# Patient Record
Sex: Male | Born: 1935 | Race: White | Hispanic: No | Marital: Married | Smoking: Never smoker
Health system: Southern US, Community
[De-identification: ages and names within clinical notes are randomized; demographics above are authoritative.]

## PROBLEM LIST (undated history)

## (undated) DIAGNOSIS — Z87438 Personal history of other diseases of male genital organs: Secondary | ICD-10-CM

## (undated) DIAGNOSIS — N281 Cyst of kidney, acquired: Secondary | ICD-10-CM

## (undated) DIAGNOSIS — I2510 Atherosclerotic heart disease of native coronary artery without angina pectoris: Secondary | ICD-10-CM

## (undated) DIAGNOSIS — I480 Paroxysmal atrial fibrillation: Secondary | ICD-10-CM

## (undated) DIAGNOSIS — E785 Hyperlipidemia, unspecified: Secondary | ICD-10-CM

## (undated) DIAGNOSIS — I639 Cerebral infarction, unspecified: Secondary | ICD-10-CM

## (undated) DIAGNOSIS — H8309 Labyrinthitis, unspecified ear: Secondary | ICD-10-CM

## (undated) DIAGNOSIS — F3289 Other specified depressive episodes: Secondary | ICD-10-CM

## (undated) DIAGNOSIS — M1710 Unilateral primary osteoarthritis, unspecified knee: Secondary | ICD-10-CM

## (undated) DIAGNOSIS — F329 Major depressive disorder, single episode, unspecified: Secondary | ICD-10-CM

## (undated) DIAGNOSIS — I10 Essential (primary) hypertension: Secondary | ICD-10-CM

## (undated) DIAGNOSIS — IMO0002 Reserved for concepts with insufficient information to code with codable children: Secondary | ICD-10-CM

## (undated) DIAGNOSIS — K219 Gastro-esophageal reflux disease without esophagitis: Secondary | ICD-10-CM

## (undated) DIAGNOSIS — R51 Headache: Secondary | ICD-10-CM

## (undated) DIAGNOSIS — C342 Malignant neoplasm of middle lobe, bronchus or lung: Secondary | ICD-10-CM

## (undated) DIAGNOSIS — R519 Headache, unspecified: Secondary | ICD-10-CM

## (undated) HISTORY — DX: Essential (primary) hypertension: I10

## (undated) HISTORY — DX: Atherosclerotic heart disease of native coronary artery without angina pectoris: I25.10

## (undated) HISTORY — PX: CATARACT EXTRACTION, BILATERAL: SHX1313

## (undated) HISTORY — PX: INGUINAL HERNIA REPAIR: SHX194

## (undated) HISTORY — DX: Major depressive disorder, single episode, unspecified: F32.9

## (undated) HISTORY — DX: Unilateral primary osteoarthritis, unspecified knee: M17.10

## (undated) HISTORY — PX: OTHER SURGICAL HISTORY: SHX169

## (undated) HISTORY — DX: Personal history of other diseases of male genital organs: Z87.438

## (undated) HISTORY — DX: Gastro-esophageal reflux disease without esophagitis: K21.9

## (undated) HISTORY — DX: Paroxysmal atrial fibrillation: I48.0

## (undated) HISTORY — PX: EYE SURGERY: SHX253

## (undated) HISTORY — DX: Labyrinthitis, unspecified ear: H83.09

## (undated) HISTORY — PX: BUNIONECTOMY: SHX129

## (undated) HISTORY — DX: Malignant neoplasm of middle lobe, bronchus or lung: C34.2

## (undated) HISTORY — DX: Hyperlipidemia, unspecified: E78.5

## (undated) HISTORY — DX: Reserved for concepts with insufficient information to code with codable children: IMO0002

## (undated) HISTORY — DX: Other specified depressive episodes: F32.89

---

## 1998-05-07 ENCOUNTER — Encounter: Payer: Self-pay | Admitting: Emergency Medicine

## 1998-05-07 ENCOUNTER — Observation Stay (HOSPITAL_COMMUNITY): Admission: EM | Admit: 1998-05-07 | Discharge: 1998-05-08 | Payer: Self-pay | Admitting: Emergency Medicine

## 2002-11-21 ENCOUNTER — Encounter: Payer: Self-pay | Admitting: Gastroenterology

## 2002-11-21 ENCOUNTER — Ambulatory Visit (HOSPITAL_COMMUNITY): Admission: RE | Admit: 2002-11-21 | Discharge: 2002-11-21 | Payer: Self-pay | Admitting: Gastroenterology

## 2004-01-02 ENCOUNTER — Ambulatory Visit: Payer: Self-pay

## 2004-01-30 ENCOUNTER — Ambulatory Visit: Payer: Self-pay | Admitting: *Deleted

## 2004-02-27 ENCOUNTER — Ambulatory Visit: Payer: Self-pay | Admitting: Cardiology

## 2004-03-26 ENCOUNTER — Ambulatory Visit: Payer: Self-pay | Admitting: Cardiology

## 2004-04-23 ENCOUNTER — Ambulatory Visit: Payer: Self-pay | Admitting: Cardiology

## 2004-04-23 ENCOUNTER — Ambulatory Visit: Payer: Self-pay | Admitting: Internal Medicine

## 2004-05-07 ENCOUNTER — Ambulatory Visit: Payer: Self-pay | Admitting: *Deleted

## 2004-05-08 ENCOUNTER — Ambulatory Visit: Payer: Self-pay | Admitting: Gastroenterology

## 2004-05-14 ENCOUNTER — Ambulatory Visit: Payer: Self-pay | Admitting: Gastroenterology

## 2004-05-14 ENCOUNTER — Ambulatory Visit (HOSPITAL_COMMUNITY): Admission: RE | Admit: 2004-05-14 | Discharge: 2004-05-14 | Payer: Self-pay | Admitting: Gastroenterology

## 2004-05-14 HISTORY — PX: ESOPHAGOGASTRODUODENOSCOPY: SHX1529

## 2004-06-04 ENCOUNTER — Ambulatory Visit: Payer: Self-pay | Admitting: Internal Medicine

## 2004-07-02 ENCOUNTER — Ambulatory Visit: Payer: Self-pay | Admitting: Cardiology

## 2004-07-16 ENCOUNTER — Ambulatory Visit: Payer: Self-pay | Admitting: *Deleted

## 2004-08-06 ENCOUNTER — Ambulatory Visit: Payer: Self-pay | Admitting: Cardiology

## 2004-09-03 ENCOUNTER — Ambulatory Visit: Payer: Self-pay | Admitting: Cardiology

## 2004-10-01 ENCOUNTER — Ambulatory Visit: Payer: Self-pay | Admitting: Cardiology

## 2004-10-17 ENCOUNTER — Ambulatory Visit: Payer: Self-pay | Admitting: Internal Medicine

## 2004-10-24 ENCOUNTER — Ambulatory Visit: Payer: Self-pay | Admitting: Cardiology

## 2004-11-14 ENCOUNTER — Ambulatory Visit: Payer: Self-pay | Admitting: Cardiology

## 2004-12-04 ENCOUNTER — Ambulatory Visit: Payer: Self-pay | Admitting: Internal Medicine

## 2004-12-12 ENCOUNTER — Ambulatory Visit: Payer: Self-pay | Admitting: Cardiovascular Disease

## 2005-01-08 ENCOUNTER — Ambulatory Visit: Payer: Self-pay | Admitting: Internal Medicine

## 2005-02-05 ENCOUNTER — Ambulatory Visit: Payer: Self-pay | Admitting: *Deleted

## 2005-03-05 ENCOUNTER — Ambulatory Visit: Payer: Self-pay | Admitting: Cardiology

## 2005-04-02 ENCOUNTER — Ambulatory Visit: Payer: Self-pay | Admitting: *Deleted

## 2005-04-14 ENCOUNTER — Ambulatory Visit: Payer: Self-pay | Admitting: Internal Medicine

## 2005-04-30 ENCOUNTER — Ambulatory Visit: Payer: Self-pay | Admitting: Cardiology

## 2005-05-28 ENCOUNTER — Ambulatory Visit: Payer: Self-pay | Admitting: Cardiology

## 2005-06-25 ENCOUNTER — Ambulatory Visit: Payer: Self-pay | Admitting: Cardiology

## 2005-07-17 ENCOUNTER — Ambulatory Visit: Payer: Self-pay | Admitting: Cardiology

## 2005-08-12 ENCOUNTER — Ambulatory Visit: Payer: Self-pay | Admitting: *Deleted

## 2005-09-09 ENCOUNTER — Ambulatory Visit: Payer: Self-pay | Admitting: *Deleted

## 2005-10-05 ENCOUNTER — Ambulatory Visit: Payer: Self-pay | Admitting: Internal Medicine

## 2005-10-07 ENCOUNTER — Ambulatory Visit: Payer: Self-pay | Admitting: *Deleted

## 2005-11-04 ENCOUNTER — Ambulatory Visit: Payer: Self-pay | Admitting: Cardiovascular Disease

## 2005-11-27 ENCOUNTER — Ambulatory Visit: Payer: Self-pay | Admitting: Internal Medicine

## 2005-12-02 ENCOUNTER — Ambulatory Visit: Payer: Self-pay | Admitting: Cardiology

## 2005-12-30 ENCOUNTER — Ambulatory Visit: Payer: Self-pay | Admitting: Cardiology

## 2006-01-20 ENCOUNTER — Ambulatory Visit: Payer: Self-pay | Admitting: Cardiovascular Disease

## 2006-02-10 ENCOUNTER — Ambulatory Visit: Payer: Self-pay | Admitting: *Deleted

## 2006-03-10 ENCOUNTER — Ambulatory Visit: Payer: Self-pay | Admitting: Cardiology

## 2006-03-24 ENCOUNTER — Ambulatory Visit: Payer: Self-pay | Admitting: Internal Medicine

## 2006-04-21 ENCOUNTER — Ambulatory Visit: Payer: Self-pay | Admitting: Internal Medicine

## 2006-04-22 ENCOUNTER — Ambulatory Visit: Payer: Self-pay | Admitting: Internal Medicine

## 2006-04-22 LAB — CONVERTED CEMR LAB
AST: 24 units/L (ref 0–37)
BUN: 12 mg/dL (ref 6–23)
CO2: 30 meq/L (ref 19–32)
Calcium: 8.9 mg/dL (ref 8.4–10.5)
GFR calc Af Amer: 85 mL/min
GFR calc non Af Amer: 70 mL/min
LDL Cholesterol: 90 mg/dL (ref 0–99)
Potassium: 4.1 meq/L (ref 3.5–5.1)
Total CHOL/HDL Ratio: 3.6
Triglycerides: 76 mg/dL (ref 0–149)
VLDL: 15 mg/dL (ref 0–40)

## 2006-05-19 ENCOUNTER — Ambulatory Visit: Payer: Self-pay | Admitting: Cardiovascular Disease

## 2006-06-16 ENCOUNTER — Ambulatory Visit: Payer: Self-pay | Admitting: *Deleted

## 2006-07-14 ENCOUNTER — Ambulatory Visit: Payer: Self-pay | Admitting: Cardiology

## 2006-08-11 ENCOUNTER — Ambulatory Visit: Payer: Self-pay | Admitting: Cardiology

## 2006-09-08 ENCOUNTER — Ambulatory Visit: Payer: Self-pay | Admitting: Internal Medicine

## 2006-10-06 ENCOUNTER — Ambulatory Visit: Payer: Self-pay | Admitting: Internal Medicine

## 2006-10-07 ENCOUNTER — Ambulatory Visit: Payer: Self-pay | Admitting: Internal Medicine

## 2006-11-03 ENCOUNTER — Ambulatory Visit: Payer: Self-pay | Admitting: Cardiology

## 2006-12-01 ENCOUNTER — Ambulatory Visit: Payer: Self-pay | Admitting: Internal Medicine

## 2006-12-02 ENCOUNTER — Ambulatory Visit: Payer: Self-pay | Admitting: Internal Medicine

## 2006-12-29 ENCOUNTER — Ambulatory Visit: Payer: Self-pay | Admitting: Cardiology

## 2007-01-26 ENCOUNTER — Ambulatory Visit: Payer: Self-pay | Admitting: Internal Medicine

## 2007-02-23 ENCOUNTER — Ambulatory Visit: Payer: Self-pay | Admitting: Cardiology

## 2007-03-21 ENCOUNTER — Encounter: Payer: Self-pay | Admitting: Internal Medicine

## 2007-03-21 DIAGNOSIS — I10 Essential (primary) hypertension: Secondary | ICD-10-CM | POA: Insufficient documentation

## 2007-03-21 DIAGNOSIS — I679 Cerebrovascular disease, unspecified: Secondary | ICD-10-CM | POA: Insufficient documentation

## 2007-03-21 DIAGNOSIS — F4321 Adjustment disorder with depressed mood: Secondary | ICD-10-CM | POA: Insufficient documentation

## 2007-03-21 DIAGNOSIS — E785 Hyperlipidemia, unspecified: Secondary | ICD-10-CM | POA: Insufficient documentation

## 2007-03-21 DIAGNOSIS — F329 Major depressive disorder, single episode, unspecified: Secondary | ICD-10-CM

## 2007-03-21 DIAGNOSIS — I2510 Atherosclerotic heart disease of native coronary artery without angina pectoris: Secondary | ICD-10-CM

## 2007-03-23 ENCOUNTER — Encounter: Payer: Self-pay | Admitting: Internal Medicine

## 2007-03-23 ENCOUNTER — Ambulatory Visit: Payer: Self-pay | Admitting: Cardiology

## 2007-04-20 ENCOUNTER — Ambulatory Visit: Payer: Self-pay | Admitting: Internal Medicine

## 2007-05-06 ENCOUNTER — Ambulatory Visit: Payer: Self-pay | Admitting: Internal Medicine

## 2007-05-06 DIAGNOSIS — K219 Gastro-esophageal reflux disease without esophagitis: Secondary | ICD-10-CM | POA: Insufficient documentation

## 2007-05-06 LAB — CONVERTED CEMR LAB
ALT: 23 units/L (ref 0–53)
AST: 23 units/L (ref 0–37)
Alkaline Phosphatase: 39 units/L (ref 39–117)
BUN: 16 mg/dL (ref 6–23)
CO2: 28 meq/L (ref 19–32)
Calcium: 9 mg/dL (ref 8.4–10.5)
Chloride: 109 meq/L (ref 96–112)
GFR calc Af Amer: 85 mL/min
GFR calc non Af Amer: 70 mL/min
Total CHOL/HDL Ratio: 3.8
Total Protein: 6.8 g/dL (ref 6.0–8.3)

## 2007-05-08 ENCOUNTER — Encounter: Payer: Self-pay | Admitting: Internal Medicine

## 2007-05-18 ENCOUNTER — Ambulatory Visit: Payer: Self-pay | Admitting: Cardiology

## 2007-06-15 ENCOUNTER — Ambulatory Visit: Payer: Self-pay | Admitting: Cardiology

## 2007-06-27 ENCOUNTER — Ambulatory Visit: Payer: Self-pay | Admitting: Internal Medicine

## 2007-06-27 DIAGNOSIS — H8309 Labyrinthitis, unspecified ear: Secondary | ICD-10-CM | POA: Insufficient documentation

## 2007-06-29 ENCOUNTER — Telehealth: Payer: Self-pay | Admitting: Internal Medicine

## 2007-07-13 ENCOUNTER — Ambulatory Visit: Payer: Self-pay | Admitting: Cardiology

## 2007-07-26 ENCOUNTER — Encounter: Payer: Self-pay | Admitting: Internal Medicine

## 2007-08-10 ENCOUNTER — Ambulatory Visit: Payer: Self-pay | Admitting: Internal Medicine

## 2007-09-07 ENCOUNTER — Ambulatory Visit: Payer: Self-pay | Admitting: Cardiology

## 2007-09-27 ENCOUNTER — Ambulatory Visit: Payer: Self-pay | Admitting: Internal Medicine

## 2007-09-29 ENCOUNTER — Telehealth: Payer: Self-pay | Admitting: Internal Medicine

## 2007-09-29 ENCOUNTER — Encounter: Payer: Self-pay | Admitting: Internal Medicine

## 2007-10-05 ENCOUNTER — Ambulatory Visit: Payer: Self-pay | Admitting: Cardiology

## 2007-11-02 ENCOUNTER — Ambulatory Visit: Payer: Self-pay | Admitting: Internal Medicine

## 2007-11-29 ENCOUNTER — Ambulatory Visit: Payer: Self-pay | Admitting: Internal Medicine

## 2007-11-30 ENCOUNTER — Ambulatory Visit: Payer: Self-pay | Admitting: Internal Medicine

## 2007-12-28 ENCOUNTER — Ambulatory Visit: Payer: Self-pay | Admitting: Cardiovascular Disease

## 2008-01-25 ENCOUNTER — Ambulatory Visit: Payer: Self-pay | Admitting: Internal Medicine

## 2008-01-27 ENCOUNTER — Telehealth: Payer: Self-pay | Admitting: Internal Medicine

## 2008-02-22 ENCOUNTER — Ambulatory Visit: Payer: Self-pay | Admitting: Internal Medicine

## 2008-03-14 ENCOUNTER — Ambulatory Visit: Payer: Self-pay | Admitting: Internal Medicine

## 2008-03-27 ENCOUNTER — Telehealth: Payer: Self-pay | Admitting: Internal Medicine

## 2008-03-27 ENCOUNTER — Ambulatory Visit: Payer: Self-pay | Admitting: Internal Medicine

## 2008-03-27 LAB — CONVERTED CEMR LAB
Calcium: 9.2 mg/dL (ref 8.4–10.5)
GFR calc Af Amer: 94 mL/min
GFR calc non Af Amer: 78 mL/min
HDL: 37.5 mg/dL — ABNORMAL LOW (ref >39.0)
Sodium: 143 meq/L (ref 135–145)
TSH: 2.42 microintl units/mL (ref 0.35–5.50)
Total CHOL/HDL Ratio: 3.7
VLDL: 14 mg/dL (ref 0–40)

## 2008-03-28 ENCOUNTER — Telehealth: Payer: Self-pay | Admitting: Internal Medicine

## 2008-03-28 DIAGNOSIS — M25561 Pain in right knee: Secondary | ICD-10-CM | POA: Insufficient documentation

## 2008-03-28 DIAGNOSIS — M1710 Unilateral primary osteoarthritis, unspecified knee: Secondary | ICD-10-CM | POA: Insufficient documentation

## 2008-04-10 ENCOUNTER — Telehealth: Payer: Self-pay | Admitting: Internal Medicine

## 2008-04-11 ENCOUNTER — Ambulatory Visit: Payer: Self-pay | Admitting: Cardiology

## 2008-05-09 ENCOUNTER — Ambulatory Visit: Payer: Self-pay | Admitting: Cardiology

## 2008-05-18 ENCOUNTER — Telehealth: Payer: Self-pay | Admitting: Internal Medicine

## 2008-06-06 ENCOUNTER — Ambulatory Visit: Payer: Self-pay | Admitting: Internal Medicine

## 2008-06-20 ENCOUNTER — Ambulatory Visit: Payer: Self-pay | Admitting: Cardiology

## 2008-07-04 ENCOUNTER — Encounter: Payer: Self-pay | Admitting: Internal Medicine

## 2008-07-04 ENCOUNTER — Telehealth: Payer: Self-pay | Admitting: Internal Medicine

## 2008-07-10 ENCOUNTER — Telehealth (INDEPENDENT_AMBULATORY_CARE_PROVIDER_SITE_OTHER): Payer: Self-pay | Admitting: Cardiology

## 2008-07-18 ENCOUNTER — Ambulatory Visit: Payer: Self-pay | Admitting: Internal Medicine

## 2008-07-24 ENCOUNTER — Encounter: Payer: Self-pay | Admitting: *Deleted

## 2008-07-30 ENCOUNTER — Ambulatory Visit: Payer: Self-pay | Admitting: Cardiovascular Disease

## 2008-07-30 LAB — CONVERTED CEMR LAB: Protime: 17.5

## 2008-07-31 ENCOUNTER — Telehealth (INDEPENDENT_AMBULATORY_CARE_PROVIDER_SITE_OTHER): Payer: Self-pay | Admitting: *Deleted

## 2008-07-31 ENCOUNTER — Encounter: Admission: RE | Admit: 2008-07-31 | Discharge: 2008-07-31 | Payer: Self-pay | Admitting: Orthopedic Surgery

## 2008-08-01 ENCOUNTER — Telehealth: Payer: Self-pay | Admitting: Internal Medicine

## 2008-08-02 ENCOUNTER — Ambulatory Visit (HOSPITAL_COMMUNITY): Admission: RE | Admit: 2008-08-02 | Discharge: 2008-08-02 | Payer: Self-pay | Admitting: Thoracic Surgery

## 2008-08-03 ENCOUNTER — Ambulatory Visit (HOSPITAL_COMMUNITY): Admission: RE | Admit: 2008-08-03 | Discharge: 2008-08-04 | Payer: Self-pay | Admitting: Orthopedic Surgery

## 2008-08-06 ENCOUNTER — Ambulatory Visit: Payer: Self-pay | Admitting: Cardiology

## 2008-08-06 DIAGNOSIS — G450 Vertebro-basilar artery syndrome: Secondary | ICD-10-CM

## 2008-08-07 ENCOUNTER — Encounter: Payer: Self-pay | Admitting: Internal Medicine

## 2008-08-07 ENCOUNTER — Ambulatory Visit: Payer: Self-pay | Admitting: Thoracic Surgery

## 2008-08-09 ENCOUNTER — Ambulatory Visit: Payer: Self-pay | Admitting: Internal Medicine

## 2008-08-15 ENCOUNTER — Ambulatory Visit: Payer: Self-pay | Admitting: Internal Medicine

## 2008-08-15 ENCOUNTER — Encounter (INDEPENDENT_AMBULATORY_CARE_PROVIDER_SITE_OTHER): Payer: Self-pay | Admitting: *Deleted

## 2008-08-15 LAB — CONVERTED CEMR LAB: Prothrombin Time: 16.4 s

## 2008-08-23 ENCOUNTER — Ambulatory Visit: Payer: Self-pay | Admitting: Cardiology

## 2008-08-23 LAB — CONVERTED CEMR LAB
POC INR: 2.4
Prothrombin Time: 19 s

## 2008-08-27 ENCOUNTER — Inpatient Hospital Stay (HOSPITAL_COMMUNITY): Admission: AD | Admit: 2008-08-27 | Discharge: 2008-09-03 | Payer: Self-pay | Admitting: Thoracic Surgery

## 2008-08-28 ENCOUNTER — Encounter: Payer: Self-pay | Admitting: Thoracic Surgery

## 2008-08-28 ENCOUNTER — Ambulatory Visit: Payer: Self-pay | Admitting: Thoracic Surgery

## 2008-08-29 ENCOUNTER — Encounter: Payer: Self-pay | Admitting: *Deleted

## 2008-09-05 ENCOUNTER — Ambulatory Visit: Payer: Self-pay | Admitting: Internal Medicine

## 2008-09-05 LAB — CONVERTED CEMR LAB
POC INR: 1.3
Prothrombin Time: 14 s

## 2008-09-12 ENCOUNTER — Ambulatory Visit: Payer: Self-pay | Admitting: Thoracic Surgery

## 2008-09-12 ENCOUNTER — Ambulatory Visit: Payer: Self-pay | Admitting: Internal Medicine

## 2008-09-12 ENCOUNTER — Encounter: Admission: RE | Admit: 2008-09-12 | Discharge: 2008-09-12 | Payer: Self-pay | Admitting: Thoracic Surgery

## 2008-09-25 ENCOUNTER — Ambulatory Visit: Payer: Self-pay | Admitting: Cardiology

## 2008-09-25 LAB — CONVERTED CEMR LAB: Prothrombin Time: 22.3 s

## 2008-09-26 ENCOUNTER — Ambulatory Visit: Payer: Self-pay | Admitting: Thoracic Surgery

## 2008-09-26 ENCOUNTER — Encounter: Admission: RE | Admit: 2008-09-26 | Discharge: 2008-09-26 | Payer: Self-pay | Admitting: Thoracic Surgery

## 2008-10-01 ENCOUNTER — Encounter: Payer: Self-pay | Admitting: Internal Medicine

## 2008-10-01 LAB — COMPREHENSIVE METABOLIC PANEL
Albumin: 3.9 g/dL (ref 3.5–5.2)
Alkaline Phosphatase: 49 U/L (ref 39–117)
BUN: 14 mg/dL (ref 6–23)
CO2: 25 mEq/L (ref 19–32)
Calcium: 8.7 mg/dL (ref 8.4–10.5)
Chloride: 105 mEq/L (ref 96–112)
Glucose, Bld: 108 mg/dL — ABNORMAL HIGH (ref 70–99)
Potassium: 4.2 mEq/L (ref 3.5–5.3)
Sodium: 139 mEq/L (ref 135–145)
Total Protein: 6.8 g/dL (ref 6.0–8.3)

## 2008-10-01 LAB — CBC WITH DIFFERENTIAL/PLATELET
BASO%: 0.4 % (ref 0.0–2.0)
Basophils Absolute: 0 10*3/uL (ref 0.0–0.1)
HCT: 41.7 % (ref 38.4–49.9)
HGB: 13.9 g/dL (ref 13.0–17.1)
MONO#: 0.5 10*3/uL (ref 0.1–0.9)
NEUT%: 67.7 % (ref 39.0–75.0)
RDW: 14.7 % — ABNORMAL HIGH (ref 11.0–14.6)
WBC: 7.8 10*3/uL (ref 4.0–10.3)
lymph#: 1.9 10*3/uL (ref 0.9–3.3)

## 2008-10-02 ENCOUNTER — Telehealth: Payer: Self-pay | Admitting: Internal Medicine

## 2008-10-02 DIAGNOSIS — I4891 Unspecified atrial fibrillation: Secondary | ICD-10-CM

## 2008-10-03 ENCOUNTER — Telehealth (INDEPENDENT_AMBULATORY_CARE_PROVIDER_SITE_OTHER): Payer: Self-pay | Admitting: *Deleted

## 2008-10-09 ENCOUNTER — Ambulatory Visit: Payer: Self-pay | Admitting: Internal Medicine

## 2008-10-09 ENCOUNTER — Ambulatory Visit: Payer: Self-pay | Admitting: Cardiovascular Disease

## 2008-10-09 DIAGNOSIS — N400 Enlarged prostate without lower urinary tract symptoms: Secondary | ICD-10-CM

## 2008-10-09 DIAGNOSIS — C3491 Malignant neoplasm of unspecified part of right bronchus or lung: Secondary | ICD-10-CM

## 2008-10-09 DIAGNOSIS — Z9889 Other specified postprocedural states: Secondary | ICD-10-CM | POA: Insufficient documentation

## 2008-10-19 ENCOUNTER — Ambulatory Visit: Payer: Self-pay | Admitting: Internal Medicine

## 2008-10-23 ENCOUNTER — Ambulatory Visit: Payer: Self-pay | Admitting: Cardiology

## 2008-10-31 ENCOUNTER — Encounter: Payer: Self-pay | Admitting: Internal Medicine

## 2008-10-31 ENCOUNTER — Encounter: Admission: RE | Admit: 2008-10-31 | Discharge: 2008-10-31 | Payer: Self-pay | Admitting: Thoracic Surgery

## 2008-10-31 ENCOUNTER — Ambulatory Visit: Payer: Self-pay | Admitting: Thoracic Surgery

## 2008-11-13 ENCOUNTER — Ambulatory Visit: Payer: Self-pay | Admitting: Cardiology

## 2008-11-13 LAB — CONVERTED CEMR LAB: POC INR: 2.9

## 2008-12-04 ENCOUNTER — Ambulatory Visit: Payer: Self-pay | Admitting: Cardiology

## 2008-12-04 LAB — CONVERTED CEMR LAB: POC INR: 2.1

## 2008-12-05 ENCOUNTER — Ambulatory Visit: Payer: Self-pay | Admitting: Internal Medicine

## 2008-12-20 ENCOUNTER — Telehealth: Payer: Self-pay | Admitting: Internal Medicine

## 2008-12-25 ENCOUNTER — Ambulatory Visit: Payer: Self-pay | Admitting: Internal Medicine

## 2009-01-02 ENCOUNTER — Encounter: Admission: RE | Admit: 2009-01-02 | Discharge: 2009-01-02 | Payer: Self-pay | Admitting: Thoracic Surgery

## 2009-01-02 ENCOUNTER — Ambulatory Visit: Payer: Self-pay | Admitting: Thoracic Surgery

## 2009-01-22 ENCOUNTER — Ambulatory Visit: Payer: Self-pay | Admitting: Cardiology

## 2009-01-22 LAB — CONVERTED CEMR LAB: POC INR: 2.3

## 2009-02-19 ENCOUNTER — Ambulatory Visit: Payer: Self-pay | Admitting: Cardiology

## 2009-02-19 LAB — CONVERTED CEMR LAB: POC INR: 1.9

## 2009-03-12 ENCOUNTER — Encounter (INDEPENDENT_AMBULATORY_CARE_PROVIDER_SITE_OTHER): Payer: Self-pay | Admitting: Cardiology

## 2009-03-12 ENCOUNTER — Ambulatory Visit: Payer: Self-pay | Admitting: Cardiology

## 2009-03-27 ENCOUNTER — Ambulatory Visit: Payer: Self-pay | Admitting: Internal Medicine

## 2009-03-29 ENCOUNTER — Encounter: Payer: Self-pay | Admitting: Internal Medicine

## 2009-03-29 ENCOUNTER — Ambulatory Visit (HOSPITAL_COMMUNITY): Admission: RE | Admit: 2009-03-29 | Discharge: 2009-03-29 | Payer: Self-pay | Admitting: Internal Medicine

## 2009-03-29 LAB — COMPREHENSIVE METABOLIC PANEL
ALT: 15 U/L (ref 0–53)
Albumin: 3.8 g/dL (ref 3.5–5.2)
CO2: 28 mEq/L (ref 19–32)
Calcium: 8.5 mg/dL (ref 8.4–10.5)
Chloride: 103 mEq/L (ref 96–112)
Glucose, Bld: 105 mg/dL — ABNORMAL HIGH (ref 70–99)
Sodium: 138 mEq/L (ref 135–145)
Total Bilirubin: 0.9 mg/dL (ref 0.3–1.2)
Total Protein: 7.1 g/dL (ref 6.0–8.3)

## 2009-03-29 LAB — CBC WITH DIFFERENTIAL/PLATELET
BASO%: 0.3 % (ref 0.0–2.0)
Eosinophils Absolute: 0.1 10*3/uL (ref 0.0–0.5)
HCT: 41.4 % (ref 38.4–49.9)
LYMPH%: 21.6 % (ref 14.0–49.0)
MCHC: 33.6 g/dL (ref 32.0–36.0)
MONO#: 0.7 10*3/uL (ref 0.1–0.9)
NEUT#: 5.7 10*3/uL (ref 1.5–6.5)
Platelets: 126 10*3/uL — ABNORMAL LOW (ref 140–400)
RBC: 4.46 10*6/uL (ref 4.20–5.82)
WBC: 8.4 10*3/uL (ref 4.0–10.3)
lymph#: 1.8 10*3/uL (ref 0.9–3.3)

## 2009-04-02 ENCOUNTER — Ambulatory Visit: Payer: Self-pay | Admitting: Cardiology

## 2009-04-02 LAB — CONVERTED CEMR LAB: POC INR: 2.1

## 2009-04-03 ENCOUNTER — Encounter: Payer: Self-pay | Admitting: Internal Medicine

## 2009-04-10 ENCOUNTER — Ambulatory Visit: Payer: Self-pay | Admitting: Thoracic Surgery

## 2009-04-17 ENCOUNTER — Telehealth: Payer: Self-pay | Admitting: Internal Medicine

## 2009-04-19 ENCOUNTER — Encounter: Payer: Self-pay | Admitting: Internal Medicine

## 2009-04-22 ENCOUNTER — Ambulatory Visit: Payer: Self-pay | Admitting: Internal Medicine

## 2009-04-22 LAB — CONVERTED CEMR LAB
Cholesterol: 136 mg/dL (ref 0–200)
PSA: 2.06 ng/mL (ref 0.10–4.00)
TSH: 2.67 microintl units/mL (ref 0.35–5.50)

## 2009-04-23 ENCOUNTER — Encounter: Payer: Self-pay | Admitting: Internal Medicine

## 2009-04-24 ENCOUNTER — Telehealth (INDEPENDENT_AMBULATORY_CARE_PROVIDER_SITE_OTHER): Payer: Self-pay | Admitting: *Deleted

## 2009-04-30 ENCOUNTER — Ambulatory Visit: Payer: Self-pay | Admitting: Cardiology

## 2009-04-30 LAB — CONVERTED CEMR LAB: POC INR: 2.5

## 2009-05-28 ENCOUNTER — Ambulatory Visit: Payer: Self-pay | Admitting: Internal Medicine

## 2009-06-25 ENCOUNTER — Ambulatory Visit: Payer: Self-pay | Admitting: Cardiology

## 2009-06-25 LAB — CONVERTED CEMR LAB: POC INR: 2.1

## 2009-07-24 ENCOUNTER — Ambulatory Visit: Payer: Self-pay | Admitting: Cardiology

## 2009-07-24 LAB — CONVERTED CEMR LAB: POC INR: 2.1

## 2009-08-16 ENCOUNTER — Telehealth: Payer: Self-pay | Admitting: Internal Medicine

## 2009-08-21 ENCOUNTER — Ambulatory Visit: Payer: Self-pay | Admitting: Cardiology

## 2009-09-05 ENCOUNTER — Ambulatory Visit: Payer: Self-pay | Admitting: Internal Medicine

## 2009-09-06 ENCOUNTER — Encounter: Payer: Self-pay | Admitting: Internal Medicine

## 2009-09-09 ENCOUNTER — Ambulatory Visit: Payer: Self-pay | Admitting: Cardiology

## 2009-09-09 ENCOUNTER — Ambulatory Visit: Payer: Self-pay | Admitting: Internal Medicine

## 2009-09-09 ENCOUNTER — Telehealth (INDEPENDENT_AMBULATORY_CARE_PROVIDER_SITE_OTHER): Payer: Self-pay | Admitting: *Deleted

## 2009-09-10 ENCOUNTER — Encounter (HOSPITAL_COMMUNITY): Admission: RE | Admit: 2009-09-10 | Discharge: 2009-11-12 | Payer: Self-pay | Admitting: Internal Medicine

## 2009-09-10 ENCOUNTER — Ambulatory Visit: Payer: Self-pay

## 2009-09-10 ENCOUNTER — Encounter: Payer: Self-pay | Admitting: Cardiology

## 2009-09-10 ENCOUNTER — Ambulatory Visit: Payer: Self-pay | Admitting: Cardiology

## 2009-09-19 ENCOUNTER — Encounter: Payer: Self-pay | Admitting: Internal Medicine

## 2009-09-19 ENCOUNTER — Ambulatory Visit: Admission: RE | Admit: 2009-09-19 | Discharge: 2009-09-19 | Payer: Self-pay | Admitting: Internal Medicine

## 2009-09-23 HISTORY — PX: OTHER SURGICAL HISTORY: SHX169

## 2009-09-25 ENCOUNTER — Ambulatory Visit: Payer: Self-pay | Admitting: Internal Medicine

## 2009-09-27 ENCOUNTER — Ambulatory Visit (HOSPITAL_COMMUNITY): Admission: RE | Admit: 2009-09-27 | Discharge: 2009-09-27 | Payer: Self-pay | Admitting: Internal Medicine

## 2009-09-27 ENCOUNTER — Ambulatory Visit: Payer: Self-pay | Admitting: Cardiovascular Disease

## 2009-09-27 LAB — COMPREHENSIVE METABOLIC PANEL
CO2: 29 mEq/L (ref 19–32)
Calcium: 8.9 mg/dL (ref 8.4–10.5)
Chloride: 107 mEq/L (ref 96–112)
Creatinine, Ser: 1.07 mg/dL (ref 0.40–1.50)
Glucose, Bld: 88 mg/dL (ref 70–99)
Total Bilirubin: 0.8 mg/dL (ref 0.3–1.2)
Total Protein: 6.1 g/dL (ref 6.0–8.3)

## 2009-09-27 LAB — CBC WITH DIFFERENTIAL/PLATELET
Eosinophils Absolute: 0 10*3/uL (ref 0.0–0.5)
HCT: 42.5 % (ref 38.4–49.9)
HGB: 14.2 g/dL (ref 13.0–17.1)
LYMPH%: 38.1 % (ref 14.0–49.0)
MONO#: 0.5 10*3/uL (ref 0.1–0.9)
NEUT#: 3.7 10*3/uL (ref 1.5–6.5)
NEUT%: 53.5 % (ref 39.0–75.0)
Platelets: 144 10*3/uL (ref 140–400)
WBC: 6.9 10*3/uL (ref 4.0–10.3)
lymph#: 2.6 10*3/uL (ref 0.9–3.3)

## 2009-10-01 ENCOUNTER — Encounter: Payer: Self-pay | Admitting: Internal Medicine

## 2009-10-02 ENCOUNTER — Ambulatory Visit: Payer: Self-pay | Admitting: Thoracic Surgery

## 2009-10-18 ENCOUNTER — Ambulatory Visit: Payer: Self-pay | Admitting: Cardiology

## 2009-10-18 LAB — CONVERTED CEMR LAB: POC INR: 2.1

## 2009-10-21 ENCOUNTER — Ambulatory Visit: Payer: Self-pay | Admitting: Internal Medicine

## 2009-10-21 ENCOUNTER — Telehealth: Payer: Self-pay | Admitting: Internal Medicine

## 2009-10-21 ENCOUNTER — Inpatient Hospital Stay (HOSPITAL_COMMUNITY): Admission: RE | Admit: 2009-10-21 | Discharge: 2009-10-24 | Payer: Self-pay | Admitting: Orthopedic Surgery

## 2009-10-25 ENCOUNTER — Ambulatory Visit: Payer: Self-pay | Admitting: Internal Medicine

## 2009-10-25 ENCOUNTER — Telehealth: Payer: Self-pay | Admitting: Family Medicine

## 2009-10-25 ENCOUNTER — Encounter: Payer: Self-pay | Admitting: Cardiology

## 2009-10-25 ENCOUNTER — Telehealth: Payer: Self-pay | Admitting: Internal Medicine

## 2009-10-25 DIAGNOSIS — R197 Diarrhea, unspecified: Secondary | ICD-10-CM

## 2009-10-25 LAB — CONVERTED CEMR LAB: POC INR: 1.8

## 2009-10-29 ENCOUNTER — Telehealth (INDEPENDENT_AMBULATORY_CARE_PROVIDER_SITE_OTHER): Payer: Self-pay | Admitting: *Deleted

## 2009-10-29 ENCOUNTER — Encounter: Payer: Self-pay | Admitting: Cardiovascular Disease

## 2009-10-29 ENCOUNTER — Telehealth: Payer: Self-pay | Admitting: Internal Medicine

## 2009-10-29 LAB — CONVERTED CEMR LAB
POC INR: 4.8
Prothrombin Time: 47.8 s

## 2009-11-01 ENCOUNTER — Encounter: Payer: Self-pay | Admitting: Internal Medicine

## 2009-11-01 LAB — CONVERTED CEMR LAB: Prothrombin Time: 38.8 s

## 2009-11-04 ENCOUNTER — Telehealth: Payer: Self-pay | Admitting: Internal Medicine

## 2009-11-04 ENCOUNTER — Encounter: Payer: Self-pay | Admitting: Internal Medicine

## 2009-11-04 LAB — CONVERTED CEMR LAB: Prothrombin Time: 31 s

## 2009-11-05 ENCOUNTER — Encounter: Payer: Self-pay | Admitting: Internal Medicine

## 2009-11-08 ENCOUNTER — Encounter: Payer: Self-pay | Admitting: Cardiology

## 2009-11-12 ENCOUNTER — Ambulatory Visit: Payer: Self-pay | Admitting: Internal Medicine

## 2009-11-12 ENCOUNTER — Telehealth: Payer: Self-pay | Admitting: Internal Medicine

## 2009-11-13 LAB — CONVERTED CEMR LAB
Bilirubin Urine: NEGATIVE
Leukocytes, UA: NEGATIVE
Nitrite: POSITIVE
Specific Gravity, Urine: 1.025 (ref 1.000–1.030)
Total Protein, Urine: NEGATIVE mg/dL
pH: 6.5 (ref 5.0–8.0)

## 2009-11-14 ENCOUNTER — Encounter: Payer: Self-pay | Admitting: Internal Medicine

## 2009-11-15 ENCOUNTER — Encounter: Payer: Self-pay | Admitting: Internal Medicine

## 2009-11-15 LAB — CONVERTED CEMR LAB: Prothrombin Time: 20.1 s

## 2009-11-18 ENCOUNTER — Telehealth: Payer: Self-pay | Admitting: Internal Medicine

## 2009-11-22 ENCOUNTER — Encounter: Payer: Self-pay | Admitting: Cardiology

## 2009-11-22 LAB — CONVERTED CEMR LAB
POC INR: 2.2
Prothrombin Time: 22 s

## 2009-12-03 ENCOUNTER — Ambulatory Visit: Payer: Self-pay | Admitting: Cardiology

## 2009-12-03 LAB — CONVERTED CEMR LAB: POC INR: 1.5

## 2009-12-11 ENCOUNTER — Ambulatory Visit: Payer: Self-pay | Admitting: Internal Medicine

## 2009-12-17 ENCOUNTER — Ambulatory Visit: Payer: Self-pay | Admitting: Cardiology

## 2009-12-17 LAB — CONVERTED CEMR LAB: POC INR: 1.9

## 2009-12-25 ENCOUNTER — Ambulatory Visit: Payer: Self-pay | Admitting: Internal Medicine

## 2009-12-31 ENCOUNTER — Ambulatory Visit: Payer: Self-pay | Admitting: Internal Medicine

## 2009-12-31 LAB — CONVERTED CEMR LAB: POC INR: 2.2

## 2010-01-14 ENCOUNTER — Ambulatory Visit: Payer: Self-pay | Admitting: Cardiovascular Disease

## 2010-02-11 ENCOUNTER — Ambulatory Visit: Payer: Self-pay | Admitting: Cardiology

## 2010-02-26 ENCOUNTER — Ambulatory Visit: Admission: RE | Admit: 2010-02-26 | Discharge: 2010-02-26 | Payer: Self-pay | Source: Home / Self Care

## 2010-03-06 ENCOUNTER — Ambulatory Visit
Admission: RE | Admit: 2010-03-06 | Discharge: 2010-03-06 | Payer: Self-pay | Source: Home / Self Care | Attending: Internal Medicine | Admitting: Internal Medicine

## 2010-03-06 ENCOUNTER — Other Ambulatory Visit: Payer: Self-pay | Admitting: Internal Medicine

## 2010-03-06 DIAGNOSIS — I872 Venous insufficiency (chronic) (peripheral): Secondary | ICD-10-CM | POA: Insufficient documentation

## 2010-03-06 LAB — LIPID PANEL
Cholesterol: 126 mg/dL (ref 0–200)
HDL: 36.1 mg/dL — ABNORMAL LOW (ref >39.00)
LDL Cholesterol: 73 mg/dL (ref 0–99)
Total CHOL/HDL Ratio: 3
Triglycerides: 84 mg/dL (ref 0.0–149.0)
VLDL: 16.8 mg/dL (ref 0.0–40.0)

## 2010-03-06 LAB — BASIC METABOLIC PANEL
BUN: 14 mg/dL (ref 6–23)
CO2: 28 mEq/L (ref 19–32)
Calcium: 9.2 mg/dL (ref 8.4–10.5)
Chloride: 106 mEq/L (ref 96–112)
Creatinine, Ser: 0.9 mg/dL (ref 0.4–1.5)
GFR: 89.7 mL/min (ref >60.00)
Glucose, Bld: 94 mg/dL (ref 70–99)
Potassium: 4.4 mEq/L (ref 3.5–5.1)
Sodium: 140 mEq/L (ref 135–145)

## 2010-03-06 LAB — CBC WITH DIFFERENTIAL/PLATELET
Basophils Absolute: 0 10*3/uL (ref 0.0–0.1)
Basophils Relative: 0.4 % (ref 0.0–3.0)
Eosinophils Absolute: 0.1 10*3/uL (ref 0.0–0.7)
Eosinophils Relative: 0.8 % (ref 0.0–5.0)
HCT: 39.9 % (ref 39.0–52.0)
Hemoglobin: 13.3 g/dL (ref 13.0–17.0)
Lymphocytes Relative: 34.4 % (ref 12.0–46.0)
Lymphs Abs: 2.3 10*3/uL (ref 0.7–4.0)
MCHC: 33.4 g/dL (ref 30.0–36.0)
MCV: 87.3 fl (ref 78.0–100.0)
Monocytes Absolute: 0.6 10*3/uL (ref 0.1–1.0)
Monocytes Relative: 8.5 % (ref 3.0–12.0)
Neutro Abs: 3.8 10*3/uL (ref 1.4–7.7)
Neutrophils Relative %: 55.9 % (ref 43.0–77.0)
Platelets: 152 10*3/uL (ref 150.0–400.0)
RBC: 4.57 Mil/uL (ref 4.22–5.81)
RDW: 14.6 % (ref 11.5–14.6)
WBC: 6.8 10*3/uL (ref 4.5–10.5)

## 2010-03-06 LAB — HEPATIC FUNCTION PANEL
ALT: 17 U/L (ref 0–53)
AST: 20 U/L (ref 0–37)
Albumin: 3.8 g/dL (ref 3.5–5.2)
Alkaline Phosphatase: 45 U/L (ref 39–117)
Bilirubin, Direct: 0.1 mg/dL (ref 0.0–0.3)
Total Bilirubin: 1 mg/dL (ref 0.3–1.2)
Total Protein: 6.7 g/dL (ref 6.0–8.3)

## 2010-03-13 ENCOUNTER — Other Ambulatory Visit: Payer: Self-pay | Admitting: Internal Medicine

## 2010-03-13 DIAGNOSIS — D3A00 Benign carcinoid tumor of unspecified site: Secondary | ICD-10-CM

## 2010-03-16 ENCOUNTER — Encounter: Payer: Self-pay | Admitting: Gastroenterology

## 2010-03-17 ENCOUNTER — Telehealth: Payer: Self-pay | Admitting: Internal Medicine

## 2010-03-25 NOTE — Progress Notes (Signed)
Summary: after hours  Phone Note Other Incoming   Caller: call a nurse Summary of Call: script never sent to pharmacy.  will call in Flagyl to 24 hr pharmacy on [address]. Initial call taken by: Neena Rhymes MD,  October 25, 2009 9:48 PM

## 2010-03-25 NOTE — Medication Information (Signed)
Summary: Coumadin Clinic  Anticoagulant Therapy  Managed by: Weston Brass, PharmD Referring MD: Illene Regulus PCP: Jacques Navy MD Supervising MD: Myrtis Ser MD, Tinnie Gens Indication 1: TIA (Transient Ischemic Attack) (ICD-435.0) Lab Used: [facility] Site: [address] PT 31.1 INR POC 3.1 INR RANGE 2 - 3  Dietary changes: yes       Details: eating most bland foods  Health status changes: yes       Details: still having diarrhea most of the time  Bleeding/hemorrhagic complications: no    Recent/future hospitalizations: no    Any changes in medication regimen? yes       Details: flagyl ends on 9/22  Recent/future dental: no  Any missed doses?: no       Is patient compliant with meds? yes       Allergies: No Known Drug Allergies  Anticoagulation Management History:      His anticoagulation is being managed by telephone today.  Positive risk factors for bleeding include an age of 75 years or older.  The bleeding index is 'intermediate risk'.  Positive CHADS2 values include History of HTN.  Negative CHADS2 values include Age > 35 years old.  The start date was 05/27/1998.  His last INR was 1.9 ratio.  Prothrombin time is 31.1.  Anticoagulation responsible Alois Mincer: Myrtis Ser MD, Tinnie Gens.  INR POC: 3.1.  Exp: 11/2010.    Anticoagulation Management Assessment/Plan:      The patient's current anticoagulation dose is Coumadin 5 mg tabs: Use as directed per anticoagulation clinic..  The target INR is 2 - 3.  The next INR is due 11/15/2009.  Anticoagulation instructions were given to spouse.  Results were reviewed/authorized by Weston Brass, PharmD.  He was notified by Weston Brass PharmD.         Prior Anticoagulation Instructions: INR 3.1  Take 2.5 mg daily except 5 mg on Wednesday, recheck on Friday.    Current Anticoagulation Instructions: INR 3.1  Skip today's dose of Coumadin then take 1/2 tablet every day.  Recheck INR in 1 week.  Orders faxed to [facility].

## 2010-03-25 NOTE — Letter (Signed)
Summary: Bernard [name]   Imported By: Marylou Mccoy 12/13/2009 15:00:24  _____________________________________________________________________  External Attachment:    Type:   Image     Comment:   External Document

## 2010-03-25 NOTE — Progress Notes (Signed)
  Faxed all Cardiac info over to [facility] w/ [facility] to fax [phone] [facility]  April 24, 2009 1:35 PM

## 2010-03-25 NOTE — Medication Information (Signed)
Summary: Coumadin Clinic  Anticoagulant Therapy  Managed by: Bethena Midget, RN, BSN Referring MD: Illene Regulus PCP: Jacques Navy MD Supervising MD: Tenny Craw MD, Gunnar Fusi Indication 1: TIA (Transient Ischemic Attack) (ICD-435.0) Lab Used: [facility] Site: [address] PT 38.8 INR POC 3.9 INR RANGE 2 - 3  Dietary changes: no    Health status changes: no    Bleeding/hemorrhagic complications: no    Recent/future hospitalizations: no    Any changes in medication regimen? no    Recent/future dental: no  Any missed doses?: no       Is patient compliant with meds? yes       Allergies: No Known Drug Allergies  Anticoagulation Management History:      His anticoagulation is being managed by telephone today.  Positive risk factors for bleeding include an age of 75 years or older.  The bleeding index is 'intermediate risk'.  Positive CHADS2 values include History of HTN.  Negative CHADS2 values include Age > 79 years old.  The start date was 05/27/1998.  His last INR was 1.9 ratio.  Prothrombin time is 38.8.  Anticoagulation responsible provider: Tenny Craw MD, Gunnar Fusi.  INR POC: 3.9.    Anticoagulation Management Assessment/Plan:      The patient's current anticoagulation dose is Coumadin 5 mg tabs: Use as directed per anticoagulation clinic..  The target INR is 2 - 3.  The next INR is due 11/04/2009.  Anticoagulation instructions were given to spouse.  Results were reviewed/authorized by Bethena Midget, RN, BSN.  He was notified by Bethena Midget, RN, BSN.         Prior Anticoagulation Instructions: INR 4.8 Skip today and tomorrow's dose. On Sat take 2.5mg s only, resume regular dose. Recheck on Monday. Orders faxed to [facility].   Current Anticoagulation Instructions: INR 3.9 Skip today then resume 2.5mg s daily except 5mg s on M, W and Sat.

## 2010-03-25 NOTE — Medication Information (Signed)
Summary: Coumadin Clinic  Anticoagulant Therapy  Managed by: Cloyde Reams, RN, BSN Referring MD: Illene Regulus PCP: Jacques Navy MD Supervising MD: Daleen Squibb MD, Maisie Fus Indication 1: TIA (Transient Ischemic Attack) (ICD-435.0) Lab Used: [facility] Site: [facility] PT 17.9 INR POC 1.8 INR RANGE 2 - 3   Health status changes: yes       Details: GI symptoms, diarrhea, abdominal cramping, seeing primary MD today.   Bleeding/hemorrhagic complications: no    Recent/future hospitalizations: yes       Details: Knee replacement surgery on Mon 10/21/09. D/C from hospital 10/24/09.  Any changes in medication regimen? yes       Details: Currently on Methocarbamol 500mg  and Oxycodone prn.    Any missed doses?: no       Is patient compliant with meds? yes       Allergies: No Known Drug Allergies  Anticoagulation Management History:      His anticoagulation is being managed by telephone today.  Positive risk factors for bleeding include an age of 48 years or older.  The bleeding index is 'intermediate risk'.  Positive CHADS2 values include History of HTN.  Negative CHADS2 values include Age > 23 years old.  The start date was 05/27/1998.  His last INR was 1.9 ratio.  Prothrombin time is 17.9.  Anticoagulation responsible provider: Daleen Squibb MD, Maisie Fus.  INR POC: 1.8.  Exp: 11/2010.    Anticoagulation Management Assessment/Plan:      The patient's current anticoagulation dose is Coumadin 5 mg tabs: Use as directed per anticoagulation clinic..  The target INR is 2 - 3.  The next INR is due 10/29/2009.  Anticoagulation instructions were given to spouse.  Results were reviewed/authorized by Cloyde Reams, RN, BSN.  He was notified by Cloyde Reams RN.         Prior Anticoagulation Instructions: INR 2.1  Continue to hold Coumadin.  Take Lovenox 150mg  once daily on Saturday and Sunday morning.  Contact our office when discharged from the hospital.    Current Anticoagulation Instructions: INR  1.8  Called spoke with pt's wife.  Last dosage of Lovenox yesterday am. Advised to have pt take 5mg  today, and then resume same dosage 2.5mg  daily except 5mg  on Mondays, Wednesdays and Saturdays.  Recheck on Tuesday. Called spoke with [name] at [facility], advised redraw PT/INR on 10/29/09.

## 2010-03-25 NOTE — Medication Information (Signed)
Summary: Coumadin Clinic  Anticoagulant Therapy  Managed by: Bernard Midget, RN, BSN Referring MD: Bernard [name] PCP: Bernard Navy MD Supervising MD: Bernard Emms MD, Bernard [name] Indication 1: TIA (Transient Ischemic Attack) (ICD-435.0) Lab Used: [facility] Site: [facility] PT 47.8 INR POC 4.8 INR RANGE 2 - 3  Dietary changes: yes       Details: Appetite Poor  Health status changes: yes       Details: Diarrhea continues since Thursday.   Bleeding/hemorrhagic complications: no    Recent/future hospitalizations: no    Any changes in medication regimen? yes       Details: [name] that was started on Friday. Using Oxycotin PRN  Recent/future dental: no  Any missed doses?: no       Is patient compliant with meds? yes      Comments: Spoke with Bernard [name] at Dr Arthur Holms office and he wants to continue [place], C-Diff was negative, but pt continue to have diarrhea.   Allergies: No Known Drug Allergies  Anticoagulation Management History:      His anticoagulation is being managed by telephone today.  Positive risk factors for bleeding include an age of 69 years or older.  The bleeding index is 'intermediate risk'.  Positive CHADS2 values include History of HTN.  Negative CHADS2 values include Age > 65 years old.  The start date was 05/27/1998.  His last INR was 1.9 ratio.  Prothrombin time is 47.8.  Anticoagulation responsible Bernard [name]: Bernard Emms MD, Bernard [name].  INR POC: 4.8.    Anticoagulation Management Assessment/Plan:      The patient's current anticoagulation dose is Coumadin 5 mg tabs: Use as directed per anticoagulation clinic..  The target INR is 2 - 3.  The next INR is due 11/04/2009.  Anticoagulation instructions were given to spouse.  Results were reviewed/authorized by Bernard Midget, RN, BSN.  He was notified by Bernard Midget, RN, BSN.         Prior Anticoagulation Instructions: INR 1.8  Called spoke with pt's wife.  Last dosage of Lovenox yesterday am. Advised to have pt take 5mg  today, and  then resume same dosage 2.5mg  daily except 5mg  on Mondays, Wednesdays and Saturdays.  Recheck on Tuesday. Called spoke with Bernard [name] at [facility], advised redraw PT/INR on 10/29/09.    Current Anticoagulation Instructions: INR 4.8 Skip today and tomorrow's dose. On Sat take 2.5mg s only, resume regular dose. Recheck on Monday. Orders faxed to [facility].

## 2010-03-25 NOTE — Medication Information (Signed)
Summary: rov/ewj  Anticoagulant Therapy  Managed by: Bethena Midget, RN, BSN Referring MD: Illene Regulus PCP: Jacques Navy MD Supervising MD: Graciela Husbands MD, Viviann Spare Indication 1: TIA (Transient Ischemic Attack) (ICD-435.0) Lab Used: [facility] Site: [facility] INR POC 2.2 INR RANGE 2 - 3  Dietary changes: no    Health status changes: no    Bleeding/hemorrhagic complications: no    Recent/future hospitalizations: no    Any changes in medication regimen? yes       Details: eye gtts   Recent/future dental: no  Any missed doses?: no       Is patient compliant with meds? yes      Comments: Cataract surgery tomorrow on Rt. eye, now using eye gtts.   Allergies: No Known Drug Allergies  Anticoagulation Management History:      The patient is taking warfarin and comes in today for a routine follow up visit.  Positive risk factors for bleeding include an age of 30 years or older.  The bleeding index is 'intermediate risk'.  Positive CHADS2 values include History of HTN.  Negative CHADS2 values include Age > 88 years old.  The start date was 05/27/1998.  His last INR was 1.9 ratio.  Anticoagulation responsible provider: Graciela Husbands MD, Viviann Spare.  INR POC: 2.2.  Cuvette Lot#: [id number].  Exp: 06/2010.    Anticoagulation Management Assessment/Plan:      The patient's current anticoagulation dose is Coumadin 5 mg tabs: Use as directed per anticoagulation clinic..  The target INR is 2 - 3.  The next INR is due 06/25/2009.  Anticoagulation instructions were given to patient.  Results were reviewed/authorized by Bethena Midget, RN, BSN.  He was notified by Bethena Midget, RN, BSN.         Prior Anticoagulation Instructions: INR 2.5  Continue on same dosage 1/2 tablets daily except 1 tablet on Wednesdays and Saturdays.  Recheck in 4 weeks.    Current Anticoagulation Instructions: INR 2.2 Continue 2.5mg s daily except 5mg s on Wednesdays and Saturdays.  Recheck in 4 weeks.

## 2010-03-25 NOTE — Medication Information (Signed)
Summary: rov/ln  Anticoagulant Therapy  Managed by: Weston Brass, PharmD Referring MD: Illene Regulus PCP: Jacques Navy MD Supervising MD: Excell Seltzer MD, Casimiro Needle Indication 1: TIA (Transient Ischemic Attack) (ICD-435.0) Lab Used: [facility] Site: [facility] INR POC 2.7 INR RANGE 2 - 3  Dietary changes: no    Health status changes: no    Bleeding/hemorrhagic complications: no    Recent/future hospitalizations: yes       Details: Knee replacement on 8/29.  Stopping Coumadin 8/24  Any changes in medication regimen? no    Recent/future dental: no  Any missed doses?: no         Allergies: No Known Drug Allergies  Anticoagulation Management History:      The patient is taking warfarin and comes in today for a routine follow up visit.  Positive risk factors for bleeding include an age of 75 years or older.  The bleeding index is 'intermediate risk'.  Positive CHADS2 values include History of HTN.  Negative CHADS2 values include Age > 75 years old.  The start date was 05/27/1998.  His last INR was 1.9 ratio.  Anticoagulation responsible provider: Excell Seltzer MD, Casimiro Needle.  INR POC: 2.7.  Cuvette Lot#: [id number].  Exp: 11/2010.    Anticoagulation Management Assessment/Plan:      The patient's current anticoagulation dose is Coumadin 5 mg tabs: Use as directed per anticoagulation clinic..  The target INR is 2 - 3.  The next INR is due 10/18/2009.  Anticoagulation instructions were given to patient.  Results were reviewed/authorized by Weston Brass, PharmD.  He was notified by Gweneth Fritter, PharmD Candidate.         Prior Anticoagulation Instructions: INR 1.7  Take 1 tab today and then change to 1 tab on Monday, Wednesday, and Saturday and 1/2 tab all other days.  Re-check INR in 3 weeks.  Current Anticoagulation Instructions: INR 2.7  Continue taking 1/2 tablet (2.5mg ) every day except take 1 tablet on Mon, Wed, and Sat.  Take your last dose of Coumadin on August 24th.  Take nothing on the  August 25th.  Start injections on 8/26.    Prescriptions: LOVENOX 150 MG/ML SOLN (ENOXAPARIN SODIUM) Inject one syringe subcutaneously into abdomen daily  #3 x 0   Entered by:   Cloyde Reams RN   Authorized by:   Hillis Range, MD   Signed by:   Cloyde Reams RN on 09/27/2009   Method used:   Electronically to        [facility] [address] (retail)       [address] [place], [place]  [address]       Ph: [phone]       Fax: [phone]   RxID:   [id number]

## 2010-03-25 NOTE — Medication Information (Signed)
Summary: rov/ewj  Anticoagulant Therapy  Managed by: Cloyde Reams, RN, BSN Referring MD: Illene Regulus PCP: Jacques Navy MD Supervising MD: Myrtis Ser MD, Tinnie Gens Indication 1: TIA (Transient Ischemic Attack) (ICD-435.0) Lab Used: [facility] Site: [facility] INR POC 2.5 INR RANGE 2 - 3  Dietary changes: no    Health status changes: no    Bleeding/hemorrhagic complications: no    Recent/future hospitalizations: no    Any changes in medication regimen? no    Recent/future dental: no  Any missed doses?: no       Is patient compliant with meds? yes       Allergies (verified): No Known Drug Allergies  Anticoagulation Management History:      The patient is taking warfarin and comes in today for a routine follow up visit.  Positive risk factors for bleeding include an age of 75 years or older.  The bleeding index is 'intermediate risk'.  Positive CHADS2 values include History of HTN.  Negative CHADS2 values include Age > 38 years old.  The start date was 05/27/1998.  His last INR was 1.9 ratio.  Anticoagulation responsible provider: Myrtis Ser MD, Tinnie Gens.  INR POC: 2.5.  Cuvette Lot#: [id number].  Exp: 06/2010.    Anticoagulation Management Assessment/Plan:      The patient's current anticoagulation dose is Coumadin 5 mg tabs: Use as directed per anticoagulation clinic..  The target INR is 2 - 3.  The next INR is due 05/28/2009.  Anticoagulation instructions were given to patient.  Results were reviewed/authorized by Cloyde Reams, RN, BSN.  He was notified by Cloyde Reams RN.         Prior Anticoagulation Instructions: INR 2.1  Continue on same dosage 1/2 tablet daily except 1 tablet on Wednesdays and Saturdays.  Recheck in 4 weeks.    Current Anticoagulation Instructions: INR 2.5  Continue on same dosage 1/2 tablets daily except 1 tablet on Wednesdays and Saturdays.  Recheck in 4 weeks.

## 2010-03-25 NOTE — Medication Information (Signed)
Summary: rov.mp  Anticoagulant Therapy  Managed by: Cloyde Reams, RN, BSN Referring MD: Illene Regulus PCP: Jacques Navy MD Supervising MD: Riley Kill MD, Maisie Fus Indication 1: TIA (Transient Ischemic Attack) (ICD-435.0) Lab Used: [facility] Site: [facility] INR POC 2.1 INR RANGE 2 - 3  Dietary changes: no    Health status changes: no    Bleeding/hemorrhagic complications: no    Recent/future hospitalizations: no    Any changes in medication regimen? yes       Details: Tylenol prn for cold x 1 week.    Recent/future dental: no  Any missed doses?: no       Is patient compliant with meds? yes       Allergies (verified): No Known Drug Allergies  Anticoagulation Management History:      The patient is taking warfarin and comes in today for a routine follow up visit.  Positive risk factors for bleeding include an age of 75 years or older.  The bleeding index is 'intermediate risk'.  Positive CHADS2 values include History of HTN.  Negative CHADS2 values include Age > 75 years old.  The start date was 05/27/1998.  His last INR was 1.9 ratio.  Anticoagulation responsible provider: Riley Kill MD, Maisie Fus.  INR POC: 2.1.  Cuvette Lot#: [id number].  Exp: 05/2010.    Anticoagulation Management Assessment/Plan:      The patient's current anticoagulation dose is Coumadin 5 mg tabs: Use as directed per anticoagulation clinic..  The target INR is 2 - 3.  The next INR is due 04/30/2009.  Anticoagulation instructions were given to patient.  Results were reviewed/authorized by Cloyde Reams, RN, BSN.  He was notified by Cloyde Reams RN.         Prior Anticoagulation Instructions: INR 1.9  Take 1 tab today, then 1 tab each Wednesday and Saturday and 0.5 tab on all other days.  Recheck in 3 weeks.   Current Anticoagulation Instructions: INR 2.1  Continue on same dosage 1/2 tablet daily except 1 tablet on Wednesdays and Saturdays.  Recheck in 4 weeks.

## 2010-03-25 NOTE — Progress Notes (Signed)
Summary: Additional labs?   Phone Note Call from Patient Call back at [facility] Phone [phone]   Summary of Call: Pt's wife called. Pt continues to have diarrhea and will be out of antibiotic tomorrow. Does patient need further meds?  Initial call taken by: Lamar Sprinkles, CMA,  November 12, 2009 11:26 AM  Follow-up for Phone Call        How many loose stools per day? any blood or mucus? If more than 2 loose stools per day will need repeat stool culture for C. Diff, leukoferrin. Follow-up by: Jacques Navy MD,  November 12, 2009 12:51 PM  Additional Follow-up for Phone Call Additional follow up Details #1::        Spoke w/pt and wife. Pt has had approx 4 loose stools daily with visible mucus. NO blood or fever. Bp per home health nurse today was 108/68.   Wife is also concerned about frequent urination and would like urine tested also. OK?  Additional Follow-up by: Lamar Sprinkles, CMA,  November 12, 2009 2:31 PM    Additional Follow-up for Phone Call Additional follow up Details #2::    OK for U/A 788.1 Follow-up by: Jacques Navy MD,  November 12, 2009 6:03 PM  Additional Follow-up for Phone Call Additional follow up Details #3:: Details for Additional Follow-up Action Taken: added to lab - pt dropped off specimen, lab informed. .....................Marland KitchenLamar Sprinkles, CMA  November 13, 2009 1:43 PM

## 2010-03-25 NOTE — Progress Notes (Signed)
Summary: PT AT [facility]  Phone Note Call from Patient   Summary of Call: FYI - Pt is at [facility] rm 1602, had knee replacement surgery.  Initial call taken by: Lamar Sprinkles, CMA,  October 21, 2009 12:24 PM

## 2010-03-25 NOTE — Assessment & Plan Note (Signed)
Summary: rov   Visit Type:  Follow-up Referring Provider:  Waynard Edwards, MD Primary Provider:  Jacques Navy MD   History of Present Illness: Bernard [name] is a pleasant 77 yo WM with a history of cerebrovascular disease, CAD, and recently diagnosed lung CA s/p resection who presents for cardiology follow-up.  He reports having a CVA 1994 and  TIA 2000.  He has been on coumadin since March, 2000.   He recently had lung surgery and on POD2  had afib w/ RVR.  He was placed on amiodarone and has done well, without symptoms of atrial fibrillation during his hospitalization.  He subsequently discontinued amiodarone without further symptoms of arrhythmia. Recently he reports pain in his R knee.  His is presently not very active due to knee pain.  He denies palpitations, chest pain, SOB, orthopnea, PND, edema, presyncope, or syncope.  He is otherwise without complaint today.   Pt reports having a heart attack in 1976, no prior cath.  Last stress test 10 years ago.  Current Medications (verified): 1)  Coumadin 5 Mg Tabs (Warfarin Sodium) .... Use As Directed Per Anticoagulation Clinic. 2)  Terazosin Hcl 5 Mg Caps (Terazosin Hcl) .... Take 1 Tablet  Every Other Day 3)  Zocor 20 Mg  Tabs (Simvastatin) .... Take 1 By Mouth Qd 4)  Proscar 5 Mg  Tabs (Finasteride) .... Take 1 By Mouth Qd 5)  Valium 5 Mg  Tabs (Diazepam) .... Take 1 Tablet By Mouth Two Times A Day 6)  Nitrostat 0.4 Mg  Subl (Nitroglycerin) .... Use Prn 7)  Prilosec 20 Mg  Cpdr (Omeprazole) .... Take One Tablet By Mouth Every Other Day 8)  Midrin 325-65-100 Mg  Caps (Apap-Isometheptene-Dichloral) .... As Needed 9)  Celebrex 200 Mg Caps (Celecoxib) .[name].. 1 By Mouth As Needed For Arthritic Pain: Required Due To Patient On Coumadin & At Higher Risk 10)  Caltrate 600 1500 Mg Tabs (Calcium Carbonate) .... 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 EGD (05/14/2004) Bunionectomy '89 - left foot Inguinal herniorrhaphy-left '71 Arthroscopic surgery Right knee - '10 VAT-RML lobectomy  Social History: Reviewed history from 10/19/2008 and no changes required. 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 Denies Tob, ETOH, Drugs.  Review of Systems       All systems are reviewed and negative except as listed in the HPI.   Vital Signs:  Patient profile:   77 year old male Height:      68 inches Weight:      207 pounds BMI:     31.59 Pulse rate:   54 / minute BP sitting:   138 / 72  (left arm)  Vitals Entered By: Laurance Flatten CMA (September 05, 2009 12:03 PM)  Physical Exam  General:  Well developed, well nourished, in no acute distress. Head:  normocephalic and atraumatic Eyes:  PERRLA/EOM intact; conjunctiva and lids normal. Mouth:  Teeth, gums and palate normal. Oral mucosa normal. Neck:  Neck supple, no JVD. No masses, thyromegaly or  abnormal cervical nodes. Lungs:  decreased R sided breathsounds, otherwise clear Heart:  Non-displaced PMI, chest non-tender; regular rate and rhythm, S1, S2 without murmurs, rubs or gallops. Carotid upstroke normal, no bruit. Normal abdominal aortic size, no bruits. Femorals normal pulses, no bruits. Pedals normal pulses. No edema, no varicosities. Abdomen:  Bowel sounds positive; abdomen soft and non-tender without  masses, organomegaly, or hernias noted. No hepatosplenomegaly. Msk:  Back normal, normal gait. Muscle strength and tone normal. Pulses:  pulses normal in all 4 extremities Extremities:  No clubbing or cyanosis. Neurologic:  Alert and oriented x 3.  CNII-XII intact, strength/sensation are intact   EKG  Procedure date:  09/05/2009  Findings:      sinus bradycardia 54 bpm with PACs, LAD, otherwise normal ekg  Impression & Recommendations:  Problem # 1:  CORONARY ARTERY DISEASE (ICD-414.00) He is s/p MI 1970s but has not had prior PTCA or revascularization.  He presently denies symptoms of ischemia but is minimally active due to his knee pain.  I would therefore recommend that we proceed with further risk stratification with lexiscan myoview.  If his study is low risk, then I would recommend that he proceed with surgery.  IF moderate or high risk, then we will make further recommendations at that time.  Problem # 2:  ATRIAL FIBRILLATION, PAROXYSMAL, HX OF (ICD-V12.50) continue coumadin he will hold coumadin for 5 days prior to knee surgery and resume coumadin immediately after surgery  Problem # 3:  HYPERTENSION (ICD-401.9) stable no changes  Problem # 4:  HYPERLIPIDEMIA (ICD-272.4) stable  Problem # 5:  CEREBROVASCULAR DISEASE (ICD-437.9) continue coumadin minimize time off of coumadin perioperatively  Other Orders: Nuclear Stress Test (Nuc Stress Test)  Patient Instructions: 1)  Your physician recommends that you schedule a follow-up appointment in: 12 months with Dr Johney Frame 2)  Your physician has requested that you have an lexiscan myoview.  For further information please visit [email or web address]  Please follow instruction sheet, as given.

## 2010-03-25 NOTE — Medication Information (Signed)
Summary: Coumadin Clinic  Anticoagulant Therapy  Managed by: Weston Brass, PharmD Referring MD: Illene Regulus PCP: Jacques Navy MD Supervising MD: Tenny Craw MD, Gunnar Fusi Indication 1: TIA (Transient Ischemic Attack) (ICD-435.0) Lab Used: [facility] Site: [address] PT 20.1 INR POC 2.0 INR RANGE 2 - 3  Dietary changes: yes       Details: still on bland, liquid diet   Health status changes: yes       Details: some diarrhea but improved   Bleeding/hemorrhagic complications: no    Recent/future hospitalizations: no    Any changes in medication regimen? yes       Details: still on flagyl  Recent/future dental: no  Any missed doses?: no       Is patient compliant with meds? yes       Allergies: No Known Drug Allergies  Anticoagulation Management History:      His anticoagulation is being managed by telephone today.  Positive risk factors for bleeding include an age of 75 years or older.  The bleeding index is 'intermediate risk'.  Positive CHADS2 values include History of HTN.  Negative CHADS2 values include Age > 18 years old.  The start date was 05/27/1998.  His last INR was 1.9 ratio.  Prothrombin time is 20.1.  Anticoagulation responsible provider: Tenny Craw MD, Gunnar Fusi.  INR POC: 2.0.  Exp: 11/2010.    Anticoagulation Management Assessment/Plan:      The patient's current anticoagulation dose is Coumadin 5 mg tabs: Use as directed per anticoagulation clinic..  The target INR is 2 - 3.  The next INR is due 11/22/2009.  Anticoagulation instructions were given to spouse.  Results were reviewed/authorized by Weston Brass, PharmD.  He was notified by Weston Brass PharmD.         Prior Anticoagulation Instructions: INR 3.1  Skip today's dose of Coumadin then take 1/2 tablet every day.  Recheck INR in 1 week.  Orders faxed to [facility].   Current Anticoagulation Instructions: INR 2.0  Spoke with pt's wife.  Increase dose to 1/2 tablet every day except 1 tablet on Monday and Saturday.   Recheck INR in 1 week.

## 2010-03-25 NOTE — Assessment & Plan Note (Signed)
Summary: medical clearance for r knee replacement surgery/#/cd   Vital Signs:  Patient profile:   77 year old male Height:      68 inches Weight:      207 pounds BMI:     31.59 O2 Sat:      95 % on Room air Temp:     96.9 degrees F oral Pulse rate:   62 / minute BP sitting:   122 / 78  (left arm) Cuff size:   large  Vitals Entered By: Bill Salinas CMA (September 09, 2009 10:28 AM)  O2 Flow:  Room air CC: office visit for clearance on Right knee replacement sch Oct 21, 2009/ ab   Primary Care Provider:  Jacques Navy MD  CC:  office visit for clearance on Right knee replacement sch Aug 29 and 2011/ ab.  History of Present Illness: Patient presents for pre-op clearance. He is for right TKR in late August. He has recently seen Dr. Tillie Rung and is scheduled for stress test. He is followed by Dr. Arbutus Ped for lung caner after surgical treatment by Dr. [name]. He is for follow-up CT chest in early August. His general medical condition has been good and he has no complaints today except for his painful right knee.   Current Medications (verified): 1)  Coumadin 5 Mg Tabs (Warfarin Sodium) .... Use As Directed Per Anticoagulation Clinic. 2)  Terazosin Hcl 5 Mg Caps (Terazosin Hcl) .... Take 1 Tablet  Every Other Day 3)  Zocor 20 Mg  Tabs (Simvastatin) .... Take 1 By Mouth Qd 4)  Proscar 5 Mg  Tabs (Finasteride) .... Take 1 By Mouth Qd 5)  Valium 5 Mg  Tabs (Diazepam) .... Take 1 Tablet By Mouth Two Times A Day 6)  Nitrostat 0.4 Mg  Subl (Nitroglycerin) .... Use Prn 7)  Prilosec 20 Mg  Cpdr (Omeprazole) .... Take One Tablet By Mouth Every Other Day 8)  Midrin 325-65-100 Mg  Caps (Apap-Isometheptene-Dichloral) .... As Needed 9)  Celebrex 200 Mg Caps (Celecoxib) .[name].. 1 By Mouth As Needed For Arthritic Pain: Required Due To Patient On Coumadin & At Higher Risk 10)  Caltrate 600 1500 Mg Tabs (Calcium Carbonate) .... 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.4) Dx 07/27/08, s/p resection 7/10 ATRIAL FIBRILLATION, PAROXYSMAL (ICD-427.31)- postoperative after lung surgery OSTEOARTHRITIS, KNEE (ICD-715.96) LABRYNTHITIS (ICD-386.30) GERD (ICD-530.81) CEREBROVASCULAR DISEASE s/p prior stroke HYPERTENSION (ICD-401.9) HYPERLIPIDEMIA (ICD-272.4) DEPRESSION (ICD-311) CORONARY ARTERY DISEASE s/p MI 1970s  Past Surgical History: Last updated: 10/09/2008 EGD (05/14/2004) Bunionectomy '89 - left foot Inguinal herniorrhaphy-left '71 Arthroscopic surgery Right knee - '10 VAT-RML lobectomy  Family History: Last updated: 05/12/07 father - deceased @ 20: CVA mother - deceased @93  1/2: DM, HTN Neg- colon or prostate cancer, CAD 2nd kin + for DM    Social History: Last updated: 10/19/2008 Married '70 1 son - '75 retired - Surveyor, 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 Denies Tob, ETOH, Drugs.  Risk Factors: Caffeine Use: 0 (May 12, 2007) Exercise: no (2007/05/12)  Risk Factors: Smoking Status: never (03/21/2007)  Review of Systems       The patient complains of weight gain and difficulty walking.  The patient denies anorexia, fever, weight loss, hoarseness, chest pain, syncope, dyspnea on exertion, peripheral edema, abdominal pain, severe indigestion/heartburn, muscle weakness, depression, abnormal bleeding, enlarged lymph nodes,  and testicular masses.    Physical Exam  General:  mildly overweight white male in no distress Head:  Normocephalic and atraumatic without obvious abnormalities. No apparent alopecia or balding. Eyes:  pupils equal and pupils round.  C&S clear Ears:  External ear exam shows no significant lesions or deformities.  Otoscopic examination reveals clear canals, tympanic membranes are intact bilaterally  without bulging, retraction, inflammation or discharge. Hearing is grossly normal bilaterally. Mouth:  Oral mucosa and oropharynx without lesions or exudates.  Teeth in good repair. Neck:  No deformities, masses, or tenderness noted.no carotid bruits.   Chest Wall:  scar right chest at axillary line and chest tube scar as well Lungs:  Normal respiratory effort, chest expands symmetrically. Lungs are clear to auscultation, no crackles or wheezes. Heart:  Normal rate and regular rhythm. S1 and S2 normal without gallop, murmur, click, rub or other extra sounds. Abdomen:  soft, non-tender, normal bowel sounds, no distention, no guarding, no rigidity, and no hepatomegaly.   Msk:  normal ROM, no joint tenderness, no joint swelling, no joint warmth, and no joint deformities.   Pulses:  2+ radial Extremities:  No clubbing, cyanosis, edema, or deformity noted with normal full range of motion of all joints.   Neurologic:  alert & oriented X3, cranial nerves II-XII intact, strength normal in all extremities, sensation intact to light touch, gait normal, and DTRs symmetrical and normal.   Skin:  turgor normal, color normal, no rashes, and no ulcerations.   Cervical Nodes:  no anterior cervical adenopathy and no posterior cervical adenopathy.   Psych:  Oriented X3, memory intact for recent and remote, normally interactive, and good eye contact.     Impression & Recommendations:  Problem # 1:  PREOPERATIVE EXAMINATION (ICD-V72.84) for pre-op clearance: he will have cardiac stress test July 19th. Will schedule him for full PFT's. His physical exam is normal and recent lab work is fine.  Plan - if stress test and PFT's are OK he is cleared medically for surgery.       fax attn wendy caton  Complete Medication List: 1)  Coumadin 5 Mg Tabs (Warfarin sodium) .... Use as directed per anticoagulation clinic. 2)  Terazosin Hcl 5 Mg Caps (Terazosin hcl) .... Take 1 tablet  every other day 3)  Zocor 20 Mg  Tabs (Simvastatin) .... Take 1 by mouth qd 4)  Proscar 5 Mg Tabs (Finasteride) .... Take 1 by mouth qd 5)  Valium 5 Mg Tabs (Diazepam) .... Take 1 tablet by mouth two times a day 6)  Nitrostat 0.4 Mg Subl (Nitroglycerin) .... Use prn 7)  Prilosec 20 Mg Cpdr (Omeprazole) .... Take one tablet by mouth every other day 8)  Midrin 325-65-100 Mg Caps (Apap-isometheptene-dichloral) .... As needed 9)  Celebrex 200 Mg Caps (Celecoxib) .[name].. 1 by mouth as needed for arthritic pain: required due to patient on coumadin & at higher risk 10)  Caltrate 600 1500 Mg Tabs (Calcium carbonate) .... Once daily  Other Orders: Pulmonary Referral (Pulmonary)   Preventive Care Screening  Last Pneumovax:    Date:  07/24/2008    Results:  given

## 2010-03-25 NOTE — Letter (Signed)
[facility]   Imported By: Lennie Odor 04/24/2009 14:27:38  _____________________________________________________________________  External Attachment:    Type:   Image     Comment:   External Document

## 2010-03-25 NOTE — Medication Information (Signed)
Summary: rov/eac  Anticoagulant Therapy  Managed by: Bethena Midget, RN, BSN Referring MD: Illene Regulus PCP: Jacques Navy MD Supervising MD: Riley Kill MD, Maisie Fus Indication 1: TIA (Transient Ischemic Attack) (ICD-435.0) Lab Used: [facility] Site: [facility] INR POC 2.1 INR RANGE 2 - 3  Dietary changes: no    Health status changes: no    Bleeding/hemorrhagic complications: no    Recent/future hospitalizations: no    Any changes in medication regimen? no    Recent/future dental: no  Any missed doses?: no       Is patient compliant with meds? yes       Allergies: No Known Drug Allergies  Anticoagulation Management History:      The patient is taking warfarin and comes in today for a routine follow up visit.  Positive risk factors for bleeding include an age of 75 years or older.  The bleeding index is 'intermediate risk'.  Positive CHADS2 values include History of HTN.  Negative CHADS2 values include Age > 75 years old.  The start date was 05/27/1998.  His last INR was 1.9 ratio.  Anticoagulation responsible provider: Riley Kill MD, Maisie Fus.  INR POC: 2.1.  Cuvette Lot#: [id number].  Exp: 09/2010.    Anticoagulation Management Assessment/Plan:      The patient's current anticoagulation dose is Coumadin 5 mg tabs: Use as directed per anticoagulation clinic..  The target INR is 2 - 3.  The next INR is due 08/21/2009.  Anticoagulation instructions were given to patient.  Results were reviewed/authorized by Bethena Midget, RN, BSN.  He was notified by Bethena Midget, RN, BSN.         Prior Anticoagulation Instructions: INR 2.1  Coumadin 1 tab = 5mg  on Wed and Sat 1/2 tab all other days  Current Anticoagulation Instructions: INR 2.1 Continue 2.5mg  daily except 5mg s on Wednesdays and Saturdays. Recheck in 4 weeks.

## 2010-03-25 NOTE — Medication Information (Signed)
Summary: Coumadin Clinic  Anticoagulant Therapy  Managed by: Weston Brass, PharmD Referring MD: Illene Regulus PCP: Jacques Navy MD Supervising MD: Juanda Chance MD, Bruce Indication 1: TIA (Transient Ischemic Attack) (ICD-435.0) Lab Used: [facility] Site: [address] PT 22.0 INR POC 2.2 INR RANGE 2 - 3  Dietary changes: no    Health status changes: no    Bleeding/hemorrhagic complications: no    Recent/future hospitalizations: no    Any changes in medication regimen? yes       Details: finishes abx today  Recent/future dental: no  Any missed doses?: no       Is patient compliant with meds? yes       Allergies: No Known Drug Allergies   Anticoagulation Management History:      His anticoagulation is being managed by telephone today.  Positive risk factors for bleeding include an age of 55 years or older.  The bleeding index is 'intermediate risk'.  Positive CHADS2 values include History of HTN.  Negative CHADS2 values include Age > 37 years old.  The start date was 05/27/1998.  His last INR was 1.9 ratio.  Prothrombin time is 22.0.  Anticoagulation responsible provider: Juanda Chance MD, Smitty Cords.  INR POC: 2.2.  Exp: 11/2010.    Anticoagulation Management Assessment/Plan:      The patient's current anticoagulation dose is Coumadin 5 mg tabs: Use as directed per anticoagulation clinic..  The target INR is 2 - 3.  The next INR is due 12/03/2009.  Anticoagulation instructions were given to spouse.  Results were reviewed/authorized by Weston Brass, PharmD.  He was notified by Weston Brass PharmD.         Prior Anticoagulation Instructions: INR 2.0  Spoke with pt's wife.  Increase dose to 1/2 tablet every day except 1 tablet on Monday and Saturday.  Recheck INR in 1 week.   Current Anticoagulation Instructions: INR 2.2  Spoke with pt.  Continue same dose of 1/2 tablet every day except 1 tablet on Monday and Saturday.  Recheck INR in 10 days.  Appt made for pt to return to clinic

## 2010-03-25 NOTE — Medication Information (Signed)
Summary: rov/sl  Anticoagulant Therapy  Managed by: Bethena Midget, RN, BSN Referring MD: Illene Regulus PCP: Jacques Navy MD Supervising MD: Shirlee Latch MD, Dalton Indication 1: TIA (Transient Ischemic Attack) (ICD-435.0) Lab Used: [facility] Site: [address] INR POC 1.9 INR RANGE 2 - 3  Dietary changes: no    Health status changes: no    Bleeding/hemorrhagic complications: no    Recent/future hospitalizations: no    Any changes in medication regimen? no    Recent/future dental: no  Any missed doses?: no       Is patient compliant with meds? yes       Allergies: No Known Drug Allergies  Anticoagulation Management History:      The patient is taking warfarin and comes in today for a routine follow up visit.  Positive risk factors for bleeding include an age of 75 years or older.  The bleeding index is 'intermediate risk'.  Positive CHADS2 values include History of HTN.  Negative CHADS2 values include Age > 75 years old.  The start date was 05/27/1998.  His last INR was 1.9 ratio.  Anticoagulation responsible provider: Shirlee Latch MD, Dalton.  INR POC: 1.9.  Cuvette Lot#: [id number].  Exp: 12/2010.    Anticoagulation Management Assessment/Plan:      The patient's current anticoagulation dose is Coumadin 5 mg tabs: Use as directed per anticoagulation clinic..  The target INR is 2 - 3.  The next INR is due 12/31/2009.  Anticoagulation instructions were given to spouse/patient.  Results were reviewed/authorized by Bethena Midget, RN, BSN.  He was notified by Bethena Midget, RN, BSN.         Prior Anticoagulation Instructions: INR 1.5  Today, Tuesday, October 11th take Coumadin 1 tab (5 mg). Then, take Coumadin 0.5 tab (2.5 mg) on Sun, Tues, Thur, Fri and Coumadin 1 tab (5 mg) on Mon, Wed, Sat. Return to clinic in 2 weeks.   Current Anticoagulation Instructions: INR 1.9 Today 5mg  then resume 2.5mg s everyday except 5mg s on Mondays, Wednesdays and Fridays. Recheck  in 2 weeks.

## 2010-03-25 NOTE — Assessment & Plan Note (Signed)
Summary: INFECTION IN GI TRACK PER HOME HEALTH NURSE/NWS   Vital Signs:  Patient profile:   77 year old male Height:      68 inches O2 Sat:      94 % on Room air Temp:     98.5 degrees F oral Pulse rate:   82 / minute BP sitting:   104 / 64  (left arm) Cuff size:   large  Vitals Entered By: Bill Salinas CMA (October 25, 2009 5:03 PM)  O2 Flow:  Room air CC: ov for evaluation of abd pain and destention/ ab   Primary Care Provider:  Jacques Navy MD  CC:  ov for evaluation of abd pain and destention/ ab.  History of Present Illness: patient seen acutely for abdominal distention and diarrhea.  Bernard [name] is s/p right TKR and was d/c from hospital yesterday. He since being home has had severl loose watery bowel movements. He had been on a short course of antibiotics. He was told to send in a stool specimen to check for C. Diff. HH RN saw him at home today: he had abdominal distention that swhe thought warranted urgent evaluation in the office. He has not had abdominal pain. He has been able to eat and drink without nausea or vomiting. He continues to have loose stools without blood or mucus.  He has otherwise been stable.   Current Medications (verified): 1)  Coumadin 5 Mg Tabs (Warfarin Sodium) .... Use As Directed Per Anticoagulation Clinic. 2)  Terazosin Hcl 5 Mg Caps (Terazosin Hcl) .... Take 1 Tablet  Every Other Day 3)  Zocor 20 Mg  Tabs (Simvastatin) .... Take 1 By Mouth Qd 4)  Proscar 5 Mg  Tabs (Finasteride) .... Take 1 By Mouth Qd 5)  Valium 5 Mg  Tabs (Diazepam) .... Take 1 Tablet By Mouth Two Times A Day 6)  Nitrostat 0.4 Mg  Subl (Nitroglycerin) .... Use Prn 7)  Prilosec 20 Mg  Cpdr (Omeprazole) .... Take One Tablet By Mouth Every Other Day 8)  Midrin 325-65-100 Mg  Caps (Apap-Isometheptene-Dichloral) .... As Needed 9)  Celebrex 200 Mg Caps (Celecoxib) .[name].. 1 By Mouth As Needed For Arthritic Pain: Required Due To Patient On Coumadin & At Higher Risk 10)  Caltrate  600 1500 Mg Tabs (Calcium Carbonate) .... Once Daily 11)  Lovenox 150 Mg/ml Soln (Enoxaparin Sodium) .... Inject One Syringe Subcutaneously Into Abdomen Daily  Allergies (verified): No Known Drug Allergies  Past History:  Past Surgical History: EGD (05/14/2004) Bunionectomy '89 - left foot Inguinal herniorrhaphy-left '71 Arthroscopic surgery Right knee - '10 VAT-RML lobectomy Right TKR August '11  Review of Systems       The patient complains of abdominal pain.  The patient denies anorexia, fever, weight loss, decreased hearing, chest pain, dyspnea on exertion, peripheral edema, prolonged cough, melena, hematochezia, muscle weakness, depression, and enlarged lymph nodes.    Physical Exam  General:  overweight white male in a w/c in no distress Head:  normocephalic and atraumatic.   Eyes:  C&S clear Lungs:  normal respiratory effort and normal breath sounds.   Heart:  normal rate and regular rhythm.   Abdomen:  mildly distended. Hypoactive bowel sounds. No guarding or rebound, no tenderness to palpation. No mass. Msk:  in a knee brace right knee Neurologic:  alert & oriented X3 and cranial nerves II-XII intact.     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 No evidene of obstruction and he is able to take POs.  Plan - supportive care including attention to hydration           minimize use of narcotics.  Problem # 2:  DIARRHEA (ICD-787.91) possible C. Diff. Stool specimen brought in today and in lab. He does not have fever or bloody stools.  Plan - preemptive flagyl 500mg  three times a day pending return of stool culture.  Complete Medication List: 1)  Coumadin 5 Mg Tabs (Warfarin sodium) .... Use as directed per anticoagulation clinic. 2)  Terazosin Hcl 5 Mg Caps (Terazosin hcl) .... Take 1 tablet  every other day 3)  Zocor 20 Mg  Tabs (Simvastatin) .... Take 1 by mouth qd 4)  Proscar 5 Mg Tabs (Finasteride) .... Take 1 by mouth qd 5)  Valium 5 Mg Tabs (Diazepam) .... Take 1 tablet by mouth two times a day 6)  Nitrostat 0.4 Mg Subl (Nitroglycerin) .... Use prn 7)  Prilosec 20 Mg Cpdr (Omeprazole) .... Take one tablet by mouth every other day 8)  Midrin 325-65-100 Mg Caps (Apap-isometheptene-dichloral) .... As needed 9)  Celebrex 200 Mg Caps (Celecoxib) .[name].. 1 by mouth as needed for arthritic pain: required due to patient on coumadin & at higher risk 10)  Caltrate 600 1500 Mg Tabs (Calcium carbonate) .... Once daily 11)  Lovenox 150 Mg/ml Soln (Enoxaparin sodium) .... Inject one syringe subcutaneously into abdomen daily 12)  Oxycodone-acetaminophen 5-325 Mg Tabs (Oxycodone-acetaminophen) .[name].. 1 to 2 every 4 hours as needed pain 13)  Methocarbamol 500 Mg Tabs (Methocarbamol) .[name].. 1 by mouth once daily every 6 hours as needed 14)  Metronidazole 500 Mg Tabs (Metronidazole) .[name].. 1 by mouth three times a day for possible c. diff Prescriptions: METRONIDAZOLE 500 MG TABS (METRONIDAZOLE) 1 by mouth three times a day for possible C. Diff  #30 x 1   Entered and Authorized by:   Jacques Navy MD   Signed by:   Jacques Navy MD on 10/25/2009   Method used:   Electronically to        [facility] [phone]* (retail)       [address]       [place], [place]  [address]       Ph: [phone]       Fax: [phone]   RxID:   [id number]

## 2010-03-25 NOTE — Progress Notes (Signed)
Summary: Flagyl start  Phone Note Outgoing Call   Call placed by: Cloyde Reams RN,  October 29, 2009 12:35 PM Call placed to: Patient Summary of Call: Called spoke with Bernard [name], Bernard [name] started on Flagyl 500mg  three times a day x 10 days on Friday 10/25/09 for possible C-Diff. Advised Bernard [name] Flagyl can  increase INR.  Bernard [name] has home health RN coming to home at present.  Genevieve Norlander RN coming to home to check Bernard [name]/INR today per previous redraw orders.  Will await todays results from [facility] and call back with further dosage instructions.  Initial call taken by: Cloyde Reams RN,  October 29, 2009 12:38 PM

## 2010-03-25 NOTE — Medication Information (Signed)
Summary: rov/sp  Anticoagulant Therapy  Managed by: Weston Brass, PharmD Referring MD: Illene Regulus PCP: Jacques Navy MD Supervising MD: Antoine Poche MD, Fayrene Fearing Indication 1: TIA (Transient Ischemic Attack) (ICD-435.0) Lab Used: [facility] Site: [facility] INR POC 1.7 INR RANGE 2 - 3  Dietary changes: no    Health status changes: no    Bleeding/hemorrhagic complications: no    Recent/future hospitalizations: yes       Details: knee replacement on 8/29  Any changes in medication regimen? no    Recent/future dental: no  Any missed doses?: no       Is patient compliant with meds? yes       Allergies: No Known Drug Allergies  Anticoagulation Management History:      The patient is taking warfarin and comes in today for a routine follow up visit.  Positive risk factors for bleeding include an age of 5 years or older.  The bleeding index is 'intermediate risk'.  Positive CHADS2 values include History of HTN.  Negative CHADS2 values include Age > 54 years old.  The start date was 05/27/1998.  His last INR was 1.9 ratio.  Anticoagulation responsible provider: Antoine Poche MD, Fayrene Fearing.  INR POC: 1.7.  Cuvette Lot#: [id number].  Exp: 10/2010.    Anticoagulation Management Assessment/Plan:      The patient's current anticoagulation dose is Coumadin 5 mg tabs: Use as directed per anticoagulation clinic..  The target INR is 2 - 3.  The next INR is due 09/30/2009.  Anticoagulation instructions were given to patient.  Results were reviewed/authorized by Weston Brass, PharmD.  He was notified by Dillard Cannon.         Prior Anticoagulation Instructions: INR 1.7  Take 1 1/2 tablets today then resume same dose of 1/2 tablet every day except 1 tablet on Wednesday and Saturday.   Current Anticoagulation Instructions: INR 1.7  Take 1 tab today and then change to 1 tab on Monday, Wednesday, and Saturday and 1/2 tab all other days.  Re-check INR in 3 weeks.

## 2010-03-25 NOTE — Progress Notes (Signed)
Summary: labs  Phone Note Outgoing Call   Reason for Call: Discuss lab or test results Summary of Call: please call patient: stool negative for c. diff or sign of infection. Urine culture negative.  Thanks Initial call taken by: Jacques Navy MD,  November 18, 2009 9:08 AM  Follow-up for Phone Call        Notified pt wife with results. Wife want to know if he should continued taking antibiotic that md rx. She states this is his second coarse. Pls advise Follow-up by: Orlan Leavens RMA,  November 18, 2009 12:00 PM  Additional Follow-up for Phone Call Additional follow up Details #1::        finish this course of flagyl Additional Follow-up by: Jacques Navy MD,  November 18, 2009 12:15 PM    Additional Follow-up for Phone Call Additional follow up Details #2::    Notified pt wife with md response Follow-up by: Orlan Leavens RMA,  November 18, 2009 1:25 PM

## 2010-03-25 NOTE — Medication Information (Signed)
Summary: rov/sp  Anticoagulant Therapy  Managed by: Reina Fuse, PharmD Referring MD: Illene Regulus PCP: Jacques Navy MD Supervising MD: Juanda Chance MD, Renu Asby Indication 1: TIA (Transient Ischemic Attack) (ICD-435.0) Lab Used: [facility] Site: [address] INR POC 1.5 INR RANGE 2 - 3  Dietary changes: yes       Details: More PO intake now.   Health status changes: no    Bleeding/hemorrhagic complications: no    Recent/future hospitalizations: no    Any changes in medication regimen? yes       Details: Finished metronidazole two weeks ago.  Recent/future dental: no  Any missed doses?: no       Is patient compliant with meds? yes      Comments: Pt had been not eating due to C. diff and finished course of metronidazole since last visit.   Current Medications (verified): 1)  Coumadin 5 Mg Tabs (Warfarin Sodium) .... Use As Directed Per Anticoagulation Clinic. 2)  Terazosin Hcl 5 Mg Caps (Terazosin Hcl) .... Take 1 Tablet  Every Other Day 3)  Zocor 20 Mg  Tabs (Simvastatin) .... Take 1 By Mouth Qd 4)  Proscar 5 Mg  Tabs (Finasteride) .... Take 1 By Mouth Qd 5)  Valium 5 Mg  Tabs (Diazepam) .... Take 1 Tablet By Mouth Two Times A Day 6)  Nitrostat 0.4 Mg  Subl (Nitroglycerin) .... Use Prn 7)  Prilosec 20 Mg  Cpdr (Omeprazole) .... Take One Tablet By Mouth Every Other Day 8)  Midrin 325-65-100 Mg  Caps (Apap-Isometheptene-Dichloral) .... As Needed 9)  Celebrex 200 Mg Caps (Celecoxib) .[name].. 1 By Mouth As Needed For Arthritic Pain: Required Due To Patient On Coumadin & At Higher Risk 10)  Caltrate 600 1500 Mg Tabs (Calcium Carbonate) .... Once Daily 11)  Lovenox 150 Mg/ml Soln (Enoxaparin Sodium) .... Inject One Syringe Subcutaneously Into Abdomen Daily 12)  Oxycodone-Acetaminophen 5-325 Mg Tabs (Oxycodone-Acetaminophen) .[name].. 1 To 2 Every 4 Hours As Needed Pain 13)  Methocarbamol 500 Mg Tabs (Methocarbamol) .[name].. 1 By Mouth Once Daily Every 6 Hours As Needed 14)  Flagyl 500 Mg  Tabs (Metronidazole) .[name].. 1 Tab Three Times A Day  Allergies (verified): No Known Drug Allergies  Anticoagulation Management History:      The patient is taking warfarin and comes in today for a routine follow up visit.  Positive risk factors for bleeding include an age of 75 years or older.  The bleeding index is 'intermediate risk'.  Positive CHADS2 values include History of HTN.  Negative CHADS2 values include Age > 75 years old.  The start date was 05/27/1998.  His last INR was 1.9 ratio.  Anticoagulation responsible provider: Juanda Chance MD, Smitty Cords.  INR POC: 1.5.  Cuvette Lot#: [id number].  Exp: 11/2010.    Anticoagulation Management Assessment/Plan:      The patient's current anticoagulation dose is Coumadin 5 mg tabs: Use as directed per anticoagulation clinic..  The target INR is 2 - 3.  The next INR is due 12/17/2009.  Anticoagulation instructions were given to spouse.  Results were reviewed/authorized by Reina Fuse, PharmD.  He was notified by Reina Fuse PharmD.         Prior Anticoagulation Instructions: INR 2.2  Spoke with pt.  Continue same dose of 1/2 tablet every day except 1 tablet on Monday and Saturday.  Recheck INR in 10 days.  Appt made for pt to return to clinic  Current Anticoagulation Instructions: INR 1.5  Today, Tuesday, October 11th take Coumadin 1 tab (  5 mg). Then, take Coumadin 0.5 tab (2.5 mg) on Sun, Tues, Thur, Fri and Coumadin 1 tab (5 mg) on Mon, Wed, Sat. Return to clinic in 2 weeks.

## 2010-03-25 NOTE — Progress Notes (Signed)
Summary: Nuclear Pre-Procedure  Phone Note Outgoing Call Call back at [facility] Phone [phone]   Call placed by: Stanton Kidney, EMT-P,  September 09, 2009 2:15 PM Call placed to: Patient Action Taken: Phone Call Completed Summary of Call: Reviewed information on [employer] Information Sheet (see scanned document for further details).  Spoke with Patient.    Nuclear Med Background Indications for Stress Test: Evaluation for Ischemia, Surgical Clearance  Indications Comments: Pending (R) TKR 10/21/09  History: Myocardial Infarction, Myocardial Perfusion Study  History Comments: '76 MI '03 MPS: EF=64%     Nuclear Pre-Procedure Cardiac Risk Factors: CVA, Hypertension, Lipids, TIA Height (in): 68  Nuclear Med Study Referring MD:  Illene Regulus

## 2010-03-25 NOTE — Letter (Signed)
Summary: Handout Printed  Printed Handout:  - Coumadin Instructions-w/out Meds 

## 2010-03-25 NOTE — Assessment & Plan Note (Signed)
Summary: dizzy/room spinning/cd   Vital Signs:  Patient profile:   77 year old male Height:      68 inches Weight:      199 pounds BMI:     30.37 O2 Sat:      96 % on Room air Temp:     97.4 degrees F oral Pulse rate:   70 / minute BP supine:   118 / 72  (left arm) BP sitting:   114 / 68  (left arm) BP standing:   106 / 68  (left arm) Cuff size:   regular  Vitals Entered By: Bill Salinas CMA (December 25, 2009 9:49 AM)  O2 Flow:  Room air CC: pt here with c/o of dizziness, pt states that he started expirancing symptoms last night and symptoms occur with movement/ ab Comments Pt no longer on lovenox or midrin/ ab   Primary Care Provider:  Jacques Navy MD  CC:  pt here with c/o of dizziness and pt states that he started expirancing symptoms last night and symptoms occur with movement/ ab.  History of Present Illness: Patient presents with the sudden on-set of dizziness. He reports it started with extending his neck for eye drops and had sudden dizziness. He reports that the bed will spin around. When on his feet he has dysequilibrium. No headache, focal weakness, paresthesia, slurred speech or cognitive trouble.   Current Medications (verified): 1)  Coumadin 5 Mg Tabs (Warfarin Sodium) .... Use As Directed Per Anticoagulation Clinic. 2)  Terazosin Hcl 5 Mg Caps (Terazosin Hcl) .... Take 1 Tablet  Every Other Day 3)  Zocor 20 Mg  Tabs (Simvastatin) .... Take 1 By Mouth Qd 4)  Proscar 5 Mg  Tabs (Finasteride) .... Take 1 By Mouth Qd 5)  Valium 5 Mg  Tabs (Diazepam) .... Take 1 Tablet By Mouth Two Times A Day 6)  Nitrostat 0.4 Mg  Subl (Nitroglycerin) .... Use Prn 7)  Prilosec 20 Mg  Cpdr (Omeprazole) .... Take One Tablet By Mouth Every Other Day 8)  Midrin 325-65-100 Mg  Caps (Apap-Isometheptene-Dichloral) .... As Needed 9)  Celebrex 200 Mg Caps (Celecoxib) .[name].. 1 By Mouth As Needed For Arthritic Pain: Required Due To Patient On Coumadin & At Higher Risk 10)  Caltrate 600 1500  Mg Tabs (Calcium Carbonate) .... Once Daily 11)  Lovenox 150 Mg/ml Soln (Enoxaparin Sodium) .... Inject One Syringe Subcutaneously Into Abdomen Daily 12)  Oxycodone-Acetaminophen 5-325 Mg Tabs (Oxycodone-Acetaminophen) .[name].. 1 To 2 Every 4 Hours As Needed Pain 13)  Flagyl 500 Mg Tabs (Metronidazole) .[name].. 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) Dx 07/27/08, s/p resection 7/10 ATRIAL FIBRILLATION, PAROXYSMAL (ICD-427.31)- postoperative after lung surgery OSTEOARTHRITIS, KNEE (ICD-715.96) LABRYNTHITIS (ICD-386.30) GERD (ICD-530.81) CEREBROVASCULAR DISEASE s/p prior stroke HYPERTENSION (ICD-401.9) HYPERLIPIDEMIA (ICD-272.4) DEPRESSION (ICD-311) CORONARY ARTERY DISEASE s/p MI 1970s  Past Surgical History: Last updated: 10/25/2009 EGD (05/14/2004) Bunionectomy '89 - left foot Inguinal herniorrhaphy-left '71 Arthroscopic surgery Right knee - '10 VAT-RML lobectomy Right TKR August '11  Family History: Last updated: 2007/05/14 father - deceased @ 46: CVA mother - deceased @93  1/2: DM, HTN Neg- colon or prostate cancer, CAD 2nd kin + for DM    Social History: Last updated: 10/19/2008 Married '70 1 son - '75 retired - Surveyor, 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 Denies Tob, ETOH, Drugs.  Review of Systems  The patient denies anorexia, fever, weight loss, weight gain, decreased hearing, hoarseness, chest pain, dyspnea on exertion, prolonged cough, hemoptysis, abdominal pain, severe indigestion/heartburn, muscle weakness, suspicious skin lesions, difficulty walking, depression, abnormal bleeding, and angioedema.    Physical Exam  General:  alert, well-developed, and well-nourished.   Head:  normocephalic and atraumatic.   Eyes:   pupils equal, pupils round, and corneas and lenses clear.   Neck:  supple and full ROM.   Lungs:  normal respiratory effort.   Heart:  normal rate and regular rhythm.   Neurologic:  alert & oriented X3, cranial nerves II-XII intact, strength normal in all extremities, DTRs symmetrical and normal, finger-to-nose normal, and heel-to-shin normal.  very poor tandem gait.   Impression & Recommendations:  Problem # 1:  LABRYNTHITIS (ICD-386.30) patient with recurrent labyrinthits. Non-focal neuro exam.  Plan - patient info from [facility]          meclizine 25mg  1/2 to 1 tab q 6  Complete Medication List: 1)  Coumadin 5 Mg Tabs (Warfarin sodium) .... Use as directed per anticoagulation clinic. 2)  Terazosin Hcl 5 Mg Caps (Terazosin hcl) .... Take 1 tablet  every other day 3)  Zocor 20 Mg Tabs (Simvastatin) .... Take 1 by mouth qd 4)  Proscar 5 Mg Tabs (Finasteride) .... Take 1 by mouth qd 5)  Valium 5 Mg Tabs (Diazepam) .... Take 1 tablet by mouth two times a day 6)  Nitrostat 0.4 Mg Subl (Nitroglycerin) .... Use prn 7)  Prilosec 20 Mg Cpdr (Omeprazole) .... Take one tablet by mouth every other day 8)  Midrin 325-65-100 Mg Caps (Apap-isometheptene-dichloral) .... As needed 9)  Celebrex 200 Mg Caps (Celecoxib) .[name].. 1 by mouth as needed for arthritic pain: required due to patient on coumadin & at higher risk 10)  Caltrate 600 1500 Mg Tabs (Calcium carbonate) .... Once daily 11)  Lovenox 150 Mg/ml Soln (Enoxaparin sodium) .... Inject one syringe subcutaneously into abdomen daily 12)  Oxycodone-acetaminophen 5-325 Mg Tabs (Oxycodone-acetaminophen) .[name].. 1 to 2 every 4 hours as needed pain 13)  Meclizine Hcl 25 Mg Tabs (Meclizine hcl) .... 1/2 or 1 tablet q 6 as needed dizziness Prescriptions: MECLIZINE HCL 25 MG TABS (MECLIZINE HCL) 1/2 or 1 tablet q 6 as needed dizziness  #30 x 1   Entered and Authorized by:   Jacques Navy MD   Signed by:   Jacques Navy MD on 12/25/2009   Method used:    Electronically to        [facility] [id number] (retail)       [address] [place], [place]  [address]       Ph: [phone]       Fax: [phone]   RxID:   [id number]    Orders Added: 1)  Est. Patient Level III [84132]

## 2010-03-25 NOTE — Medication Information (Signed)
Summary: rov/tm  Anticoagulant Therapy  Managed by: Bethena Midget, RN, BSN Referring MD: Illene Regulus PCP: Jacques Navy MD Supervising MD: Graciela Husbands MD, Viviann Spare Indication 1: TIA (Transient Ischemic Attack) (ICD-435.0) Lab Used: [facility] Site: [address] INR POC 2.2 INR RANGE 2 - 3  Dietary changes: no    Health status changes: no    Bleeding/hemorrhagic complications: no    Recent/future hospitalizations: no    Any changes in medication regimen? yes       Details: Meclazine added last week by Dr Arthur Holms.   Recent/future dental: no  Any missed doses?: no       Is patient compliant with meds? yes       Allergies: No Known Drug Allergies  Anticoagulation Management History:      The patient is taking warfarin and comes in today for a routine follow up visit.  Positive risk factors for bleeding include an age of 49 years or older.  The bleeding index is 'intermediate risk'.  Positive CHADS2 values include History of HTN.  Negative CHADS2 values include Age > 63 years old.  The start date was 05/27/1998.  His last INR was 1.9 ratio.  Anticoagulation responsible provider: Graciela Husbands MD, Viviann Spare.  INR POC: 2.2.  Cuvette Lot#: [id number].  Exp: 01/2011.    Anticoagulation Management Assessment/Plan:      The patient's current anticoagulation dose is Coumadin 5 mg tabs: Use as directed per anticoagulation clinic..  The target INR is 2 - 3.  The next INR is due 01/14/2010.  Anticoagulation instructions were given to patient.  Results were reviewed/authorized by Bethena Midget, RN, BSN.  He was notified by Bethena Midget, RN, BSN.         Prior Anticoagulation Instructions: INR 1.9 Today 5mg  then resume 2.5mg s everyday except 5mg s on Mondays, Wednesdays and Fridays. Recheck in 2 weeks.   Current Anticoagulation Instructions: INR 2.2 Continue 2.5mg s daily except 5mg s on Mondays, Wednesdays and Fridays. Recheck in 2 weeks.

## 2010-03-25 NOTE — Medication Information (Signed)
Summary: rov/tm  Anticoagulant Therapy  Managed by: Weston Brass, PharmD Referring MD: Illene Regulus PCP: Jacques Navy MD Supervising MD: Riley Kill MD, Maisie Fus Indication 1: TIA (Transient Ischemic Attack) (ICD-435.0) Lab Used: [facility] Site: [facility] INR POC 1.7 INR RANGE 2 - 3  Dietary changes: no    Health status changes: yes       Details: having knee surgery in August.  Appt set with Dr. Johney Frame for clearance prior to procedure.  Has done Lovenox briding in the past.   Bleeding/hemorrhagic complications: no    Recent/future hospitalizations: no    Any changes in medication regimen? no    Recent/future dental: no  Any missed doses?: no       Is patient compliant with meds? yes       Allergies: No Known Drug Allergies  Anticoagulation Management History:      The patient is taking warfarin and comes in today for a routine follow up visit.  Positive risk factors for bleeding include an age of 7 years or older.  The bleeding index is 'intermediate risk'.  Positive CHADS2 values include History of HTN.  Negative CHADS2 values include Age > 26 years old.  The start date was 05/27/1998.  His last INR was 1.9 ratio.  Anticoagulation responsible provider: Riley Kill MD, Maisie Fus.  INR POC: 1.7.  Cuvette Lot#: [id number].  Exp: 10/2010.    Anticoagulation Management Assessment/Plan:      The patient's current anticoagulation dose is Coumadin 5 mg tabs: Use as directed per anticoagulation clinic..  The target INR is 2 - 3.  The next INR is due 09/11/2009.  Anticoagulation instructions were given to patient.  Results were reviewed/authorized by Weston Brass, PharmD.  He was notified by Weston Brass PharmD.         Prior Anticoagulation Instructions: INR 2.1 Continue 2.5mg  daily except 5mg s on Wednesdays and Saturdays. Recheck in 4 weeks.   Current Anticoagulation Instructions: INR 1.7  Take 1 1/2 tablets today then resume same dose of 1/2 tablet every day except 1 tablet on  Wednesday and Saturday.

## 2010-03-25 NOTE — Letter (Signed)
   [facility] [address] [place], [place]  [address] Phone: [phone]      April 23, 2009   [facility] [address] [place], [place] [address]  RE:  LAB RESULTS  Dear  Mr. SCADDEN,  The following is an interpretation of your most recent lab tests.  Please take note of any instructions provided or changes to medications that have resulted from your lab work.  ELECTROLYTES:  Good - no changes needed  KIDNEY FUNCTION TESTS:  Good - no changes needed  Health professionals look at cholesterol as more involved than just the total cholesterol. We consider the level of LDL (bad) cholesterol, HDL (good), cholesterol, and Triglycerides (Grease) in the blood.  1. Your LDL should be under 100, and the HDL should be over 45, if you have any vascular disease such as heart attack, angina, stroke, TIA (mini stroke), claudication (pain in the legs when you walk due to poor circulation),  Abdominal Aortic Aneurysm (AAA), diabetes or prediabetes.  2. Your LDL should be under 130 if you have any two of the following:     a. Smoke or chew tobacco,     b. High blood pressure (if you are on medication or over 140/90 without medication),     c. Male gender,    d. HDL below 40,    e. A male relative (father, brother, or son), who have had any vascular event          as described in #1. above under the age of 38, or a male relative (mother,       sister, or daughter) who had an event as described above under age 54. (An HDL over 60 will subtract one risk factor from the total, so if you have two items in # 2 above, but an HDL over 60, you then fall into category # 3 below).  3. Your LDL should be under 160 if you have any one of the above.  Triglycerides should be under 200 with the ideal being under 150.  For diabetes or pre-diabetes, the ideal HgbA1C should be under 6.0%.  If you fall into any of the above categories, you should make a follow up appointment to discuss  this with your physician.  LIPID PANEL:  Good - no changes needed Triglyceride: 97.0   Cholesterol: 136   LDL: 72   HDL: 44.90   Chol/HDL%:  3  THYROID STUDIES:  Thyroid studies normal TSH: 2.67      Labs look good.   Sincerely Yours,    Jacques Navy MD

## 2010-03-25 NOTE — Medication Information (Signed)
Summary: rov/tm  Anticoagulant Therapy  Managed by: Leota Sauers, PharmD, BCPS, CPP Referring MD: Illene Regulus PCP: Jacques Navy MD Supervising MD: Daleen Squibb MD, Maisie Fus Indication 1: TIA (Transient Ischemic Attack) (ICD-435.0) Lab Used: [facility] Site: [facility] INR POC 2.1 INR RANGE 2 - 3  Dietary changes: no    Health status changes: no    Bleeding/hemorrhagic complications: no    Recent/future hospitalizations: no    Any changes in medication regimen? no    Recent/future dental: no  Any missed doses?: no       Is patient compliant with meds? yes       Current Medications (verified): 1)  Coumadin 5 Mg Tabs (Warfarin Sodium) .... Use As Directed Per Anticoagulation Clinic. 2)  Terazosin Hcl 5 Mg Caps (Terazosin Hcl) .... Take 1 Tablet  Every Other Day 3)  Zocor 20 Mg  Tabs (Simvastatin) .... Take 1 By Mouth Qd 4)  Proscar 5 Mg  Tabs (Finasteride) .... Take 1 By Mouth Qd 5)  Valium 5 Mg  Tabs (Diazepam) .... Take 1 Tablet By Mouth Two Times A Day 6)  Nitrostat 0.4 Mg  Subl (Nitroglycerin) .... Use Prn 7)  Prilosec 20 Mg  Cpdr (Omeprazole) .... Take One Tablet By Mouth Every Other Day 8)  Midrin 325-65-100 Mg  Caps (Apap-Isometheptene-Dichloral) .... As Needed 9)  Celebrex 200 Mg Caps (Celecoxib) .[name].. 1 By Mouth As Needed For Arthritic Pain: Required Due To Patient On Coumadin & At Higher Risk  Allergies (verified): No Known Drug Allergies  Anticoagulation Management History:      The patient is taking warfarin and comes in today for a routine follow up visit.  Positive risk factors for bleeding include an age of 75 years or older.  The bleeding index is 'intermediate risk'.  Positive CHADS2 values include History of HTN.  Negative CHADS2 values include Age > 41 years old.  The start date was 05/27/1998.  His last INR was 1.9 ratio.  Anticoagulation responsible provider: Daleen Squibb MD, Maisie Fus.  INR POC: 2.1.  Cuvette Lot#: [id number].  Exp: 06/2010.    Anticoagulation  Management Assessment/Plan:      The patient's current anticoagulation dose is Coumadin 5 mg tabs: Use as directed per anticoagulation clinic..  The target INR is 2 - 3.  The next INR is due 07/23/2009.  Anticoagulation instructions were given to patient.  Results were reviewed/authorized by Leota Sauers, PharmD, BCPS, CPP.         Prior Anticoagulation Instructions: INR 2.2 Continue 2.5mg s daily except 5mg s on Wednesdays and Saturdays.  Recheck in 4 weeks.   Current Anticoagulation Instructions: INR 2.1  Coumadin 1 tab = 5mg  on Wed and Sat 1/2 tab all other days

## 2010-03-25 NOTE — Medication Information (Signed)
Summary: rov/tm  Anticoagulant Therapy  Managed by: Weston Brass, PharmD Referring MD: Illene Regulus PCP: Jacques Navy MD Supervising MD: Clifton James MD, Cristal Deer Indication 1: TIA (Transient Ischemic Attack) (ICD-435.0) Lab Used: [facility] Site: [address] INR POC 2.3 INR RANGE 2 - 3  Dietary changes: no    Health status changes: no    Bleeding/hemorrhagic complications: no    Recent/future hospitalizations: no    Any changes in medication regimen? yes       Details: Still taking meclezine  Recent/future dental: no  Any missed doses?: no       Is patient compliant with meds? yes       Allergies: No Known Drug Allergies  Anticoagulation Management History:      The patient is taking warfarin and comes in today for a routine follow up visit.  Positive risk factors for bleeding include an age of 75 years or older.  The bleeding index is 'intermediate risk'.  Positive CHADS2 values include History of HTN.  Negative CHADS2 values include Age > 75 years old.  The start date was 05/27/1998.  His last INR was 1.9 ratio.  Anticoagulation responsible provider: Clifton James MD, Cristal Deer.  INR POC: 2.3.  Cuvette Lot#: [id number].  Exp: 01/2011.    Anticoagulation Management Assessment/Plan:      The patient's current anticoagulation dose is Coumadin 5 mg tabs: Use as directed per anticoagulation clinic..  The target INR is 2 - 3.  The next INR is due 02/11/2010.  Anticoagulation instructions were given to patient.  Results were reviewed/authorized by Weston Brass, PharmD.  He was notified by Hoy Register, PharmD Candidate.         Prior Anticoagulation Instructions: INR 2.2 Continue 2.5mg s daily except 5mg s on Mondays, Wednesdays and Fridays. Recheck in 2 weeks.   Current Anticoagulation Instructions: INR 2.3 Continue previous dose of 0.5 tablet everyday except 1 tablet on Monday, Wednesday, and Friday Recheck INR in 4 weeks

## 2010-03-25 NOTE — Progress Notes (Signed)
Summary: Surgical clearance appt-JA  Phone Note Outgoing Call   Call placed by: Sherri Rad, RN, BSN,  August 16, 2009 11:24 AM Call placed to: Patient Summary of Call: I left a message for the pt to call regarding an appt. for surgical clearance. Sherri Rad, RN, BSN  August 16, 2009 11:25 AM   Follow-up for Phone Call        returning call, will not available after 3 Migdalia Dk  August 16, 2009 11:52 AM  I called and spoke with the pt regarding his surgical clearance. I did verify that his knee surgery is scheduled for 8/29. He has an appt. with Dr. Johney Frame for 8/15. He does want to be seen sooner. I offered a PA appt for surgical clearance. The pt wants to see Dr. Johney Frame. I explained there are no openings with Dr. Johney Frame prior to his 8/15 appt. I made him aware I would leave a message for Dr. Jenel Lucks nurse, Tresa Endo, to call him on monday to see if she can work him in any sooner. The pt is agreeable. Follow-up by: Sherri Rad, RN, BSN,  August 16, 2009 12:12 PM  Additional Follow-up for Phone Call Additional follow up Details #1::        09/05/09 at 12:00.  LMOM Dennis Bast, RN, BSN  August 21, 2009 12:56 PM

## 2010-03-25 NOTE — Medication Information (Signed)
Summary: [name]  Anticoagulant Therapy  Managed by: Weston Brass, PharmD Referring MD: Illene Regulus PCP: Jacques Navy MD Supervising MD: Antoine Poche MD, Fayrene Fearing Indication 1: TIA (Transient Ischemic Attack) (ICD-435.0) Lab Used: [facility] Site: [facility] INR POC 2.1 INR RANGE 2 - 3  Dietary changes: no    Health status changes: no    Bleeding/hemorrhagic complications: no    Recent/future hospitalizations: yes       Details: having knee replacement on Monday.  Will start Lovenox tomorrow.  Pt and wife instructed on Lovenox injection technique  Any changes in medication regimen? no    Recent/future dental: no  Any missed doses?: yes     Details: skipped yesterday's dose  Is patient compliant with meds? yes       Allergies: No Known Drug Allergies  Anticoagulation Management History:      The patient is taking warfarin and comes in today for a routine follow up visit.  Positive risk factors for bleeding include an age of 75 years or older.  The bleeding index is 'intermediate risk'.  Positive CHADS2 values include History of HTN.  Negative CHADS2 values include Age > 75 years old.  The start date was 05/27/1998.  His last INR was 1.9 ratio.  Anticoagulation responsible provider: Antoine Poche MD, Fayrene Fearing.  INR POC: 2.1.  Exp: 11/2010.    Anticoagulation Management Assessment/Plan:      The patient's current anticoagulation dose is Coumadin 5 mg tabs: Use as directed per anticoagulation clinic..  The target INR is 2 - 3.  The next INR is due 11/01/2009.  Anticoagulation instructions were given to patient.  Results were reviewed/authorized by Weston Brass, PharmD.  He was notified by Weston Brass PharmD.         Prior Anticoagulation Instructions: INR 2.7  Continue taking 1/2 tablet (2.5mg ) every day except take 1 tablet on Mon, Wed, and Sat.  Take your last dose of Coumadin on August 24th.  Take nothing on the August 25th.  Start injections on 8/26.     Current Anticoagulation  Instructions: INR 2.1  Continue to hold Coumadin.  Take Lovenox 150mg  once daily on Saturday and Sunday morning.  Contact our office when discharged from the hospital.

## 2010-03-25 NOTE — Letter (Signed)
Summary: [facility]  [facility]   Imported By: Sherian Rein 11/12/2009 10:41:01  _____________________________________________________________________  External Attachment:    Type:   Image     Comment:   External Document

## 2010-03-25 NOTE — Medication Information (Signed)
Summary: Coumadin Clinic  Anticoagulant Therapy  Managed by: Rolland Porter, PharmD Referring MD: Illene Regulus PCP: Jacques Navy MD Supervising MD: Tenny Craw MD, Gunnar Fusi Indication 1: TIA (Transient Ischemic Attack) (ICD-435.0) Lab Used: [facility] Site: [address] PT 31.0 INR POC 3.1 INR RANGE 2 - 3  Dietary changes: yes       Details: started eating solid food again over weekend and increased diarrhea  Health status changes: yes       Details: continued diarrhea  Bleeding/hemorrhagic complications: no    Recent/future hospitalizations: no    Any changes in medication regimen? yes       Details: Flagyl x 10 days refill starting today, has been on 10/25/09  Recent/future dental: no  Any missed doses?: no        Comments: Patient held dose on Friday, took a whole tablet on Saturday and half tablet on Sunday.  Current Medications (verified): 1)  Coumadin 5 Mg Tabs (Warfarin Sodium) .... Use As Directed Per Anticoagulation Clinic. 2)  Terazosin Hcl 5 Mg Caps (Terazosin Hcl) .... Take 1 Tablet  Every Other Day 3)  Zocor 20 Mg  Tabs (Simvastatin) .... Take 1 By Mouth Qd 4)  Proscar 5 Mg  Tabs (Finasteride) .... Take 1 By Mouth Qd 5)  Valium 5 Mg  Tabs (Diazepam) .... Take 1 Tablet By Mouth Two Times A Day 6)  Nitrostat 0.4 Mg  Subl (Nitroglycerin) .... Use Prn 7)  Prilosec 20 Mg  Cpdr (Omeprazole) .... Take One Tablet By Mouth Every Other Day 8)  Midrin 325-65-100 Mg  Caps (Apap-Isometheptene-Dichloral) .... As Needed 9)  Celebrex 200 Mg Caps (Celecoxib) .[name].. 1 By Mouth As Needed For Arthritic Pain: Required Due To Patient On Coumadin & At Higher Risk 10)  Caltrate 600 1500 Mg Tabs (Calcium Carbonate) .... Once Daily 11)  Lovenox 150 Mg/ml Soln (Enoxaparin Sodium) .... Inject One Syringe Subcutaneously Into Abdomen Daily 12)  Oxycodone-Acetaminophen 5-325 Mg Tabs (Oxycodone-Acetaminophen) .[name].. 1 To 2 Every 4 Hours As Needed Pain 13)  Methocarbamol 500 Mg Tabs  (Methocarbamol) .[name].. 1 By Mouth Once Daily Every 6 Hours As Needed 14)  Metronidazole 500 Mg Tabs (Metronidazole) .[name].. 1 By Mouth Three Times A Day For Possible C. Diff  Allergies (verified): No Known Drug Allergies  Anticoagulation Management History:      His anticoagulation is being managed by telephone today.  Positive risk factors for bleeding include an age of 25 years or older.  The bleeding index is 'intermediate risk'.  Positive CHADS2 values include History of HTN.  Negative CHADS2 values include Age > 56 years old.  The start date was 05/27/1998.  His last INR was 1.9 ratio.  Prothrombin time is 31.0.  Anticoagulation responsible provider: Tenny Craw MD, Gunnar Fusi.  INR POC: 3.1.    Anticoagulation Management Assessment/Plan:      The patient's current anticoagulation dose is Coumadin 5 mg tabs: Use as directed per anticoagulation clinic..  The target INR is 2 - 3.  The next INR is due 11/08/2009.  Anticoagulation instructions were given to spouse.  Results were reviewed/authorized by Rolland Porter, PharmD.  He was notified by Rolland Porter.         Prior Anticoagulation Instructions: INR 3.9 Skip today then resume 2.5mg s daily except 5mg s on M, W and Sat.   Current Anticoagulation Instructions: INR 3.1  Take 2.5 mg daily except 5 mg on Wednesday, recheck on Friday.

## 2010-03-25 NOTE — Letter (Signed)
Summary: [facility] Clearance/[facility]  Medical Clearance/[facility]   Imported By: Sherian Rein 04/22/2009 08:18:41  _____________________________________________________________________  External Attachment:    Type:   Image     Comment:   External Document

## 2010-03-25 NOTE — Progress Notes (Signed)
Summary: COUMDIN CLINIC  Phone Note From Other Clinic   Caller: ext [phone] Triffany - Coumadin clinic Summary of Call: Coumadin clinic called - patient it there now. Pt's INR is 4.8 today; up from 1.8 Friday. Pt is on flagyl and coumadin clinic wants to know if he can stop med due to negative cdiff? Per wife - Pt has persistent diarrhea.  Initial call taken by: Lamar Sprinkles, CMA,  October 29, 2009 3:28 PM  Follow-up for Phone Call        needs to continue flagyl Follow-up by: Jacques Navy MD,  October 29, 2009 5:36 PM  Additional Follow-up for Phone Call Additional follow up Details #1::        Coumadin clinic was informed earlier today Additional Follow-up by: Lamar Sprinkles, CMA,  October 29, 2009 5:40 PM

## 2010-03-25 NOTE — Letter (Signed)
[facility]   Imported By: Sherian Rein 10/16/2009 09:22:10  _____________________________________________________________________  External Attachment:    Type:   Image     Comment:   External Document

## 2010-03-25 NOTE — Medication Information (Signed)
Summary: rov/tm  Anticoagulant Therapy  Managed by: Shelby Dubin, PharmD, BCPS, CPP Referring MD: Illene Regulus PCP: Jacques Navy MD Supervising MD: Myrtis Ser MD, Tinnie Gens Indication 1: TIA (Transient Ischemic Attack) (ICD-435.0) Lab Used: [facility] Site: [facility] INR POC 1.9 INR RANGE 2 - 3  Dietary changes: no    Health status changes: no    Bleeding/hemorrhagic complications: no    Recent/future hospitalizations: no    Any changes in medication regimen? no    Recent/future dental: no  Any missed doses?: no       Is patient compliant with meds? yes       Allergies (verified): No Known Drug Allergies  Anticoagulation Management History:      The patient is taking warfarin and comes in today for a routine follow up visit.  Positive risk factors for bleeding include an age of 75 years or older.  The bleeding index is 'intermediate risk'.  Positive CHADS2 values include History of HTN.  Negative CHADS2 values include Age > 75 years old.  The start date was 05/27/1998.  His last INR was 1.9 ratio.  Anticoagulation responsible provider: Myrtis Ser MD, Tinnie Gens.  INR POC: 1.9.  Cuvette Lot#: [id number].  Exp: 03/2009.    Anticoagulation Management Assessment/Plan:      The patient's current anticoagulation dose is Coumadin 5 mg tabs: Use as directed per anticoagulation clinic..  The target INR is 2 - 3.  The next INR is due 04/02/2009.  Anticoagulation instructions were given to patient.  Results were reviewed/authorized by Shelby Dubin, PharmD, BCPS, CPP.  He was notified by Shelby Dubin PharmD, BCPS, CPP.         Prior Anticoagulation Instructions: INR 1.9 Today 5mg s then resume 2.5mg s daily except 5mg s on Wednesdays. Recheck in 3 weeks.   Current Anticoagulation Instructions: INR 1.9  Take 1 tab today, then 1 tab each Wednesday and Saturday and 0.5 tab on all other days.  Recheck in 3 weeks.

## 2010-03-25 NOTE — Assessment & Plan Note (Signed)
Summary: flu shot/men/cd  Nurse Visit   Allergies: No Known Drug Allergies  Orders Added: 1)  Flu Vaccine 69yrs + MEDICARE PATIENTS [Q2039] 2)  Administration Flu vaccine - MCR [G0008] .lbmedflu   Flu Vaccine Consent Questions     Do you have a history of severe allergic reactions to this vaccine? no    Any prior history of allergic reactions to egg and/or gelatin? no    Do you have a sensitivity to the preservative Thimersol? no    Do you have a past history of Guillan-Barre Syndrome? no    Do you currently have an acute febrile illness? no    Have you ever had a severe reaction to latex? no    Vaccine information given and explained to patient? yes    Are you currently pregnant? no    Lot Number:[id number]   Exp Date:08/23/2010   Site Given  Left Deltoid IM Lanier Prude, [facility])  December 11, 2009 11:53 AM

## 2010-03-25 NOTE — Progress Notes (Signed)
Summary: C DIff sxs  Phone Note Call from Patient Call back at Home Phone [phone]   Caller: Spouse Summary of Call: Pt's spouse called stating that pt was doing better in ABX until this weekend when he ate soild food. Pt has been experiencing diarrhea since Saturday and per wife, no fever, mild abd pain and stool is vey loose and "slimy". Pt is requesting additional course of ABX. Please advise Initial call taken by: Margaret Pyle, CMA,  November 04, 2009 11:49 AM  Follow-up for Phone Call        OK for second course of flagyl 500mg  three times a day, 10 days, #30 Follow-up by: Jacques Navy MD,  November 04, 2009 1:16 PM  Additional Follow-up for Phone Call Additional follow up Details #1::        informed pt's wife Additional Follow-up by: Ami Bullins CMA,  November 04, 2009 5:36 PM    New/Updated Medications: FLAGYL 500 MG TABS (METRONIDAZOLE) 1 tab three times a day Prescriptions: FLAGYL 500 MG TABS (METRONIDAZOLE) 1 tab three times a day  #30 x 0   Entered by:   Ami Bullins CMA   Authorized by:   Jacques Navy MD   Signed by:   Bill Salinas CMA on 11/04/2009   Method used:   Electronically to        [id number] (retail)       [address] [place], [place]  [address]       Ph: [phone]       Fax: [phone]   RxID:   [id number]

## 2010-03-25 NOTE — Progress Notes (Signed)
  Phone Note Call from Patient Call back at [facility] Phone [phone]   Summary of Call: Pt's wife called. Pt was d/c'd from hosptial yesterday. He was on antibiotic while in hospital per wife. Pt c/o abdominal swelling and frequent loose watery stools. She feels unable to bring patient in the office for eval. Please advise.  Initial call taken by: Lamar Sprinkles, CMA,  October 25, 2009 9:31 AM  Follow-up for Phone Call        Per MD, need stool studies for cdiff. Pt's wife informed. He has not taken any otc meds for diarrhea. Please advise, what is pt to do while waiting on results.  Follow-up by: Lamar Sprinkles, CMA,  October 25, 2009 10:23 AM  Additional Follow-up for Phone Call Additional follow up Details #1::        May take metamucil or equivalent once or twice a day. Be sure to drink 8 oz liquid for every loose stool in addition to routine hydration. Additional Follow-up by: Jacques Navy MD,  October 25, 2009 2:05 PM  New Problems: DIARRHEA (ICD-787.91)   Additional Follow-up for Phone Call Additional follow up Details #2::    Pt's wife informed, pt is comming in for office visit today, [facility] health rn has concerns regarding pt's abd.  Follow-up by: Lamar Sprinkles, CMA,  October 25, 2009 3:10 PM  New Problems: DIARRHEA [phone])

## 2010-03-25 NOTE — Assessment & Plan Note (Signed)
Summary: Cardiology Nuclear Study  Nuclear Med Background Indications for Stress Test: Evaluation for Ischemia, Surgical Clearance  Indications Comments: Pending (R) TKR 10/21/09  History: Myocardial Infarction, Myocardial Perfusion Study  History Comments: '76 MI '03 MPS: EF=64%     Nuclear Pre-Procedure Cardiac Risk Factors: CVA, Hypertension, Lipids, TIA Caffeine/Decaff Intake: None NPO After: 11:00 PM Lungs: clear IV 0.9% NS with Angio Cath: 20g     IV Site: (R) AC IV Started by: Irean Hong RN Chest Size (in) 44     Height (in): 68 Weight (lb): 205 BMI: 31.28  Nuclear Med Study 1 or 2 day study:  1 day     Stress Test Type:  Eugenie Birks Reading MD:  Olga Millers, MD     Referring MD:  Illene Regulus Resting Radionuclide:  Technetium 21m Tetrofosmin     Resting Radionuclide Dose:  11.0 mCi  Stress Radionuclide:  Technetium 63m Tetrofosmin     Stress Radionuclide Dose:  33.0 mCi   Stress Protocol   Lexiscan: 0.4 mg   Stress Test Technologist:  Milana Na EMT-P     Nuclear Technologist:  Burna Mortimer Deal RT-N  Rest Procedure  Myocardial perfusion imaging was performed at rest 45 minutes following the intravenous administration of Myoview Technetium 64m Tetrofosmin.  Stress Procedure  The patient received IV Lexiscan 0.4 mg over 15-seconds.  Myoview injected at 30-seconds.  There were no significant changes with infusion.  Quantitative spect images were obtained after a 45 minute delay.  QPS Raw Data Images:  Acuisition technically good; normal left ventricular size. Stress Images:  There is decreased uptake in the apex. Rest Images:  There is decreased uptake in the apex. Subtraction (SDS):  There is a fixed defect that is most consistent with a previous infarction. Transient Ischemic Dilatation:  1.03  (Normal <1.22)  Lung/Heart Ratio:  .35  (Normal <0.45)  Quantitative Gated Spect Images QGS EDV:  104 ml QGS ESV:  35 ml QGS EF:  67 % QGS cine images:   Apical hypokinesis.   Overall Impression  Exercise Capacity: Lexiscan study with no exercise. BP Response: Normal blood pressure response. Clinical Symptoms: There is chest pain ECG Impression: No significant ST segment change suggestive of ischemia. Overall Impression: Abnormal lexiscan nuclear study with prior apical infarct; no ischemia.  Appended Document: Cardiology Nuclear Study pt aware send ok for surgery to Dr Lequita Halt

## 2010-03-25 NOTE — Letter (Signed)
Summary: [facility]   Imported By: Roderic Ovens 09/23/2009 12:25:23  _____________________________________________________________________  External Attachment:    Type:   Image     Comment:   External Document

## 2010-03-25 NOTE — Progress Notes (Signed)
Summary: Call Report  Phone Note Other Incoming   Caller: [facility]  Summary of Call: [facility] Triage Call Report Triage Record Num: [id number] Operator: Valene Bors Patient Name: Bernard [name] Call Date & Time: 10/25/2009 9:14:18PM Patient Phone: [phone] PCP: Illene Regulus Patient Gender: Male PCP Fax : [phone] Patient DOB: [birth year] Practice Name: [facility] Reason for Call: Wife/[name] is calling about [name] having just returned home from being in the hospital for a knee replacement and he is having diarrhea and was to start on an antibiotic today to tx C-diff. Dr. [name] called in antibiotic electronically but when Wife called it was not at Pharmacy. MD on call contacted, Dr. Neena Rhymes, and she checked his record/office visit for today 10/25/09 and noticed that it had not been signed. Medication called to [facility] on [address] [phone] and spoke to Chase,RPh. Flagyl 500mg s 1 PO TID #30, no refills/per Dr. Natalia Leatherwood Tabori/Jeannene Kirch,RN/CAN Protocol(s) Used: Office Note Recommended Outcome per Protocol: Information Noted and Sent to Office Reason for Outcome: Caller information to office Care Advice:  ~ 09/ Initial call taken by: Margaret Pyle, CMA,  October 29, 2009 8:00 AM

## 2010-03-25 NOTE — Progress Notes (Signed)
Summary: lab results  Phone Note Call from Patient Call back at Home Phone [phone]   Summary of Call: Patient spouse left message on triage that patient had labs done at [facility] and would like MD to review and advise.   Initial call taken by: Lucious Groves,  April 17, 2009 11:44 AM  Follow-up for Phone Call        could not pull up any labs in e-chart Follow-up by: Jacques Navy MD,  April 18, 2009 9:23 AM  Additional Follow-up for Phone Call Additional follow up Details #1::        Pt was supposed to come in for cholesterol and other labs this month. Wife was in the office an says Dr [name] gave her written rx for pt to take to oncologist so he did not have to come back into office. Only labs they drew were scanned into EMR, see date 2/04. No other labs (except cbc by oncologist) were ordered per that lab.   Per wife, pt does not have any upcomming apts with any drs. Please advise.  Additional Follow-up by: Lamar Sprinkles, CMA,  April 18, 2009 9:54 AM    Additional Follow-up for Phone Call Additional follow up Details #2::    come in for lipid panel 272.4, PSA v76.44, TSH 272.4 Follow-up by: Jacques Navy MD,  April 18, 2009 1:39 PM  Additional Follow-up for Phone Call Additional follow up Details #3:: Details for Additional Follow-up Action Taken: Pt informed, please put order in IDX, THANKS  Orders in IDX.Marland KitchenMarland KitchenMarland KitchenVerdell Face Additional Follow-up by: Lamar Sprinkles, CMA,  April 19, 2009 6:06 PM

## 2010-03-26 ENCOUNTER — Ambulatory Visit (HOSPITAL_COMMUNITY)
Admission: RE | Admit: 2010-03-26 | Discharge: 2010-03-26 | Disposition: A | Discharge status: Home / Self Care | Payer: 59 | Source: Ambulatory Visit | Attending: Internal Medicine | Admitting: Internal Medicine

## 2010-03-26 ENCOUNTER — Encounter (HOSPITAL_COMMUNITY): Payer: Self-pay

## 2010-03-26 DIAGNOSIS — C3490 Malignant neoplasm of unspecified part of unspecified bronchus or lung: Secondary | ICD-10-CM

## 2010-03-26 DIAGNOSIS — M47814 Spondylosis without myelopathy or radiculopathy, thoracic region: Secondary | ICD-10-CM | POA: Insufficient documentation

## 2010-03-26 DIAGNOSIS — Q619 Cystic kidney disease, unspecified: Secondary | ICD-10-CM | POA: Insufficient documentation

## 2010-03-26 DIAGNOSIS — D3A00 Benign carcinoid tumor of unspecified site: Secondary | ICD-10-CM

## 2010-03-26 MED ORDER — IOHEXOL 300 MG/ML  SOLN
80.0000 mL | Freq: Once | INTRAMUSCULAR | Status: AC | PRN
  Administered 2010-03-26: 80 mL via INTRAVENOUS

## 2010-03-27 ENCOUNTER — Ambulatory Visit: Admit: 2010-03-27 | Payer: Self-pay

## 2010-03-27 ENCOUNTER — Encounter (INDEPENDENT_AMBULATORY_CARE_PROVIDER_SITE_OTHER): Payer: 59

## 2010-03-27 ENCOUNTER — Encounter: Payer: Self-pay | Admitting: Cardiology

## 2010-03-27 DIAGNOSIS — G450 Vertebro-basilar artery syndrome: Secondary | ICD-10-CM

## 2010-03-27 DIAGNOSIS — Z7901 Long term (current) use of anticoagulants: Secondary | ICD-10-CM

## 2010-03-27 NOTE — Medication Information (Signed)
Summary: rov/nb  Anticoagulant Therapy  Managed by: Louann Sjogren, PharmD Referring MD: Illene Regulus PCP: Jacques Navy MD Supervising MD: Daleen Squibb MD,Thomas Indication 1: TIA (Transient Ischemic Attack) (ICD-435.0) Lab Used: [facility] [place] Site: [address] INR POC 3.4 INR RANGE 2 - 3  Dietary changes: no    Health status changes: no    Bleeding/hemorrhagic complications: no    Recent/future hospitalizations: no    Any changes in medication regimen? no    Recent/future dental: no  Any missed doses?: no       Is patient compliant with meds? yes       Allergies: No Known Drug Allergies  Anticoagulation Management History:      The patient is taking warfarin and comes in today for a routine follow up visit.  Positive risk factors for bleeding include an age of 75 years or older.  The bleeding index is 'intermediate risk'.  Positive CHADS2 values include History of HTN.  Negative CHADS2 values include Age > 59 years old.  The start date was 05/27/1998.  His last INR was 1.9 ratio and today's INR is 3.4.  Anticoagulation responsible provider: Wall MD,Thomas.  INR POC: 3.4.  Cuvette Lot#: [id number].  Exp: 01/2011.    Anticoagulation Management Assessment/Plan:      The patient's current anticoagulation dose is Coumadin 5 mg tabs: Use as directed per anticoagulation clinic..  The target INR is 2 - 3.  The next INR is due 02/26/2010.  Anticoagulation instructions were given to patient.  Results were reviewed/authorized by Louann Sjogren, PharmD.         Prior Anticoagulation Instructions: INR 2.3 Continue previous dose of 0.5 tablet everyday except 1 tablet on Monday, Wednesday, and Friday Recheck INR in 4 weeks  Current Anticoagulation Instructions: INR 3.4 (goal 2-3)  Take 1/2 tablet today (12/20) and tomorrow (12/21).  Have a serving of greens (coleslaw) tonight.  Then return to normal schedule of 1/2 tablet everyday except take 1 tablet on  Mondays, Wednesdays, and Fridays.  Return to clinic in 2 weeks on Wednesday, January 4th at 2:15PM.

## 2010-03-27 NOTE — Assessment & Plan Note (Signed)
Summary: pt does NOT want cpx,f/u stroke/cd   Vital Signs:  Patient profile:   77 year old male Height:      72 inches Weight:      201 pounds BMI:     27.36 Temp:     98.0 degrees F oral Pulse rate:   62 / minute BP sitting:   112 / 68  (left arm) Cuff size:   regular  Vitals Entered By: Lamar Sprinkles, CMA (March 06, 2010 11:09 AM) CC: 1. wants labs. 2. continues to have dizzyness 3. C/o bilat leg swelling 4. Needs rfs of all meds 90 day supply printed. /SD   Primary Care Provider:  Jacques Navy MD  CC:  1. wants labs. 2. continues to have dizzyness 3. C/o bilat leg swelling 4. Needs rfs of all meds 90 day supply printed. /SD.  History of Present Illness: Mr. Altland presents today for a problem with fluid retention in his legs. He will have progressive swelling during the day in the distal lower extremity. He will have a sock line. He has no history of renal failure or CHF.  He still has a problem with dizziness that is worse in the AM when he first gets up. He will "straighten" out in about 30 minutes.   Current Medications (verified): 1)  Coumadin 5 Mg Tabs (Warfarin Sodium) .... Use As Directed Per Anticoagulation Clinic. 2)  Terazosin Hcl 5 Mg Caps (Terazosin Hcl) .... Take 1 Tablet  Every Other Day 3)  Zocor 20 Mg  Tabs (Simvastatin) .... Take 1 By Mouth Qd 4)  Proscar 5 Mg  Tabs (Finasteride) .... Take 1 By Mouth Qd 5)  Valium 5 Mg  Tabs (Diazepam) .... Take 1 Tablet By Mouth Two Times A Day 6)  Nitrostat 0.4 Mg  Subl (Nitroglycerin) .... Use Prn 7)  Prilosec 20 Mg  Cpdr (Omeprazole) .... Take One Tablet By Mouth Every Other Day 8)  Midrin 325-65-100 Mg  Caps (Apap-Isometheptene-Dichloral) .... As Needed 9)  Caltrate 600 1500 Mg Tabs (Calcium Carbonate) .... Once Daily 10)  Oxycodone-Acetaminophen 5-325 Mg Tabs (Oxycodone-Acetaminophen) .[name].. 1 To 2 Every 4 Hours As Needed Pain 11)  Meclizine Hcl 25 Mg Tabs (Meclizine Hcl) .... 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) Dx 07/27/08, s/p resection 7/10 ATRIAL FIBRILLATION, PAROXYSMAL (ICD-427.31)- postoperative after lung surgery OSTEOARTHRITIS, KNEE (ICD-715.96) LABRYNTHITIS (ICD-386.30) GERD (ICD-530.81) CEREBROVASCULAR DISEASE s/p prior stroke HYPERTENSION (ICD-401.9) HYPERLIPIDEMIA (ICD-272.4) DEPRESSION (ICD-311) CORONARY ARTERY DISEASE s/p MI 1970s  Past Surgical History: Last updated: 10/25/2009 EGD (05/14/2004) Bunionectomy '89 - left foot Inguinal herniorrhaphy-left '71 Arthroscopic surgery Right knee - '10 VAT-RML lobectomy Right TKR August '11 FH reviewed for relevance, SH/Risk Factors reviewed for relevance  Review of Systems  The patient denies anorexia, fever, decreased hearing, syncope, dyspnea on exertion, headaches, abdominal pain, severe indigestion/heartburn, muscle weakness, difficulty walking, abnormal bleeding, and angioedema.    Physical Exam  General:  WNWD white male Head:  normocephalic and atraumatic.   Eyes:  pupils equal, pupils round, and corneas and lenses clear.   Neck:  supple.   Lungs:  normal respiratory effort, normal breath sounds, no crackles, and no wheezes.   Heart:  normal rate, regular rhythm, and no JVD.   Abdomen:  soft, normal bowel sounds, no guarding, and no rigidity.   Msk:  well healed scar line right knee.  Pulses:  2+ radial pulses Extremities:  1+ pitting edema ankle with trace at the knee Neurologic:  alert & oriented X3, cranial nerves II-XII intact, and gait normal.   Skin:  turgor normal, color normal, no rashes, no suspicious lesions, and no ulcerations.   Psych:  Oriented X3, normally interactive, good eye contact, and not anxious appearing.     Impression & Recommendations:  Problem # 1:  VENOUS  INSUFFICIENCY, LEGS (ICD-459.81) Patient with peripheral edema c/w venous insufficiency.Full explanation provided along with cartoon.  Plan - elevation  of leg           otc support stockings.  Complete Medication List: 1)  Coumadin 5 Mg Tabs (Warfarin sodium) .... Use as directed per anticoagulation clinic. 2)  Terazosin Hcl 5 Mg Caps (Terazosin hcl) .... Take 1 tablet  every other day 3)  Zocor 20 Mg Tabs (Simvastatin) .... Take 1 by mouth qd 4)  Proscar 5 Mg Tabs (Finasteride) .... Take 1 by mouth qd 5)  Valium 5 Mg Tabs (Diazepam) .... Take 1 tablet by mouth two times a day 6)  Nitrostat 0.4 Mg Subl (Nitroglycerin) .... Use prn 7)  Prilosec 20 Mg Cpdr (Omeprazole) .... Take one tablet by mouth every other day 8)  Midrin 325-65-100 Mg Caps (Apap-isometheptene-dichloral) .... One daily as needed 9)  Caltrate 600 1500 Mg Tabs (Calcium carbonate) .... Once daily 10)  Oxycodone-acetaminophen 5-325 Mg Tabs (Oxycodone-acetaminophen) .[name].. 1 to 2 every 4 hours as needed pain 11)  Meclizine Hcl 25 Mg Tabs (Meclizine hcl) .... 1/2 or 1 tablet q 6 as needed dizziness  Other Orders: TLB-CBC Platelet - w/Differential (85025-CBCD) TLB-Lipid Panel (80061-LIPID) TLB-Hepatic/Liver Function Pnl (80076-HEPATIC) TLB-BMP (Basic Metabolic Panel-BMET) (80048-METABOL) Prescriptions: MECLIZINE HCL 25 MG TABS (MECLIZINE HCL) 1/2 or 1 tablet q 6 as needed dizziness  #90 x 3   Entered by:   Lamar Sprinkles, CMA   Authorized by:   Jacques Navy MD   Signed by:   Lamar Sprinkles, CMA on 03/06/2010   Method used:   Print then Give to Patient   RxID:   [id number] PRILOSEC 20 MG  CPDR (OMEPRAZOLE) Take one tablet by mouth every other day  #45 x 3   Entered by:   Lamar Sprinkles, CMA   Authorized by:   Jacques Navy MD   Signed by:   Lamar Sprinkles, CMA on 03/06/2010   Method used:   Print then Give to Patient   RxID:   [id number] NITROSTAT 0.4 MG  SUBL (NITROGLYCERIN) use prn  #75 x 3   Entered  by:   Lamar Sprinkles, CMA   Authorized by:   Jacques Navy MD   Signed by:   Lamar Sprinkles, CMA on 03/06/2010   Method used:   Print then Give to Patient   RxID:   [id number] VALIUM 5 MG  TABS (DIAZEPAM) Take 1 tablet by mouth two times a day  #180 x 1   Entered by:   Lamar Sprinkles, CMA   Authorized by:   Jacques Navy MD   Signed by:   Lamar Sprinkles, CMA on 03/06/2010   Method used:   Print then Give to Patient   RxID:   [id number] PROSCAR 5 MG  TABS (FINASTERIDE) take 1 by mouth qd  #90 x 3   Entered by:   Lamar Sprinkles, CMA   Authorized by:   Jacques Navy MD   Signed by:  Lamar Sprinkles, CMA on 03/06/2010   Method used:   Print then Give to Patient   RxID:   [id number] ZOCOR 20 MG  TABS (SIMVASTATIN) take 1 by mouth qd  #90 x 3   Entered by:   Lamar Sprinkles, CMA   Authorized by:   Jacques Navy MD   Signed by:   Lamar Sprinkles, CMA on 03/06/2010   Method used:   Print then Give to Patient   RxID:   [id number] TERAZOSIN HCL 5 MG CAPS (TERAZOSIN HCL) Take 1 tablet  every other day  #90 x 3   Entered by:   Lamar Sprinkles, CMA   Authorized by:   Jacques Navy MD   Signed by:   Lamar Sprinkles, CMA on 03/06/2010   Method used:   Print then Give to Patient   RxID:   [id number] COUMADIN 5 MG TABS (WARFARIN SODIUM) Use as directed per anticoagulation clinic.  #90 x 3   Entered by:   Lamar Sprinkles, CMA   Authorized by:   Jacques Navy MD   Signed by:   Lamar Sprinkles, CMA on 03/06/2010   Method used:   Print then Give to Patient   RxID:   [id number] MIDRIN 325-65-100 MG  CAPS (APAP-ISOMETHEPTENE-DICHLORAL) one daily as needed Brand medically necessary #90 x 3   Entered by:   Lamar Sprinkles, CMA   Authorized by:   Jacques Navy MD   Signed by:   Lamar Sprinkles, CMA on 03/06/2010   Method used:   Print then Give to Patient   RxID:   [id number]    Orders Added: 1)  TLB-CBC Platelet - w/Differential [85025-CBCD] 2)  TLB-Lipid  Panel [80061-LIPID] 3)  TLB-Hepatic/Liver Function Pnl [80076-HEPATIC] 4)  TLB-BMP (Basic Metabolic Panel-BMET) [80048-METABOL] 5)  Est. Patient Level III [60109]

## 2010-03-27 NOTE — Medication Information (Signed)
Summary: rov  Anticoagulant Therapy  Managed by: Bethena Midget, RN, BSN Referring MD: Illene Regulus PCP: Jacques Navy MD Supervising MD: Antoine Poche MD, Fayrene Fearing Indication 1: TIA (Transient Ischemic Attack) (ICD-435.0) Lab Used: [facility] [place] Site: [address] INR POC 2.5 INR RANGE 2 - 3  Dietary changes: no    Health status changes: no    Bleeding/hemorrhagic complications: no    Recent/future hospitalizations: no    Any changes in medication regimen? no    Recent/future dental: no  Any missed doses?: no       Is patient compliant with meds? yes       Allergies: No Known Drug Allergies  Anticoagulation Management History:      The patient is taking warfarin and comes in today for a routine follow up visit.  Positive risk factors for bleeding include an age of 75 years or older.  The bleeding index is 'intermediate risk'.  Positive CHADS2 values include History of HTN and Age > 75 years old.  The start date was 05/27/1998.  His last INR was 3.4.  Anticoagulation responsible provider: Antoine Poche MD, Fayrene Fearing.  INR POC: 2.5.  Cuvette Lot#: [id number].  Exp: 03/2011.    Anticoagulation Management Assessment/Plan:      The patient's current anticoagulation dose is Coumadin 5 mg tabs: Use as directed per anticoagulation clinic..  The target INR is 2 - 3.  The next INR is due 03/26/2010.  Anticoagulation instructions were given to patient.  Results were reviewed/authorized by Bethena Midget, RN, BSN.  He was notified by Bethena Midget, RN, BSN.         Prior Anticoagulation Instructions: INR 3.4 (goal 2-3)  Take 1/2 tablet today (12/20) and tomorrow (12/21).  Have a serving of greens (coleslaw) tonight.  Then return to normal schedule of 1/2 tablet everyday except take 1 tablet on Mondays, Wednesdays, and Fridays.  Return to clinic in 2 weeks on Wednesday, January 4th at 2:15PM.  Current Anticoagulation Instructions: INR 2.5 Continue 1/2 pill everyday except 1  pill on Mondays, Wednesdays and Fridays. Recheck in 4 weeks.

## 2010-03-27 NOTE — Progress Notes (Signed)
Summary: Lab Results  Phone Note Call from Patient   Caller: Spouse Summary of Call:  pt is requesting lab results from 03-06-10. Please advise Initial call taken by: Lanier Prude, [facility]),  March 17, 2010 9:25 AM  Follow-up for Phone Call        yes I am behind.  Labs are normal: chol126, HDL 36, LDL 73 glucose 106 and everything else is normal. Please send him a copy of the labs.  Follow-up by: Jacques Navy MD,  March 17, 2010 1:27 PM  Additional Follow-up for Phone Call Additional follow up Details #1::        Pt informed - labs mailed Additional Follow-up by: Lamar Sprinkles, CMA,  March 17, 2010 4:23 PM

## 2010-04-02 ENCOUNTER — Other Ambulatory Visit: Payer: Self-pay | Admitting: Internal Medicine

## 2010-04-02 ENCOUNTER — Encounter: Payer: Self-pay | Admitting: Internal Medicine

## 2010-04-02 ENCOUNTER — Telehealth: Payer: Self-pay | Admitting: Internal Medicine

## 2010-04-02 ENCOUNTER — Encounter (HOSPITAL_BASED_OUTPATIENT_CLINIC_OR_DEPARTMENT_OTHER): Payer: MEDICARE | Admitting: Internal Medicine

## 2010-04-02 DIAGNOSIS — C3490 Malignant neoplasm of unspecified part of unspecified bronchus or lung: Secondary | ICD-10-CM

## 2010-04-02 DIAGNOSIS — C342 Malignant neoplasm of middle lobe, bronchus or lung: Secondary | ICD-10-CM

## 2010-04-02 NOTE — Medication Information (Signed)
Summary: rov/ewj  Anticoagulant Therapy  Managed by: Cloyde Reams, RN, BSN Referring MD: Illene Regulus PCP: Jacques Navy MD Supervising MD: Shirlee Latch MD, Medford Staheli Indication 1: TIA (Transient Ischemic Attack) (ICD-435.0) Lab Used: [facility] [place] Site: [address] INR POC 1.8 INR RANGE 2 - 3  Dietary changes: no    Health status changes: no    Bleeding/hemorrhagic complications: no    Recent/future hospitalizations: no    Any changes in medication regimen? no    Recent/future dental: no  Any missed doses?: no       Is patient compliant with meds? yes       Allergies: No Known Drug Allergies  Anticoagulation Management History:      The patient is taking warfarin and comes in today for a routine follow up visit.  Positive risk factors for bleeding include an age of 75 years or older.  The bleeding index is 'intermediate risk'.  Positive CHADS2 values include History of HTN and Age > 75 years old.  The start date was 05/27/1998.  His last INR was 3.4.  Anticoagulation responsible provider: Shirlee Latch MD, Berley Gambrell.  INR POC: 1.8.  Cuvette Lot#: [id number].  Exp: 02/2011.    Anticoagulation Management Assessment/Plan:      The patient's current anticoagulation dose is Coumadin 5 mg tabs: Use as directed per anticoagulation clinic..  The target INR is 2 - 3.  The next INR is due 04/17/2010.  Anticoagulation instructions were given to patient.  Results were reviewed/authorized by Cloyde Reams, RN, BSN.  He was notified by Cloyde Reams, RN, BSN.         Prior Anticoagulation Instructions: INR 2.5 Continue 1/2 pill everyday except 1 pill on Mondays, Wednesdays and Fridays. Recheck in 4 weeks.   Current Anticoagulation Instructions: INR 1.8  Take 1 tablet today, then resume same dosage 1/2 tablet daily except 1 tablet on Mondays, Wednesdays, and Fridays.  Recheck in 3 weeks.

## 2010-04-04 ENCOUNTER — Telehealth: Payer: Self-pay | Admitting: Internal Medicine

## 2010-04-08 ENCOUNTER — Ambulatory Visit (INDEPENDENT_AMBULATORY_CARE_PROVIDER_SITE_OTHER): Payer: MEDICARE | Admitting: Thoracic Surgery

## 2010-04-08 DIAGNOSIS — C342 Malignant neoplasm of middle lobe, bronchus or lung: Secondary | ICD-10-CM

## 2010-04-09 NOTE — Assessment & Plan Note (Signed)
OFFICE VISIT  Bernard [name], Bernard [name] DOB:  1932/10/04                                        April 08, 2010 CHART #:  [id number]  HISTORY:  The patient comes in today for a 103-month followup.  He is status post a right middle lobectomy in July 2010.  He has been doing well overall.  He saw Dr. Arbutus Ped a week ago and was given a good checkup.  He was asked to follow up again in 6 months with a repeat CT scan.  His breathing has been stable.  He has had a mild upper respiratory infection over the last several weeks, but this is resolving.  He denies any shortness of breath and has had a cough, but this is resolving.  PHYSICAL EXAMINATION:  Vital Signs:  Blood pressure is 116/64, pulse is 74, respirations 14, and O2 sat 93% on room air.  On physical exam, his right thoracotomy scar is healed well.  Heart:  Regular rate and rhythm without murmurs, rubs, or gallops.  Lungs:  Some coarse breath sounds in the left base which clear with cough.  DIAGNOSTIC TESTS:  Chest CT from March 26, 2010, shows expected postoperative changes, right upper lobe calcified granuloma, and no evidence of tumor recurrence.  ASSESSMENT/PLAN:  The patient is doing well overall status post a right middle lobectomy for adenocarcinoma.  He was seen by Dr. [name] today and the films were reviewed.  We will plan on turning his care over to Dr. Arbutus Ped at this time.  We will be happy to see him back if he experiences any problems in the interim.  Coral Ceo, P.A.  GC/MEDQ  D:  04/08/2010  T:  04/09/2010  Job:  [id number]  cc:   Si Gaul, MD

## 2010-04-10 NOTE — Progress Notes (Signed)
Summary: Cough  Phone Note Call from Patient Call back at [facility] Phone [phone] Call back at [phone]   Caller: Wife - Sallye Ober Summary of Call: Pt's wife dropped off a note to MD - Pt has had a cough x 2 days and nights. Just left the cancer center and Dr Gwenyth Bouillon has lungs are clear but wheezing w/the cough. Pt is on coumadin and they are unsure what is ok to take. Pt has hydromet syrup at home - is this ok? Also, is claritin ok to take?  Initial call taken by: Lamar Sprinkles, CMA,  April 02, 2010 3:26 PM  Follow-up for Phone Call        both hydromet and claritin are ok to take Follow-up by: Jacques Navy MD,  April 02, 2010 4:22 PM  Additional Follow-up for Phone Call Additional follow up Details #1::        Pt informed  Additional Follow-up by: Lamar Sprinkles, CMA,  April 02, 2010 5:26 PM

## 2010-04-10 NOTE — Progress Notes (Signed)
Summary: Hydromet  Phone Note Call from Patient   Summary of Call: See previous message - pt would like refill of hydromet syrup, [facility] [address]. Original rx was written for pt's wife. If ok please provide details of rx, thanks Initial call taken by: Lamar Sprinkles, CMA,  April 04, 2010 11:11 AM  Follow-up for Phone Call        ok  1 tsp q 12 - 8oz Follow-up by: Jacques Navy MD,  April 04, 2010 12:59 PM  Additional Follow-up for Phone Call Additional follow up Details #1::        Pt informed  Additional Follow-up by: Lamar Sprinkles, CMA,  April 04, 2010 3:27 PM    New/Updated Medications: HYDROMET 5-1.5 MG/5ML SYRP (HYDROCODONE-HOMATROPINE) 1 tsp every 12 hour as needed cough Prescriptions: HYDROMET 5-1.5 MG/5ML SYRP (HYDROCODONE-HOMATROPINE) 1 tsp every 12 hour as needed cough  #8 x 0   Entered by:   Lamar Sprinkles, CMA   Authorized by:   Jacques Navy MD   Signed by:   Lamar Sprinkles, CMA on 04/04/2010   Method used:   Telephoned to ...       [facility] [address] [phone]* (retail)       [address]       [place], [place]  [address]       Ph: [phone]       Fax: [phone]   RxID:   [id number]

## 2010-04-11 ENCOUNTER — Encounter: Payer: Self-pay | Admitting: Cardiothoracic Surgery

## 2010-04-17 ENCOUNTER — Encounter: Payer: Self-pay | Admitting: Internal Medicine

## 2010-04-17 ENCOUNTER — Encounter (INDEPENDENT_AMBULATORY_CARE_PROVIDER_SITE_OTHER): Payer: MEDICARE

## 2010-04-17 DIAGNOSIS — G459 Transient cerebral ischemic attack, unspecified: Secondary | ICD-10-CM

## 2010-04-17 DIAGNOSIS — Z7901 Long term (current) use of anticoagulants: Secondary | ICD-10-CM

## 2010-04-17 DIAGNOSIS — I4891 Unspecified atrial fibrillation: Secondary | ICD-10-CM

## 2010-04-17 DIAGNOSIS — G450 Vertebro-basilar artery syndrome: Secondary | ICD-10-CM

## 2010-04-17 LAB — CONVERTED CEMR LAB: POC INR: 2.9

## 2010-04-22 NOTE — Medication Information (Signed)
Summary: rov/ewj  Anticoagulant Therapy  Managed by: Cloyde Reams, RN, BSN Referring MD: Illene Regulus PCP: Jacques Navy MD Supervising MD: Ladona Ridgel MD, Sharlot Gowda Indication 1: TIA (Transient Ischemic Attack) (ICD-435.0) Lab Used: [facility] [place] Site: [address] INR POC 2.9 INR RANGE 2 - 3  Dietary changes: no    Health status changes: no    Bleeding/hemorrhagic complications: no    Recent/future hospitalizations: no    Any changes in medication regimen? yes       Details: Incr Tylenol  Recent/future dental: no  Any missed doses?: no       Is patient compliant with meds? yes       Allergies: No Known Drug Allergies  Anticoagulation Management History:      The patient is taking warfarin and comes in today for a routine follow up visit.  Positive risk factors for bleeding include an age of 75 years or older.  The bleeding index is 'intermediate risk'.  Positive CHADS2 values include History of HTN and Age > 53 years old.  The start date was 05/27/1998.  His last INR was 3.4.  Anticoagulation responsible provider: Ladona Ridgel MD, Sharlot Gowda.  INR POC: 2.9.  Exp: 02/2011.    Anticoagulation Management Assessment/Plan:      The patient's current anticoagulation dose is Coumadin 5 mg tabs: Use as directed per anticoagulation clinic..  The target INR is 2 - 3.  The next INR is due 05/15/2010.  Anticoagulation instructions were given to patient.  Results were reviewed/authorized by Cloyde Reams, RN, BSN.  He was notified by Cloyde Reams RN.         Prior Anticoagulation Instructions: INR 1.8  Take 1 tablet today, then resume same dosage 1/2 tablet daily except 1 tablet on Mondays, Wednesdays, and Fridays.  Recheck in 3 weeks.   Current Anticoagulation Instructions: INR 2.9  Continue on same dosage 1/2 tablet daily except 1 tablet on Mondays, Wednesdays, and Fridays.  Recheck in 4 weeks.

## 2010-05-01 NOTE — Letter (Signed)
Summary: Si Gaul MD  Si Gaul MD   Imported By: Lester Freeborn 04/24/2010 08:31:56  _____________________________________________________________________  External Attachment:    Type:   Image     Comment:   External Document

## 2010-05-08 LAB — CBC
HCT: 31.3 % — ABNORMAL LOW (ref 39.0–52.0)
MCHC: 33.2 g/dL (ref 30.0–36.0)
Platelets: 105 10*3/uL — ABNORMAL LOW (ref 150–400)
RDW: 15 % (ref 11.5–15.5)
WBC: 9.5 10*3/uL (ref 4.0–10.5)

## 2010-05-08 LAB — PROTIME-INR: Prothrombin Time: 22.2 seconds — ABNORMAL HIGH (ref 11.6–15.2)

## 2010-05-09 LAB — HEMOGLOBIN A1C
Hgb A1c MFr Bld: 6 % — ABNORMAL HIGH (ref <5.7)
Mean Plasma Glucose: 126 mg/dL — ABNORMAL HIGH (ref <117)

## 2010-05-09 LAB — COMPREHENSIVE METABOLIC PANEL
AST: 23 U/L (ref 0–37)
Albumin: 3.8 g/dL (ref 3.5–5.2)
Alkaline Phosphatase: 36 U/L — ABNORMAL LOW (ref 39–117)
BUN: 17 mg/dL (ref 6–23)
CO2: 28 mEq/L (ref 19–32)
Chloride: 108 mEq/L (ref 96–112)
Potassium: 4.3 mEq/L (ref 3.5–5.1)
Total Bilirubin: 0.8 mg/dL (ref 0.3–1.2)

## 2010-05-09 LAB — URINALYSIS, ROUTINE W REFLEX MICROSCOPIC
Bilirubin Urine: NEGATIVE
Hgb urine dipstick: NEGATIVE
Specific Gravity, Urine: 1.02 (ref 1.005–1.030)
pH: 8 (ref 5.0–8.0)

## 2010-05-09 LAB — GLUCOSE, CAPILLARY
Glucose-Capillary: 137 mg/dL — ABNORMAL HIGH (ref 70–99)
Glucose-Capillary: 137 mg/dL — ABNORMAL HIGH (ref 70–99)

## 2010-05-09 LAB — CBC
Hemoglobin: 10.9 g/dL — ABNORMAL LOW (ref 13.0–17.0)
Hemoglobin: 14.2 g/dL (ref 13.0–17.0)
MCH: 31 pg (ref 26.0–34.0)
MCHC: 33.7 g/dL (ref 30.0–36.0)
MCV: 92.3 fL (ref 78.0–100.0)
MCV: 92.7 fL (ref 78.0–100.0)
Platelets: 105 10*3/uL — ABNORMAL LOW (ref 150–400)
Platelets: 129 10*3/uL — ABNORMAL LOW (ref 150–400)
Platelets: 99 10*3/uL — ABNORMAL LOW (ref 150–400)
RBC: 3.6 MIL/uL — ABNORMAL LOW (ref 4.22–5.81)
RBC: 4.57 MIL/uL (ref 4.22–5.81)
RDW: 14.9 % (ref 11.5–15.5)
RDW: 15.1 % (ref 11.5–15.5)
WBC: 7.2 10*3/uL (ref 4.0–10.5)

## 2010-05-09 LAB — TYPE AND SCREEN: Antibody Screen: NEGATIVE

## 2010-05-09 LAB — BASIC METABOLIC PANEL
BUN: 9 mg/dL (ref 6–23)
CO2: 25 mEq/L (ref 19–32)
Calcium: 8 mg/dL — ABNORMAL LOW (ref 8.4–10.5)
Chloride: 106 mEq/L (ref 96–112)
Creatinine, Ser: 0.95 mg/dL (ref 0.4–1.5)
Creatinine, Ser: 1.02 mg/dL (ref 0.4–1.5)
GFR calc non Af Amer: >60 mL/min (ref >60)
Glucose, Bld: 151 mg/dL — ABNORMAL HIGH (ref 70–99)
Sodium: 137 mEq/L (ref 135–145)

## 2010-05-09 LAB — ABO/RH: ABO/RH(D): A POS

## 2010-05-09 LAB — PROTIME-INR: INR: 1.94 — ABNORMAL HIGH (ref 0.00–1.49)

## 2010-05-09 LAB — SURGICAL PCR SCREEN: MRSA, PCR: NEGATIVE

## 2010-05-15 ENCOUNTER — Ambulatory Visit (INDEPENDENT_AMBULATORY_CARE_PROVIDER_SITE_OTHER): Payer: MEDICARE | Admitting: *Deleted

## 2010-05-15 DIAGNOSIS — G459 Transient cerebral ischemic attack, unspecified: Secondary | ICD-10-CM

## 2010-05-15 DIAGNOSIS — I4891 Unspecified atrial fibrillation: Secondary | ICD-10-CM

## 2010-05-15 LAB — POCT INR: INR: 2.4

## 2010-05-15 NOTE — Patient Instructions (Signed)
INR 2.4  Continue current dosing regimen of 1 tablet (5 mg) on Monday, Wednesday, Friday, and 1/2 tablet (2.5 mg) all other days.  Return to clinic in 4 weeks.

## 2010-06-01 LAB — CBC
HCT: 38.6 % — ABNORMAL LOW (ref 39.0–52.0)
HCT: 42.2 % (ref 39.0–52.0)
Hemoglobin: 12.5 g/dL — ABNORMAL LOW (ref 13.0–17.0)
Hemoglobin: 13.1 g/dL (ref 13.0–17.0)
Hemoglobin: 13.7 g/dL (ref 13.0–17.0)
Hemoglobin: 14.3 g/dL (ref 13.0–17.0)
MCHC: 33.9 g/dL (ref 30.0–36.0)
MCHC: 34 g/dL (ref 30.0–36.0)
MCHC: 34 g/dL (ref 30.0–36.0)
MCHC: 35.7 g/dL (ref 30.0–36.0)
MCV: 92.1 fL (ref 78.0–100.0)
MCV: 92.8 fL (ref 78.0–100.0)
Platelets: 112 10*3/uL — ABNORMAL LOW (ref 150–400)
Platelets: 137 10*3/uL — ABNORMAL LOW (ref 150–400)
RBC: 4.19 MIL/uL — ABNORMAL LOW (ref 4.22–5.81)
RDW: 14.5 % (ref 11.5–15.5)
RDW: 14.7 % (ref 11.5–15.5)
RDW: 14.9 % (ref 11.5–15.5)
RDW: 14.9 % (ref 11.5–15.5)
WBC: 7.8 10*3/uL (ref 4.0–10.5)

## 2010-06-01 LAB — PROTIME-INR
INR: 1.2 (ref 0.00–1.49)
INR: 1.5 (ref 0.00–1.49)
INR: 1.6 — ABNORMAL HIGH (ref 0.00–1.49)
Prothrombin Time: 15.4 seconds — ABNORMAL HIGH (ref 11.6–15.2)
Prothrombin Time: 16.7 seconds — ABNORMAL HIGH (ref 11.6–15.2)
Prothrombin Time: 17.7 seconds — ABNORMAL HIGH (ref 11.6–15.2)
Prothrombin Time: 18.8 seconds — ABNORMAL HIGH (ref 11.6–15.2)
Prothrombin Time: 19.7 seconds — ABNORMAL HIGH (ref 11.6–15.2)
Prothrombin Time: 20.3 seconds — ABNORMAL HIGH (ref 11.6–15.2)

## 2010-06-01 LAB — COMPREHENSIVE METABOLIC PANEL
ALT: 14 U/L (ref 0–53)
Alkaline Phosphatase: 39 U/L (ref 39–117)
BUN: 14 mg/dL (ref 6–23)
CO2: 30 mEq/L (ref 19–32)
Calcium: 8.3 mg/dL — ABNORMAL LOW (ref 8.4–10.5)
Chloride: 105 mEq/L (ref 96–112)
Creatinine, Ser: 0.96 mg/dL (ref 0.4–1.5)
GFR calc non Af Amer: >60 mL/min (ref >60)
Glucose, Bld: 100 mg/dL — ABNORMAL HIGH (ref 70–99)
Glucose, Bld: 119 mg/dL — ABNORMAL HIGH (ref 70–99)
Potassium: 4 mEq/L (ref 3.5–5.1)
Sodium: 140 mEq/L (ref 135–145)
Total Bilirubin: 0.8 mg/dL (ref 0.3–1.2)
Total Protein: 6.5 g/dL (ref 6.0–8.3)

## 2010-06-01 LAB — BASIC METABOLIC PANEL
BUN: 14 mg/dL (ref 6–23)
BUN: 8 mg/dL (ref 6–23)
CO2: 25 mEq/L (ref 19–32)
Calcium: 7.8 mg/dL — ABNORMAL LOW (ref 8.4–10.5)
Creatinine, Ser: 0.77 mg/dL (ref 0.4–1.5)
Creatinine, Ser: 0.83 mg/dL (ref 0.4–1.5)
GFR calc non Af Amer: >60 mL/min (ref >60)
GFR calc non Af Amer: >60 mL/min (ref >60)
Glucose, Bld: 119 mg/dL — ABNORMAL HIGH (ref 70–99)
Glucose, Bld: 125 mg/dL — ABNORMAL HIGH (ref 70–99)
Glucose, Bld: 177 mg/dL — ABNORMAL HIGH (ref 70–99)
Potassium: 3.8 mEq/L (ref 3.5–5.1)
Sodium: 130 mEq/L — ABNORMAL LOW (ref 135–145)
Sodium: 136 mEq/L (ref 135–145)

## 2010-06-01 LAB — CROSSMATCH: Antibody Screen: NEGATIVE

## 2010-06-01 LAB — POCT I-STAT 3, ART BLOOD GAS (G3+)
O2 Saturation: 91 %
Patient temperature: 98.2

## 2010-06-01 LAB — MAGNESIUM: Magnesium: 2.1 mg/dL (ref 1.5–2.5)

## 2010-06-01 LAB — GLUCOSE, CAPILLARY

## 2010-06-02 LAB — URINALYSIS, ROUTINE W REFLEX MICROSCOPIC
Ketones, ur: NEGATIVE mg/dL
Nitrite: NEGATIVE
Specific Gravity, Urine: 1.026 (ref 1.005–1.030)
pH: 6.5 (ref 5.0–8.0)

## 2010-06-02 LAB — BASIC METABOLIC PANEL
BUN: 17 mg/dL (ref 6–23)
Calcium: 9.5 mg/dL (ref 8.4–10.5)
GFR calc Af Amer: >60 mL/min (ref >60)
GFR calc non Af Amer: >60 mL/min (ref >60)
GFR calc non Af Amer: >60 mL/min (ref >60)
Potassium: 4.2 mEq/L (ref 3.5–5.1)
Sodium: 142 mEq/L (ref 135–145)

## 2010-06-02 LAB — DIFFERENTIAL
Lymphocytes Relative: 31 % (ref 12–46)
Lymphs Abs: 2.5 10*3/uL (ref 0.7–4.0)
Monocytes Absolute: 0.7 10*3/uL (ref 0.1–1.0)
Monocytes Relative: 9 % (ref 3–12)
Neutro Abs: 4.8 10*3/uL (ref 1.7–7.7)

## 2010-06-02 LAB — CBC
Hemoglobin: 14.6 g/dL (ref 13.0–17.0)
RBC: 4.68 MIL/uL (ref 4.22–5.81)

## 2010-06-02 LAB — APTT: aPTT: 37 seconds (ref 24–37)

## 2010-06-02 LAB — PROTIME-INR: Prothrombin Time: 16 seconds — ABNORMAL HIGH (ref 11.6–15.2)

## 2010-06-12 ENCOUNTER — Ambulatory Visit (INDEPENDENT_AMBULATORY_CARE_PROVIDER_SITE_OTHER): Payer: MEDICARE | Admitting: *Deleted

## 2010-06-12 DIAGNOSIS — I4891 Unspecified atrial fibrillation: Secondary | ICD-10-CM

## 2010-06-12 DIAGNOSIS — G459 Transient cerebral ischemic attack, unspecified: Secondary | ICD-10-CM

## 2010-06-12 LAB — POCT INR: INR: 2.2

## 2010-07-08 NOTE — Assessment & Plan Note (Signed)
OFFICE VISIT   Bernard [name], Bernard [name]  DOB:  1932-12-14                                        January 02, 2009  CHART #:  [id number]   The patient returns today.  His chest x-ray is looking good.  He is now  4 months since his right middle lobectomy.  He is doing well overall.  He has also had his knee surgery.  I plan to see him back again in  February after his first CT scan.  His blood pressure was 121/77, pulse  64, respirations 18, and sats were 93%.   Ines Bloomer, M.D.  Electronically Signed   DPB/MEDQ  D:  01/02/2009  T:  01/03/2009  Job:  [id number]

## 2010-07-08 NOTE — Op Note (Signed)
Bernard [name], [name]               ACCOUNT NO.:  [id number]   MEDICAL RECORD NO.:  [id number]          PATIENT TYPE:  OUT   LOCATION:  XRAY                         FACILITY:  [facility]   PHYSICIAN:  Almedia Balls. Ranell Patrick, M.D. DATE OF BIRTH:  12-06-1932   DATE OF PROCEDURE:  08/03/2008  DATE OF DISCHARGE:  08/02/2008                               OPERATIVE REPORT   PREOPERATIVE DIAGNOSIS:  Right knee pain secondary to torn medial  meniscus.   POSTOPERATIVE DIAGNOSES:  1. Right knee pain secondary to torn medial meniscus.  2. 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  3. Patellofemoral chondromalacia grade 3 and 4.   PROCEDURE PERFORMED:  Right knee arthroscopy with partial meniscectomy,  abrasion chondroplasty in the patellofemoral compartment and medial  compartment.   ATTENDING SURGEON:  Almedia Balls. Ranell Patrick, M.D.   ASSISTANT:  None.   ANESTHESIA:  Local anesthesia plus heavy MAC was used.   ESTIMATED BLOOD LOSS:  Minimal.   FLUID REPLACEMENT:  1200 mL crystalloid.   INSTRUMENT COUNT:  Correct.   COMPLICATIONS:  None.   Perioperative antibiotics were given.   INDICATIONS:  The patient is a 77 year old male with a history of  worsening right medial knee pain secondary to torn medial meniscus.  The  patient failed conservative management.  At this point consisting  activity modification, injections, antiinflammatories.  We discussed  options including arthroscopy versus arthroplasty due to the patient's  excellent functional status and recently we elected to proceed with  arthroscopy with the hope of eliminating his pain and improving his  function.  Informed consent was obtained.   DESCRIPTION OF THE PROCEDURE:  After an adequate level of anesthesia was  achieved, the patient was positioned in the supine on the operating  table.  Lateral posture was utilized.  Right leg was sterilely prepped  and draped in the usual manner.  Knee was  entered using standard portals  including superolateral outflow, anterolateral scope and anteromedial  working portals.  We identified significant cartilage wear and tear  including loose flaps of cartilage in the patellofemoral joint, mostly  in the trochlear side.  We performed a chondroplasty down to smooth  articular of cartilage using a motorized shaver.  There were some areas  of deep grade 4 wear most of the grade 3 with again fissures and flaps  of cartilage.  The medial femoral condyle was noted to have grade 3 and  grade 4 chondromalacia.  There was an area of the eburnated bone that  married up with a kissing lesion on the tibial side.  The medial  meniscus was torn, this was a complex posterior horn tear with large  paired big flap that was flipped up under the meniscus.  We performed  partial meniscectomy using basket forceps and motorized shaver.  The  remainder of the meniscus was felt to be normal plus 50% of posterior  horn meniscus had to be removed.  Anterior horn was intact.  ACL and PCL  intact.  Marginal osteophytes noted around the notch.  The lateral  compartment entered,  there was no sign of lateral meniscus tear.  There  was little bit of lateral chondromalacia which was treated with  chondroplasty, not to bleeding bone.  Following chondroplasty in the  primarily patellofemoral and medial compartments as well as partial  meniscectomy, we concluded the surgery.  We sutured the wounds with 4-0  Monocryl, followed by Steri-Strips and sterile compressive bandage.  I  did injected Marcaine as well as 1 mL Kenalog 40 into knee at the end of  the surgery prior to placing dressing.      Almedia Balls. Ranell Patrick, M.D.  Electronically Signed     SRN/MEDQ  D:  08/03/2008  T:  08/04/2008  Job:  [id number]

## 2010-07-08 NOTE — Discharge Summary (Signed)
NAMESAINT, Bernard [name]               ACCOUNT NO.:  [id number]   MEDICAL RECORD NO.:  [id number]          PATIENT TYPE:  INP   LOCATION:  3301                         FACILITY:  [facility]   PHYSICIAN:  Ines Bloomer, M.D. DATE OF BIRTH:  1933/01/05   DATE OF ADMISSION:  08/27/2008  DATE OF DISCHARGE:  09/03/2008                               DISCHARGE SUMMARY   ADMITTING DIAGNOSES:  1. Right middle lobe mass.  2. History of hypercholesterolemia.  3. History of hypertension.  4. History of right cerebrovascular accident.  5. History of transient ischemic attacks.  6. History of remote myocardial infarction.  7. History of benign prostatic hypertrophy.   DISCHARGE DIAGNOSES:  1. Right middle lobe mass.  2. History of hypercholesterolemia.  3. History of hypertension.  4. History of right cerebrovascular accident.  5. History of transient ischemic attacks.  6. History of remote myocardial infarction.  7. History of benign prostatic hypertrophy.  8. Postoperative atrial fibrillation (with rapid ventricular rate).  9. Postoperative thrombocytopenia.   PROCEDURE:  Right middle lobectomy with lymph node dissection by Dr.  [name] on August 28, 2008.  Pathology results, right middle lobectomy,  positive for well differentiated adenocarcinoma, margins are negative  for adenocarcinoma, no evidence of carcinoma in the lymph nodes.  The  patient was staged as a pT1 pN0 pMX .   HISTORY OF PRESENTING ILLNESS:  This is a 77 year old Caucasian male  with a past medical history of hypertension, hyperlipidemia, coronary  artery disease, CVA, and TIA, who was first seen by Dr. [name] in the  office on August 07, 2008, for a right middle lobe lesion.  According to  medical records, the patient was scheduled for right knee arthroscopy.  His preoperative workup, a chest x-ray revealed a 1.5-cm nodule in the  right midlung.  A CT of the chest done on July 31, 2008, showed a  spiculated 1.6 cm right middle  lobe mass morphologically concerning for  bronchogenic carcinoma.  The patient then underwent a PET scan on August 02, 2008, which showed a pulmonary nodule in the right middle lobe that  exhibited malignant range, FBG up take (SUV max of 7.8), no evidence of  hypermetabolic metastases were seen.  There was also a focal area of  intense uptake within the prostate gland, as previous stated the patient  was then referred to Dr. [name].  The patient's Coumadin was stopped  several days prior to this admission to [facility] and he underwent the  right VATS, right mini thoracotomy, right middle lobectomy with lymph  node dissection by Dr. [name] on August 28, 2008.   BRIEF HOSPITAL COURSE STAY:  The patient was afebrile and  hemodynamically stable postoperatively.  Suction was decreased and his  heart line was removed on postoperative day #1.  On postop day #2, was  found to have paroxysmal atrial fibrillation with rapid ventricular  rate.  He was initially started on diltiazem drip.  This was  discontinued and he was placed on amiodarone drip followed by p.o.  amiodarone after the patient converted to normal sinus rhythm.  The  patient's posterior chest tube was removed on August 30, 2008.  Followup  chest x-ray revealed no pneumothorax.  Remaining chest tube had no air  leak.  It was removed on August 31, 2008.  Followup chest x-ray again  revealed no pneumothorax.  Foley and PCA were also removed on August 31, 2008.  The patient was then transferred to 33,100 for further  convalescence.  The patient continued to improving currently.  On postop  day #5, the patient was tolerating a diet, has moved bowels, has no  complaints at this time, he is afebrile, heart rates is in the 60s, BP  is 120/73, and O2 sat is 95% on room air.   PHYSICAL EXAMINATION:  CARDIOVASCULAR:  Regular rate and rhythm.  PULMONARY:  Decreased at the right base.  Left lung is clear.  ABDOMEN:  Soft and nontender.  Bowel sounds  present.  EXTREMITIES:  No lower extremity edema.  Right chest wound, slight  erythema of the skin edges, no purulence, CT dressing is dry and intact.  It should be noted that the patient ever been started on his Coumadin as  he was on this preoperatively for previous CVA.  PT and INR monitored  daily.  Provided the patient remains afebrile and hemodynamically  stable.  He will be discharged on September 03, 2008.   LATEST LABORATORY STUDIES:  PT/INR done on July, 11, 2010 was 19.7, 1.6  respectively.  PT/INR will be obtained the morning prior to discharge so  that Coumadin may be dosed accordingly.  Last BMET done on September 01, 2008, potassium 2.7, BUN and creatinine 14, 0.77 respectively.  Last CBC  also done on this date, H&H 11.9, 35.1, white count of 7,100, and  platelet count 137,000.  Last chest x-ray done on September 02, 2008, showed  low lung volumes, stable cardiomegaly, right lower lobe atelectasis and  small pleural effusion, no pneumothorax.  Also, a small left pleural  effusion.   DISCHARGE INSTRUCTIONS:   DIET:  The patient remain on a low-sodium, heart-healthy diet.   ACTIVITY:  The patient may walk up steps.  He is not to lift or drive  for 2 weeks.  Wound care, he may shower, use soap and water on his  wounds.  He may then let them open to air.  He is also to continue his  breathing exercises daily, use to walk every day and increase frequency  and duration as tolerates.   FOLLOWUP APPOINTMENTS:  1. The patient is going to see Dr. [name] in 1 week, office will      contact the patient with a followup appointment date and time and      prior to this office appointment a chest x-ray will obtained 30      minutes prior.  2. The patient is to continue to follow up with Dr. [name]      regarding his prostate.  3. The patient needs to contact the primary care office (Dr. [name])      to have a PT/INR obtained on Wednesday, September 05, 2008.  Chest tube      suture will be  removed at the office appointment with Dr. [name].   DISCHARGE MEDICATIONS:  1. Simvastatin 40 mg nightly.  2. Terazosin 5 mg p.o. daily.  3. Valium 5 mg p.o. daily.  4. Finasteride 5 mg p.o. daily.  5. Celebrex 200 mg p.o. daily.  6. Prilosec 20 mg p.o. daily.  7. Midrin p.o.  p.r.n.  8. Nitroglycerin p.r.n.  9. Amiodarone 200 mg p.o. 2 times daily.  10.Percocet 5/325 one-two tablets every 4-6 hours as needed for pain.  11.Coumadin dose will be determined based on PT/INR at the time of      discharge.   Please see the Dr. [name], Dr. [name], and Dr. [name].      Doree Fudge, [place]      Ines Bloomer, M.D.  Electronically Signed    DZ/MEDQ  D:  09/02/2008  T:  09/03/2008  Job:  [id number]   cc:   Rosalyn Gess. Norins, MD  Courtney Paris, M.D.

## 2010-07-08 NOTE — Op Note (Signed)
NAMETHEOPHIL, THIVIERGE               ACCOUNT NO.:  [id number]   MEDICAL RECORD NO.:  [id number]          PATIENT TYPE:  INP   LOCATION:  2302                         FACILITY:  [facility]   PHYSICIAN:  Ines Bloomer, M.D. DATE OF BIRTH:  29-Nov-1932   DATE OF PROCEDURE:  DATE OF DISCHARGE:                               OPERATIVE REPORT   PREOPERATIVE DIAGNOSIS:  Right middle lobe mass.   POSTOPERATIVE DIAGNOSIS:  Right middle lobe mass.   OPERATION:  Right middle lobectomy.   SURGEON:  Ines Bloomer, MD   ANESTHESIA:  General.   After percutaneous insertion of all monitoring lines, the patient  underwent general anesthesia, was prepped and draped in usual sterile  manner, turned to the left lateral thoracotomy position.  Dual-lumen  tube was inserted.  The left lung was deflated.  Two trocar sites were  made in the anterior axillary line at the seventh intercostal space and  the posterior axillary line at the eighth intercostal space.  Two  trocars were inserted.  The lesion was seen in the lateral segment of  the left upper lobe.  Because of the lesion where it was located,  decided to do a middle lobectomy.  I do not think we could get adequate  margin.  Middle lobectomy dissection was started inferiorly dissecting  out the superior pulmonary vein branches to the middle lobe, they were  actually three of them.  One of them was ligated with 2-0 silk  proximally and distally and clipped and the other two were stapled with  the autosuture 30 gray Roticulator 2 mm stapler.  Then, the inferior  portion of the superior segment was dissected out and divided with the  autosuture.  Green stapler and this exposed the bronchus was resected  free.  Several 10R and 11R nodes were dissected free.  The bronchus was  then divided with an autosuture 3.2 mm Roticulator, 30-mm stapler.  This  was able, then we dissected out several more 10R nodes and exposed two  branches to the middle lobe.  The  smaller over the branches was doubly  ligated with 2-0 silk, clipped, and divided.  The larger branch was  stapled and sutured with 30 Roticulator.  After this had been done,  turned our attention to the minor fissure and that was divided with the  autosuture 4.5-mm 60 stapler with several applications.  We were able to  get a good margin around the lesion.  The right lower lobe was removed.  CoSeal was applied to the staple lines.  Two chest tubes were brought in  through the trocar sites and tied in place with 0 silk.  Chest was  closed with 4 pericostals drilling through the sixth rib and passing  around the fifth rib, #1 Vicryl in the muscle layer, 2-0 Vicryl in  subcutaneous tissue, and Dermabond for the skin.  The patient was  returned to the recovery room in stable condition.      Ines Bloomer, M.D.  Electronically Signed    DPB/MEDQ  D:  08/28/2008  T:  08/29/2008  Job:  [id number]   cc:   Almedia Balls. Ranell Patrick, M.D.

## 2010-07-08 NOTE — Assessment & Plan Note (Signed)
OFFICE VISIT   CHASTON, BRADBURN  DOB:  09-02-1932                                        April 10, 2009  CHART #:  [id number]   The patient today, and his CT scan looks good with no evidence of  recurrence of disease.  We will see him back again in 6 months with  another CT scan.  His blood pressure was 111/71, pulse 66, respirations  18, sats were 97%.  I appreciate the opportunity of seeing the patient.   Ines Bloomer, M.D.  Electronically Signed   DPB/MEDQ  D:  04/10/2009  T:  04/11/2009  Job:  [id number]

## 2010-07-08 NOTE — Letter (Signed)
October 02, 2009   Almedia Balls. Ranell Patrick, MD  [facility]  [address]  [address]  [place], [place] [address]   Re:  WADE, ASEBEDO               DOB:  10-17-1932   Dear Brett Canales,   The patient returns today.  He is now over 1 year since his surgery with  no evidence of recurrence of his cancer.  He is doing well overall from  our standpoint.  His blood pressure was 118/71, pulse 68, respirations  18, and sats were 95%.  I will see him back again in 6 months with  another CT scan.  I appreciate the opportunity of seeing the patient.   Ines Bloomer, M.D.  Electronically Signed   DPB/MEDQ  D:  10/02/2009  T:  10/03/2009  Job:  [id number]   cc:   Lajuana Matte, MD

## 2010-07-08 NOTE — Letter (Signed)
August 07, 2008   Almedia Balls. Ranell Patrick, MD  [facility]  [address]  [place], [place], [place] [address]   Re:  Bernard [name], Bernard [name]               DOB:  [birth year]   Dear Brett Canales:   I appreciate the opportunity of seeing the patient.  This 77 year old  patient was preoperatively to have right knee arthroscopy and was found  to have a right upper lobe lesion.  He is a nonsmoker.  This lesion is  approximately 10 mm in size in the lateral segment of the right lower  lobe.  He underwent a PET scan which showed an uptake of 2.6 in this  area.  His mediastinal adenopathy was negative.  There was an area of  focal uptake in the prostate which was 7.8 standard uptake value.  He  has been followed for his prostate by Dr. [name].  He has had no  weight loss, fever, chills, or excessive sputum.  No hemoptysis.  His  medications include Coumadin 5 mg a day, simvastatin 20 mg a day,  terazosin 5 mg daily, diazepam 5 mg daily, finasteride 5 mg daily,  Celebrex 200 mg p.r.n., Prilosec 20 mg p.r.n., Midrin p.r.n., and  nitroglycerin.  His past medical history is significant that he has  hypertension and hypercholesterolemia.  He had a right CVA in 1994 and a  questionable TIA in 1996 and a previous MI in 1976.   FAMILY HISTORY:  Noncontributory.   SOCIAL HISTORY:  He is married.  He has 1 child.  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  Does not smoke, does not drink alcohol on  a regular basis.   REVIEW OF SYSTEMS:  He is 5 feet 9 inches.  He is 210 pounds.  He has  had some recent weight gain.  Cardiac:  No angina or atrial  fibrillation.  Pulmonary:  No hemoptysis or wheezing.  GI:  He had  reflux.  GU:  Frequent urination.  No kidney disease.  Vascular:  He has  had a previous stroke and TIAs.  Had some residual slurred speech.  No  DVT or claudication.  Neurological:  He has had headaches.  Musculoskeletal:  No arthritis, joint pain.  Psychiatric:  He has been  treated for  nervousness.  Eyes/ENT:  No changes in his eyesight or  hearing.  Hematological:  No clotting or bleeding disorders.  No anemia,  but he is on Coumadin.   PHYSICAL EXAMINATION:  GENERAL:  He is a well-developed, Caucasian male  in no acute distress.  HEAD, EYES, EARS, NOSE, AND THROAT:  Unremarkable.  NECK:  Supple without thyromegaly.  There is no supraclavicular or  axillary adenopathy.  CHEST:  Clear to auscultation and percussion.  HEART:  Regular sinus rhythm.  No murmurs.  ABDOMEN:  Soft.  No hepatosplenomegaly.  EXTREMITIES:  Pulses are 2+.  There is no clubbing or edema.  NEUROLOGICAL:  He is oriented x3.  Sensory and motor intact.   He is wearing TED hose.  You can see his arthroscopy incisions.  He is  ambulating reasonably well since his arthroscopy.  I feel that the  patient has a malignant tumor.  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  I do recommend that he has excision of this, and  I think we  will do this via the VATS approach.  We will tentatively set  this up to do on August 28, 2008, and he will be admitted to the hospital  on August 27, 2008, and stop his Coumadin on August 22, 2008.  I will also  send a letter to Dr. [name], so he can evaluate his prostate.  Apparently, he has been followed serially by Dr. [name] for his  prostate.  I appreciate the opportunity of seeing the patient.   Sincerely,   Ines Bloomer, M.D.  Electronically Signed   DPB/MEDQ  D:  08/07/2008  T:  08/08/2008  Job:  [id number]   cc:   Rosalyn Gess. Norins, MD  Courtney Paris, M.D.

## 2010-07-08 NOTE — Letter (Signed)
October 31, 2008   Almedia Balls. Ranell Patrick, M.D.  [facility]  [address]  [place], [place] [address]   Re:  [name], Bernard [name]               DOB:  1932-08-26   Dear Viviann Spare,   I saw the patient back in the office today.  His chest x-ray shows  further improvements in his right lung where there was resolution of his  right pleural effusion.  His blood pressure was 150/74, pulse 60,  respirations 16, sats were 97%.  From my standpoint, he can start  physical therapy regarding his knee since he is having some swelling in  his knee.  Dr. Shirline Frees is going to do a CT scan on him in February and  I will see him back again in 2 months with a chest x-ray.  His incisions  were well healed.  His lungs are clear to auscultation and percussion.  He is having some dizziness, and I have told him to check with Dr.  [name] on this.   Sincerely,   Ines Bloomer, M.D.  Electronically Signed   DPB/MEDQ  D:  10/31/2008  T:  10/31/2008  Job:  [id number]   cc:   Lajuana Matte, MD  Rosalyn Gess. Norins, MD

## 2010-07-08 NOTE — H&P (Signed)
Bernard [name], [name]               ACCOUNT NO.:  [id number]   MEDICAL RECORD NO.:  [id number]          PATIENT TYPE:  AMB   LOCATION:  SDS                          FACILITY:  [facility]   PHYSICIAN:  Ines Bloomer, M.D. DATE OF BIRTH:  07-07-1932   DATE OF ADMISSION:  08/02/2008  DATE OF DISCHARGE:  08/02/2008                              HISTORY & PHYSICAL   This 77 year old apparently had a right knee arthroscopy and was found  to have a right upper lobe lesion.  He is a nonsmoker.  The lesion is 10  mm in size in the lateral segment of the right middle lobe.  It was  initially thought to be in the right upper lobe.  He also had uptake in  his prostate, which is being followed by Dr. Aldean [name].  He has had no  weight loss, fever, chills, or excessive sputum.  No hemoptysis.  He is  admitted for resection of his right middle lobe lesion.   His medications include:  1. Coumadin 5 mg a day.  2. Simvastatin.  3. Terazosin 5 mg daily.  4. Diazepam 5 mg day.  5. Finasteride 5 mg daily.  6. Celebrex 200 mg daily.  7. Prilosec 20 mg daily.  8. Midrin.  9. Nitroglycerin.   He has hypercholesterolemia, hypertension.  He had a right CVA in 1994,  TIA in 1996, and had an MI in 1976.   FAMILY HISTORY:  Noncontributory.   SOCIAL HISTORY:  He is married, has 1 child.  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  He does not smoke, does not drink alcohol on a regular  basis.   REVIEW OF SYSTEMS:  GENERAL:  He is 5 feet 9 inches, 210 pounds.  He has  had some recent weight gain.  CARDIAC:  No angina or atrial  fibrillation.  PULMONARY:  No hemoptysis or wheezing.  GI:  He has got  reflux.  GU:  Frequent urination.  No kidney disease.  VASCULAR:  He has  had the previous stroke and TIAs, some residual disability  after each.  No DVT or TIAs.  NEUROLOGIC:  He has headaches.  MUSCULOSKELETAL:  He  has arthritis and joint pain.  PSYCHIATRIC:  He has been treated for  nervousness.  HEENT:  No  changes in eyesight or hearing.  HEMATOLOGIC:  No problems with bleeding and clotting disorders.   PHYSICAL EXAMINATION:  GENERAL:  He is a well-developed Caucasian male  in no acute distress.  VITAL SIGNS:  His blood pressure is 120/75, pulse 63, respirations 18,  sats were 95%.  HEAD, EYES, EARS, NOSE, AND THROAT:  Unremarkable.  NECK:  Supple without thyromegaly.  There is no supraclavicular or  axillary adenopathy.  CHEST:  Clear to auscultation and percussion.  HEART:  Regular sinus rhythm.  No murmurs.  ABDOMEN:  Soft.  There is no hepatosplenomegaly.  EXTREMITIES:  Pulses are 2+.  There is no clubbing or edema.  He has got  a right knee incision.  NEUROLOGIC:  He is oriented x3.  His sensory and motor are intact.  This patient unfortunately has evidence of a new right middle lobe  lesion that could possibly be a carcinoid tumor given his history of  nonsmoking or early adenocarcinoma.  We planned to do a right VATS,  right middle lobectomy on him.   IMPRESSION:  1. Right middle lobe lesion.  2. Hypertension.  3. Hypercholesterolemia.  4. History of transient ischemic attacks.  5. History of coronary artery disease.  6. Gastroesophageal reflux disease.      Ines Bloomer, M.D.  Electronically Signed     DPB/MEDQ  D:  08/23/2008  T:  08/24/2008  Job:  [id number]

## 2010-07-08 NOTE — Letter (Signed)
September 12, 2008   Almedia Balls. Ranell Patrick, MD  [facility]  [address]  [address]  [place], [place] [address]   Re:  ROD, MAJERUS               DOB:  05-28-1932   Dear Dr. Ranell Patrick:   The patient came for followup today.  His blood pressure was 112/71,  pulse 62, respirations 18, and saturations were 94%.  His lungs were  clear to auscultation and percussion.  We removed his chest tube  sutures.  Incision is healing well.  He does have looks like a rash  probably secondary to the tape and we started him on some Kenalog 0.1%  to apply twice a day.  I will see him back again in 2 weeks with another  chest x-ray.  His chest x-ray did show still some atelectasis in the  right lower lobe and postoperative changes, but was improving.  I will  also refer him to Dr. Si Gaul for evaluation of a stage IA non-  small cell lung cancer.  I appreciate the opportunity of seeing the  patient.   Sincerely,   Ines Bloomer, M.D.  Electronically Signed   DPB/MEDQ  D:  09/12/2008  T:  09/13/2008  Job:  [id number]   cc:   Lajuana Matte, MD

## 2010-07-08 NOTE — Assessment & Plan Note (Signed)
OFFICE VISIT   Bernard [name], Bernard [name]  DOB:  04-12-1932                                        September 26, 2008  CHART #:  [id number]   The patient returns today.  His blood pressure was 125/76, pulse 64,  respirations 18, and sats were 93%.  He has quit taking pain medication.  He still has a chronic cough and we gave him some Tussionex for this.  His lungs are clear to auscultation and percussion.  He will see Dr.  Arbutus Ped on Monday.  Overall, he is doing well.  His chest x-ray showed  improving aeration.  I will see him back again in 6 weeks with a chest x-  ray.   Ines Bloomer, M.D.  Electronically Signed   DPB/MEDQ  D:  09/26/2008  T:  09/26/2008  Job:  [id number]

## 2010-07-10 ENCOUNTER — Ambulatory Visit (INDEPENDENT_AMBULATORY_CARE_PROVIDER_SITE_OTHER): Payer: MEDICARE | Admitting: *Deleted

## 2010-07-10 DIAGNOSIS — G459 Transient cerebral ischemic attack, unspecified: Secondary | ICD-10-CM

## 2010-07-10 DIAGNOSIS — I4891 Unspecified atrial fibrillation: Secondary | ICD-10-CM

## 2010-07-11 NOTE — Assessment & Plan Note (Signed)
[facility]                           PRIMARY CARE OFFICE NOTE   NAME:Bernard, [name]                      MRN:          [id number]  DATE:04/22/2006                            DOB:          1932/12/17    Bernard [name] is a 77 year old gentleman, well known to me, followed for  delayed sequelae of CVA, hyperlipidemia, hypertension, known coronary  artery disease.  The patient was last seen in the office for a contusion  of his finger October 05, 2005, and last physical exam was April 14, 2005.  The patient reports in the interval he has been doing well.  He  is followed by Dr. Vic Blackbird, and did bring with him a report of  a PSA that was 2.94 at last examination by Dr. [name], which is  stable.   Past medical history is well documented in my chart note of April 14, 2005, as it family history and social history, with no significant  changes.   CURRENT MEDICATIONS:  1. Coumadin as directed.  2. Zocor 20 mg daily.  3. Hytrin 5 mg daily.  4. Prilosec OTC every other day.  5. Proscar 5 mg daily.  6. Valium 5 mg p.r.n.  7. Nitrostat as needed.  8. Celebrex 200 mg as needed.   REVIEW OF SYSTEMS:  Negative for any constitutional, cardiovascular,  respiratory, GU or GU complaints.   PHYSICAL EXAMINATION:  Temperature was 96.9, blood pressure 110/82,  pulse 44, weight 210.  GENERAL APPEARANCE:  Well-developed, well-nourished Caucasian gentleman  who looks younger than his stated chronologic age, who is in no acute  distress.  HEENT:  Normocephalic, atraumatic.  EACs and TMS were unremarkable.  Oropharynx had no lesions.  The posterior pharynx was clear,  conjunctivae and sclerae were clear, pupils equal, round and reactive to  light and accommodation.  Funduscopic exam was unremarkable.  NECK:  Supple without thyromegaly.  NODES:  No adenopathy was noted in the cervical or supraclavicular  regions.  CHEST:  No CVA tenderness.  LUNGS:  Clear to auscultation and percussion.  CARDIOVASCULAR:  2+ radial pulses, no JVD, no carotid bruits.  He had a  quiet precordium with a regular rate and rhythm, without murmurs, rubs  or gallops.  ABDOMEN:  Soft, no guarding, no rebound, no organosplenomegaly was  appreciated.  GENITALIA AND RECTAL EXAM:  Prostate exam deferred to Dr. [name].  EXTREMITIES:  Without clubbing, cyanosis, edema or deformity.  NEUROLOGIC EXAM:  Notable to have cranial nerves II-XII grossly intact.  Motor strength was normal, deep tendon reflexes were normal.  Cerebellar  function was unremarkable, motor strength was normal.  Cognitive  function was not tested, although the patient previously has been shown  to have some mild cognitive slowing.   DATABASE:  Cholesterol was 147, triglycerides were 76, HDL was 41.4, LDL  was 90.  Chemistries were unremarkable with a glucose of 109, potassium  was normal at 4.1, kidney function normal with a creatinine of 1.1, a  GFR of 70 mL/minute.  Liver functions were normal.   ASSESSMENT AND PLAN:  1. Neurologic:  The patient is stable with some mild cognitive      impairment but otherwise very functional in regards to his CVA.  2. Hypertension:  The patient's blood pressure is very well controlled      at this time.  3. Lipids:  The patient with good control, with his LDL definitely at      goal.  4. Genitourinary:  The patient is followed by Dr. [name] on a      regular basis and seems to be stable and current and up to date      with a normal PSA.  5. Gastrointestinal:  The patient's last upper endoscopy was May 14, 2004, with esophageal stricture, but he has had no recurrent      problems.  The last colonoscopy was a flexible sigmoidoscopy in      1999.   The patient was given refill prescriptions as needed.   In summary, this is a very pleasant gentleman who seems medically stable  at this time.  He will return to see me on a p.r.n.  basis.     Bernard Gess Norins, MD  Electronically Signed    MEN/MedQ  DD: 04/22/2006  DT: 04/22/2006  Job #: [id number]   cc:   Bernard [name]

## 2010-07-30 ENCOUNTER — Encounter: Payer: Self-pay | Admitting: Internal Medicine

## 2010-08-07 ENCOUNTER — Ambulatory Visit (INDEPENDENT_AMBULATORY_CARE_PROVIDER_SITE_OTHER): Payer: MEDICARE | Admitting: *Deleted

## 2010-08-07 DIAGNOSIS — I4891 Unspecified atrial fibrillation: Secondary | ICD-10-CM

## 2010-08-07 DIAGNOSIS — G459 Transient cerebral ischemic attack, unspecified: Secondary | ICD-10-CM

## 2010-09-03 ENCOUNTER — Ambulatory Visit (INDEPENDENT_AMBULATORY_CARE_PROVIDER_SITE_OTHER): Payer: MEDICARE | Admitting: Internal Medicine

## 2010-09-03 ENCOUNTER — Ambulatory Visit (INDEPENDENT_AMBULATORY_CARE_PROVIDER_SITE_OTHER): Payer: MEDICARE | Admitting: *Deleted

## 2010-09-03 ENCOUNTER — Encounter: Payer: Self-pay | Admitting: Internal Medicine

## 2010-09-03 VITALS — BP 104/68 | HR 57 | Resp 18 | Ht 70.0 in | Wt 203.4 lb

## 2010-09-03 DIAGNOSIS — I10 Essential (primary) hypertension: Secondary | ICD-10-CM

## 2010-09-03 DIAGNOSIS — E785 Hyperlipidemia, unspecified: Secondary | ICD-10-CM

## 2010-09-03 DIAGNOSIS — I2510 Atherosclerotic heart disease of native coronary artery without angina pectoris: Secondary | ICD-10-CM

## 2010-09-03 DIAGNOSIS — I4891 Unspecified atrial fibrillation: Secondary | ICD-10-CM

## 2010-09-03 DIAGNOSIS — G459 Transient cerebral ischemic attack, unspecified: Secondary | ICD-10-CM

## 2010-09-03 NOTE — Assessment & Plan Note (Signed)
Stable No change required today  

## 2010-09-03 NOTE — Progress Notes (Signed)
The patient presents today for routine electrophysiology followup.  Since last being seen in our clinic, the patient reports doing very well.  He is unaware of any further afib.  He reports occasional postural dizziness.  Today, he denies symptoms of palpitations, chest pain, shortness of breath, orthopnea, PND, lower extremity edema, presyncope, syncope, or neurologic sequela.  The patient feels that he is tolerating medications without difficulties and is otherwise without complaint today.   Past Medical History  Diagnosis Date  . Cancer     lung ca dx'd 2010  . History of BPH     benign prostatic hypertrophy  . Malignant neoplasm middle lobe, bronchus or lung     right lung. Dx 07/27/08, sp/p resection 7/10.   . Paroxysmal atrial fibrillation     postoperative after lung surgery  . Osteoarthrosis, unspecified whether generalized or localized, lower leg     knee  . Labyrinthitis, unspecified   . Esophageal reflux   . Cerebrovascular disease     s/p prior stroke  . Unspecified essential hypertension   . Other and unspecified hyperlipidemia   . Depressive disorder, not elsewhere classified   . CAD (coronary artery disease)     s/p MI 87s, normal myoview 7/11   Past Surgical History  Procedure Date  . Esophagogastroduodenoscopy 05/14/04  . Bunionectomy '89    left foot  . Inguinal hernia repair '71    left  . Anthroscopic surgery '10    right knee \  . Vat-rml lobectomy   . Right tkr 8/11    Current Outpatient Prescriptions  Medication Sig Dispense Refill  . APAP-Isometheptene-Dichloral 325-65-100 MG per capsule Take 1 capsule by mouth 4 (four) times daily as needed.        . Calcium Carbonate (CALTRATE 600) 1500 MG TABS Take 1 tablet by mouth daily.        . diazepam (VALIUM) 5 MG tablet Take 5 mg by mouth 2 (two) times daily.        . finasteride (PROSCAR) 5 MG tablet Take 5 mg by mouth daily.        . meclizine (ANTIVERT) 25 MG tablet Take 25 mg by mouth every 6 (six)  hours as needed. 1/2 or 1 tablet prn for dizziness       . nitroGLYCERIN (NITROSTAT) 0.4 MG SL tablet Place 0.4 mg under the tongue every 5 (five) minutes as needed.        [name] omeprazole (PRILOSEC) 20 MG capsule Take 20 mg by mouth. Every other day        . oxyCODONE-acetaminophen (PERCOCET) 5-325 MG per tablet Take 1 tablet by mouth every 4 (four) hours as needed. 1 or 2 tablets       . simvastatin (ZOCOR) 20 MG tablet Take 20 mg by mouth daily.        [name] terazosin (HYTRIN) 5 MG capsule Take 5 mg by mouth daily.        [name] warfarin (COUMADIN) 5 MG tablet Take by mouth as directed.          No Known Allergies  History   Social History  . Marital Status: Married    Spouse Name: N/A    Number of Children: N/A  . Years of Education: N/A   Occupational History  . Not on file.   Social History Main Topics  . Smoking status: Never Smoker   . Smokeless tobacco: Not on file  . Alcohol Use: No  . Drug Use: No  .  Sexually Active: Not on file   Other Topics Concern  . Not on file   Social History Narrative   Married '70. 1 son '75. Lives with wife independently in [place]. 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 [employer]. 09/10/09.     Family History  Problem Relation Age of Onset  . Diabetes insipidus      2nd kin   . Coronary artery disease Neg Hx   . Colon cancer Neg Hx   . Prostate cancer Neg Hx    Physical Exam: Filed Vitals:   09/03/10 1050  BP: 104/68  Pulse: 57  Resp: 18  Height: 5\' 10"  (1.778 m)  Weight: 203 lb 6.4 oz (92.262 kg)    GEN- The patient is well appearing, alert and oriented x 3 today.   Head- normocephalic, atraumatic Eyes-  Sclera clear, conjunctiva pink Ears- hearing intact Oropharynx- clear Neck- supple, no JVP Lymph- no cervical lymphadenopathy Lungs- Clear to ausculation bilaterally, normal work of breathing Heart- Regular rate and rhythm, no murmurs, rubs or gallops, PMI  not laterally displaced GI- soft, NT, ND, + BS Extremities- no clubbing, cyanosis, or edema MS- no significant deformity or atrophy Skin- no rash or lesion Psych- euthymic mood, full affect Neuro- strength and sensation are intact  ekg today reveals sinus bradycardia 56 bpm, otherwise normal ekg  Assessment and Plan:

## 2010-09-03 NOTE — Assessment & Plan Note (Signed)
Lipids from 1/12 reviewed Stable No change required today

## 2010-09-03 NOTE — Assessment & Plan Note (Signed)
No ischemic symptoms Normal preop myoview 7/11 No changes today.

## 2010-09-03 NOTE — Assessment & Plan Note (Signed)
afib was post op following lung surgery No recurrence off of AAD  Continue longterm coumadin given prior CVA.

## 2010-09-24 ENCOUNTER — Other Ambulatory Visit: Payer: Self-pay | Admitting: Internal Medicine

## 2010-09-24 ENCOUNTER — Encounter (HOSPITAL_BASED_OUTPATIENT_CLINIC_OR_DEPARTMENT_OTHER): Payer: MEDICARE | Admitting: Internal Medicine

## 2010-09-24 ENCOUNTER — Encounter (HOSPITAL_COMMUNITY): Payer: Self-pay

## 2010-09-24 ENCOUNTER — Ambulatory Visit (HOSPITAL_COMMUNITY)
Admission: RE | Admit: 2010-09-24 | Discharge: 2010-09-24 | Disposition: A | Discharge status: Home / Self Care | Payer: MEDICARE | Source: Ambulatory Visit | Attending: Internal Medicine | Admitting: Internal Medicine

## 2010-09-24 DIAGNOSIS — C3490 Malignant neoplasm of unspecified part of unspecified bronchus or lung: Secondary | ICD-10-CM | POA: Insufficient documentation

## 2010-09-24 DIAGNOSIS — Z902 Acquired absence of lung [part of]: Secondary | ICD-10-CM | POA: Insufficient documentation

## 2010-09-24 DIAGNOSIS — C342 Malignant neoplasm of middle lobe, bronchus or lung: Secondary | ICD-10-CM

## 2010-09-24 DIAGNOSIS — K449 Diaphragmatic hernia without obstruction or gangrene: Secondary | ICD-10-CM | POA: Insufficient documentation

## 2010-09-24 DIAGNOSIS — N289 Disorder of kidney and ureter, unspecified: Secondary | ICD-10-CM | POA: Insufficient documentation

## 2010-09-24 LAB — CMP (CANCER CENTER ONLY)
BUN, Bld: 14 mg/dL (ref 7–22)
CO2: 30 mEq/L (ref 18–33)
Creat: 1.2 mg/dl (ref 0.6–1.2)
Glucose, Bld: 95 mg/dL (ref 73–118)
Total Bilirubin: 0.8 mg/dl (ref 0.20–1.60)

## 2010-09-24 LAB — CBC WITH DIFFERENTIAL/PLATELET
Basophils Absolute: 0 10*3/uL (ref 0.0–0.1)
Eosinophils Absolute: 0.1 10*3/uL (ref 0.0–0.5)
HCT: 40.8 % (ref 38.4–49.9)
HGB: 13.8 g/dL (ref 13.0–17.1)
LYMPH%: 34.5 % (ref 14.0–49.0)
MCHC: 33.9 g/dL (ref 32.0–36.0)
MONO#: 0.5 10*3/uL (ref 0.1–0.9)
NEUT#: 3.7 10*3/uL (ref 1.5–6.5)
NEUT%: 56.5 % (ref 39.0–75.0)
Platelets: 125 10*3/uL — ABNORMAL LOW (ref 140–400)
WBC: 6.6 10*3/uL (ref 4.0–10.3)

## 2010-09-24 MED ORDER — IOHEXOL 300 MG/ML  SOLN
80.0000 mL | Freq: Once | INTRAMUSCULAR | Status: AC | PRN
  Administered 2010-09-24: 80 mL via INTRAVENOUS

## 2010-10-01 ENCOUNTER — Encounter (HOSPITAL_BASED_OUTPATIENT_CLINIC_OR_DEPARTMENT_OTHER): Payer: MEDICARE | Admitting: Internal Medicine

## 2010-10-01 ENCOUNTER — Other Ambulatory Visit: Payer: Self-pay | Admitting: Internal Medicine

## 2010-10-01 DIAGNOSIS — C3490 Malignant neoplasm of unspecified part of unspecified bronchus or lung: Secondary | ICD-10-CM

## 2010-10-01 DIAGNOSIS — R51 Headache: Secondary | ICD-10-CM

## 2010-10-01 DIAGNOSIS — C342 Malignant neoplasm of middle lobe, bronchus or lung: Secondary | ICD-10-CM

## 2010-10-02 ENCOUNTER — Ambulatory Visit (INDEPENDENT_AMBULATORY_CARE_PROVIDER_SITE_OTHER): Payer: MEDICARE | Admitting: *Deleted

## 2010-10-02 DIAGNOSIS — G459 Transient cerebral ischemic attack, unspecified: Secondary | ICD-10-CM

## 2010-10-02 DIAGNOSIS — I4891 Unspecified atrial fibrillation: Secondary | ICD-10-CM

## 2010-10-02 LAB — POCT INR: INR: 2.2

## 2010-10-03 ENCOUNTER — Other Ambulatory Visit (HOSPITAL_COMMUNITY): Payer: MEDICARE

## 2010-10-30 ENCOUNTER — Ambulatory Visit (INDEPENDENT_AMBULATORY_CARE_PROVIDER_SITE_OTHER): Payer: MEDICARE | Admitting: *Deleted

## 2010-10-30 DIAGNOSIS — I4891 Unspecified atrial fibrillation: Secondary | ICD-10-CM

## 2010-10-30 DIAGNOSIS — G459 Transient cerebral ischemic attack, unspecified: Secondary | ICD-10-CM

## 2010-11-27 ENCOUNTER — Ambulatory Visit (INDEPENDENT_AMBULATORY_CARE_PROVIDER_SITE_OTHER): Payer: MEDICARE | Admitting: *Deleted

## 2010-11-27 DIAGNOSIS — G459 Transient cerebral ischemic attack, unspecified: Secondary | ICD-10-CM

## 2010-11-27 DIAGNOSIS — I4891 Unspecified atrial fibrillation: Secondary | ICD-10-CM

## 2010-11-27 LAB — POCT INR: INR: 2.4

## 2010-12-09 ENCOUNTER — Ambulatory Visit: Payer: MEDICARE

## 2010-12-25 ENCOUNTER — Ambulatory Visit (INDEPENDENT_AMBULATORY_CARE_PROVIDER_SITE_OTHER): Payer: MEDICARE

## 2010-12-25 ENCOUNTER — Ambulatory Visit (INDEPENDENT_AMBULATORY_CARE_PROVIDER_SITE_OTHER): Payer: MEDICARE | Admitting: *Deleted

## 2010-12-25 DIAGNOSIS — Z23 Encounter for immunization: Secondary | ICD-10-CM

## 2010-12-25 DIAGNOSIS — G459 Transient cerebral ischemic attack, unspecified: Secondary | ICD-10-CM

## 2010-12-25 DIAGNOSIS — I4891 Unspecified atrial fibrillation: Secondary | ICD-10-CM

## 2010-12-25 LAB — POCT INR: INR: 2.1

## 2011-01-09 ENCOUNTER — Telehealth: Payer: Self-pay

## 2011-01-09 ENCOUNTER — Telehealth: Payer: Self-pay | Admitting: Pharmacist

## 2011-01-09 NOTE — Telephone Encounter (Signed)
Given prior CVA, would bridge with lovenox for the procedure.

## 2011-01-09 NOTE — Telephone Encounter (Signed)
Pt wife called.  Pt needs to have prostate biopsy.  No date set yet.  Pt needs to be cleared to hold Coumadin.  He has a history of afib and TIAs.  Will send to Dr. Johney Frame for review.

## 2011-01-09 NOTE — Telephone Encounter (Signed)
Wife called, pt needs to have prostate biopsy, due to findings of a nodule. Per wife, a letter of clearance need to be faxed to Dr Margarita Grizzle office  [phone] (fax)// [phone] phone). Patient aware MD is out of office

## 2011-01-12 NOTE — Telephone Encounter (Signed)
Spoke with pt's wife.  She is aware patient will need Lovenox bridge.  No date set yet.  Will call us when date available.

## 2011-01-18 NOTE — Telephone Encounter (Signed)
Letter done

## 2011-01-21 ENCOUNTER — Ambulatory Visit (INDEPENDENT_AMBULATORY_CARE_PROVIDER_SITE_OTHER): Payer: MEDICARE | Admitting: Internal Medicine

## 2011-01-21 VITALS — BP 118/62 | HR 58 | Temp 97.6°F | Wt 207.0 lb

## 2011-01-21 DIAGNOSIS — Z85118 Personal history of other malignant neoplasm of bronchus and lung: Secondary | ICD-10-CM

## 2011-01-21 DIAGNOSIS — I2510 Atherosclerotic heart disease of native coronary artery without angina pectoris: Secondary | ICD-10-CM

## 2011-01-21 DIAGNOSIS — N400 Enlarged prostate without lower urinary tract symptoms: Secondary | ICD-10-CM

## 2011-01-21 DIAGNOSIS — I10 Essential (primary) hypertension: Secondary | ICD-10-CM

## 2011-01-21 DIAGNOSIS — C342 Malignant neoplasm of middle lobe, bronchus or lung: Secondary | ICD-10-CM

## 2011-01-21 MED ORDER — WARFARIN SODIUM 5 MG PO TABS: 5.0000 mg | ORAL_TABLET | ORAL | Status: DC

## 2011-01-21 MED ORDER — NITROGLYCERIN 0.4 MG SL SUBL: 0.4000 mg | SUBLINGUAL_TABLET | SUBLINGUAL | Status: DC | PRN

## 2011-01-21 MED ORDER — MECLIZINE HCL 25 MG PO TABS: 25.0000 mg | ORAL_TABLET | Freq: Four times a day (QID) | ORAL | Status: DC | PRN

## 2011-01-21 MED ORDER — TERAZOSIN HCL 5 MG PO CAPS: 5.0000 mg | ORAL_CAPSULE | Freq: Every day | ORAL | Status: DC

## 2011-01-21 MED ORDER — OXYCODONE-ACETAMINOPHEN 5-325 MG PO TABS: 1.0000 | ORAL_TABLET | ORAL | Status: DC | PRN

## 2011-01-21 MED ORDER — FINASTERIDE 5 MG PO TABS: 5.0000 mg | ORAL_TABLET | Freq: Every day | ORAL | Status: DC

## 2011-01-21 MED ORDER — SIMVASTATIN 20 MG PO TABS: 20.0000 mg | ORAL_TABLET | Freq: Every day | ORAL | Status: DC

## 2011-01-21 MED ORDER — OMEPRAZOLE 20 MG PO CPDR: 20.0000 mg | DELAYED_RELEASE_CAPSULE | Freq: Every day | ORAL | Status: DC

## 2011-01-21 NOTE — Progress Notes (Signed)
  Subjective:    Patient ID: Bernard [name], male    DOB: 1932-05-22, 77 y.o.   MRN: [id number]  HPI Mr. Kolenovic has a h/o lung cancer s/p lobectomy. He had a PET scan in June '10 which in addition to increased activity in the lung revealed increased activity in the prostate. He did see Dr. [name] who felt there was no problem. He did have a PSA 2.0 Feb '11. He had a repeat PSA recently at 1.8. For both tests he was on proscar. Dr. Margarita Grizzle did palpate a nodule in the prostate and has recommended a biopsy which will require bridging with lovenox.   Mr. Hamman also c/o an odd feeling in his head - frontal sinus area. He is concerned for possible brain mets or other illness involving the brain.   Patient reports that he has felt something different in the right breast.   I have reviewed the patient's medical history in detail and updated the computerized patient record.   Review of Systems System review is negative for any constitutional, cardiac, pulmonary, GI or neuro symptoms or complaints other than as described in the HPI.     Objective:   Physical Exam Vitals reviewed. Gen'l - WNWD white man in no distress HEENT - [place]/AT, C&S clear, PERRLA, Skull is normal, no tenderness over the facial sinus Pulm - normal respirations, Lungs - CTAP Cor- 2+ radial pulse, RRR Abd - soft       Assessment & Plan:  He is medically cleared for Prostate Bx

## 2011-01-22 ENCOUNTER — Telehealth: Payer: Self-pay | Admitting: *Deleted

## 2011-01-22 MED ORDER — ENOXAPARIN SODIUM 150 MG/ML ~~LOC~~ SOLN: 140.0000 mg | Freq: Every day | SUBCUTANEOUS | Status: DC

## 2011-01-22 NOTE — Telephone Encounter (Signed)
Patients wife called, patient is scheduled for prostate biopsy 01/30/2011 at 2:15p from Dr Margarita Grizzle office.   They have been instructed,  by Weston Brass,  to hold coumadin 5 days prior to procedure and patient will need to be bridged with lovenox.

## 2011-01-22 NOTE — Telephone Encounter (Signed)
Discussed bridging patient for upcoming procedure on 01/30/11 with staff--- Bethena Midget, RN and Lanora Manis the pharmacist. Creatinine clearance on Jan 2012---0.9  Creatinine clearance on Aug 2012---1.2 Weight 207 lb Patient has administered lovenox injections himself with a prior knee surgery  Instructions given to Ms Plush over phone: Last dose of po coumadin 01/24/2011 No coumadin or lovenox  on 01/25/2011 01/26/11, 12/4, 12/5, 12/6---give lovenox injection 140mg  SQ daily  None day of procedure on 12/7 Procedure 01/30/2011 at 2:15pm Instructed wife to please call coumadin clinic after procedure regarding coumadin and lovenox injections  Will e-script to [employer]

## 2011-01-23 ENCOUNTER — Telehealth: Payer: Self-pay | Admitting: Internal Medicine

## 2011-01-23 NOTE — Telephone Encounter (Signed)
Pt called to confirm his lab and Ct appt which I confirmed with him feb 4th 11-lab and 1215-CT. ( on old POF from [facility]) He was concerned because he did not have a time to see Dr. Donnald Garre on feb 6th. He would like it at 1100 that day because the next 2 weeks he will be busy with his wife for  preop and then knee surgery. I told him I will put in the scheduling request and that he should get an automated appt call reminder . He said he will call back first of Jan to confirm appt with Dr. Donnald Garre.I routed this message to [facility].

## 2011-01-24 NOTE — Assessment & Plan Note (Signed)
BP Readings from Last 3 Encounters:  01/21/11 118/62  09/03/10 104/68  03/06/10 112/68   Great control

## 2011-01-24 NOTE — Assessment & Plan Note (Signed)
pateint with a stable PSA, most likely suppressed by Fenasteride. He has a palpable nodule per Dr. Margarita Grizzle.  Plan - encouraged him to proceed with biopsy           Co[facility] clinic will assist in bridging him with lovenox

## 2011-01-24 NOTE — Assessment & Plan Note (Signed)
Patient did see Dr. Jens Som in July and was cleared for surgery. He is still cardiac stable in regard to prostate biopsy

## 2011-01-24 NOTE — Assessment & Plan Note (Signed)
Patient is very concerned that a funny feeling in his head and some subtle changes in cognitive function may represent metastatic disease.  Plan - MRI brain w,w/o contrast to r/o mets

## 2011-01-26 ENCOUNTER — Other Ambulatory Visit: Payer: Self-pay | Admitting: *Deleted

## 2011-01-26 ENCOUNTER — Ambulatory Visit (HOSPITAL_COMMUNITY)
Admission: RE | Admit: 2011-01-26 | Discharge: 2011-01-26 | Disposition: A | Discharge status: Home / Self Care | Payer: MEDICARE | Source: Ambulatory Visit | Attending: Internal Medicine | Admitting: Internal Medicine

## 2011-01-26 DIAGNOSIS — R51 Headache: Secondary | ICD-10-CM | POA: Insufficient documentation

## 2011-01-26 DIAGNOSIS — G319 Degenerative disease of nervous system, unspecified: Secondary | ICD-10-CM | POA: Insufficient documentation

## 2011-01-26 DIAGNOSIS — Z8673 Personal history of transient ischemic attack (TIA), and cerebral infarction without residual deficits: Secondary | ICD-10-CM | POA: Insufficient documentation

## 2011-01-26 DIAGNOSIS — Z85118 Personal history of other malignant neoplasm of bronchus and lung: Secondary | ICD-10-CM

## 2011-01-26 MED ORDER — ISOMETHEPTENE-DICHLORAL-APAP 65-100-325 MG PO CAPS: 1.0000 | ORAL_CAPSULE | Freq: Four times a day (QID) | ORAL | Status: DC | PRN

## 2011-01-26 MED ORDER — DIAZEPAM 5 MG PO TABS: 5.0000 mg | ORAL_TABLET | Freq: Two times a day (BID) | ORAL | Status: DC

## 2011-01-26 MED ORDER — GADOBENATE DIMEGLUMINE 529 MG/ML IV SOLN
20.0000 mL | Freq: Once | INTRAVENOUS | Status: AC | PRN
  Administered 2011-01-26: 20 mL via INTRAVENOUS

## 2011-01-30 ENCOUNTER — Telehealth: Payer: Self-pay | Admitting: *Deleted

## 2011-01-30 NOTE — Telephone Encounter (Signed)
MRI brain negative for any acute abnormality

## 2011-01-30 NOTE — Telephone Encounter (Signed)
Pt requesting MRI results.

## 2011-02-02 NOTE — Telephone Encounter (Signed)
Informed pts wife

## 2011-02-05 ENCOUNTER — Ambulatory Visit (INDEPENDENT_AMBULATORY_CARE_PROVIDER_SITE_OTHER): Payer: MEDICARE | Admitting: *Deleted

## 2011-02-05 DIAGNOSIS — G459 Transient cerebral ischemic attack, unspecified: Secondary | ICD-10-CM

## 2011-02-05 DIAGNOSIS — I4891 Unspecified atrial fibrillation: Secondary | ICD-10-CM

## 2011-02-09 ENCOUNTER — Ambulatory Visit (INDEPENDENT_AMBULATORY_CARE_PROVIDER_SITE_OTHER): Payer: MEDICARE | Admitting: *Deleted

## 2011-02-09 DIAGNOSIS — I4891 Unspecified atrial fibrillation: Secondary | ICD-10-CM

## 2011-02-09 DIAGNOSIS — G459 Transient cerebral ischemic attack, unspecified: Secondary | ICD-10-CM

## 2011-02-09 LAB — POCT INR: INR: 1.4

## 2011-02-13 ENCOUNTER — Ambulatory Visit (INDEPENDENT_AMBULATORY_CARE_PROVIDER_SITE_OTHER): Payer: MEDICARE | Admitting: *Deleted

## 2011-02-13 DIAGNOSIS — I4891 Unspecified atrial fibrillation: Secondary | ICD-10-CM

## 2011-02-13 DIAGNOSIS — G459 Transient cerebral ischemic attack, unspecified: Secondary | ICD-10-CM

## 2011-02-13 LAB — POCT INR: INR: 2.2

## 2011-03-05 ENCOUNTER — Ambulatory Visit (INDEPENDENT_AMBULATORY_CARE_PROVIDER_SITE_OTHER): Payer: MEDICARE | Admitting: *Deleted

## 2011-03-05 DIAGNOSIS — G459 Transient cerebral ischemic attack, unspecified: Secondary | ICD-10-CM

## 2011-03-05 DIAGNOSIS — I4891 Unspecified atrial fibrillation: Secondary | ICD-10-CM

## 2011-03-30 ENCOUNTER — Ambulatory Visit (HOSPITAL_COMMUNITY)
Admission: RE | Admit: 2011-03-30 | Discharge: 2011-03-30 | Disposition: A | Discharge status: Home / Self Care | Payer: MEDICARE | Source: Ambulatory Visit | Attending: Internal Medicine | Admitting: Internal Medicine

## 2011-03-30 ENCOUNTER — Encounter (HOSPITAL_COMMUNITY): Payer: Self-pay

## 2011-03-30 ENCOUNTER — Other Ambulatory Visit (HOSPITAL_BASED_OUTPATIENT_CLINIC_OR_DEPARTMENT_OTHER): Payer: MEDICARE | Admitting: Lab

## 2011-03-30 DIAGNOSIS — C3490 Malignant neoplasm of unspecified part of unspecified bronchus or lung: Secondary | ICD-10-CM | POA: Insufficient documentation

## 2011-03-30 DIAGNOSIS — K449 Diaphragmatic hernia without obstruction or gangrene: Secondary | ICD-10-CM | POA: Insufficient documentation

## 2011-03-30 DIAGNOSIS — G459 Transient cerebral ischemic attack, unspecified: Secondary | ICD-10-CM

## 2011-03-30 DIAGNOSIS — I2510 Atherosclerotic heart disease of native coronary artery without angina pectoris: Secondary | ICD-10-CM | POA: Insufficient documentation

## 2011-03-30 DIAGNOSIS — I4891 Unspecified atrial fibrillation: Secondary | ICD-10-CM

## 2011-03-30 DIAGNOSIS — N281 Cyst of kidney, acquired: Secondary | ICD-10-CM | POA: Insufficient documentation

## 2011-03-30 DIAGNOSIS — R51 Headache: Secondary | ICD-10-CM | POA: Insufficient documentation

## 2011-03-30 LAB — CBC WITH DIFFERENTIAL/PLATELET
BASO%: 0.4 % (ref 0.0–2.0)
Eosinophils Absolute: 0.1 10*3/uL (ref 0.0–0.5)
LYMPH%: 35.9 % (ref 14.0–49.0)
MCHC: 33.5 g/dL (ref 32.0–36.0)
MCV: 92.1 fL (ref 79.3–98.0)
MONO%: 6.9 % (ref 0.0–14.0)
NEUT%: 56 % (ref 39.0–75.0)
Platelets: 128 10*3/uL — ABNORMAL LOW (ref 140–400)
RBC: 4.73 10*6/uL (ref 4.20–5.82)

## 2011-03-30 LAB — CMP (CANCER CENTER ONLY)
Alkaline Phosphatase: 41 U/L (ref 26–84)
Creat: 1.1 mg/dl (ref 0.6–1.2)
Glucose, Bld: 101 mg/dL (ref 73–118)
Sodium: 144 mEq/L (ref 128–145)
Total Bilirubin: 0.6 mg/dl (ref 0.20–1.60)
Total Protein: 7.2 g/dL (ref 6.4–8.1)

## 2011-03-30 LAB — PROTIME-INR: Protime: 21.6 Seconds — ABNORMAL HIGH (ref 10.6–13.4)

## 2011-03-30 MED ORDER — IOHEXOL 300 MG/ML  SOLN
80.0000 mL | Freq: Once | INTRAMUSCULAR | Status: AC | PRN
  Administered 2011-03-30: 80 mL via INTRAVENOUS

## 2011-04-01 ENCOUNTER — Telehealth: Payer: Self-pay | Admitting: Internal Medicine

## 2011-04-01 ENCOUNTER — Ambulatory Visit (HOSPITAL_BASED_OUTPATIENT_CLINIC_OR_DEPARTMENT_OTHER): Payer: MEDICARE | Admitting: Internal Medicine

## 2011-04-01 VITALS — BP 111/70 | HR 67 | Temp 97.0°F

## 2011-04-01 DIAGNOSIS — C342 Malignant neoplasm of middle lobe, bronchus or lung: Secondary | ICD-10-CM

## 2011-04-01 NOTE — Telephone Encounter (Signed)
appts made and printed for 09/2011    aom

## 2011-04-01 NOTE — Progress Notes (Signed)
[facility] OFFICE PROGRESS NOTE  Illene Regulus, MD, MD [address] [facility] [place] [address]  PRINCIPAL DIAGNOSIS:  Stage IA non-small-cell lung cancer (T1a N0 M0) adenocarcinoma diagnosed in June 2010.  PRIOR THERAPY:  Status post right middle lobectomy under the care of Dr. [name] on August 28, 2008.  CURRENT THERAPY:  Observation.  INTERVAL HISTORY: Bernard [name] 78 y.o. male returns to the clinic today for six-month followup accompanied his wife. The patient is feeling fine with no specific complaints today except for persistent headache. He had MRI of the brain performed recently that showed no evidence for metastatic disease. There was also has repeat CT scan of the chest and he is here today for evaluation and discussion of his scan results.  MEDICAL HISTORY: Past Medical History  Diagnosis Date  . History of BPH     benign prostatic hypertrophy  . Paroxysmal atrial fibrillation     postoperative after lung surgery  . Osteoarthrosis, unspecified whether generalized or localized, lower leg     knee  . Labyrinthitis, unspecified   . Esophageal reflux   . Cerebrovascular disease     s/p prior stroke  . Unspecified essential hypertension   . Other and unspecified hyperlipidemia   . Depressive disorder, not elsewhere classified   . CAD (coronary artery disease)     s/p MI 72s, normal myoview 7/11  . Cancer     lung ca dx'd 2010  . Malignant neoplasm middle lobe, bronchus or lung     right lung. Dx 07/27/08, sp/p resection 7/10.     ALLERGIES:   has no known allergies.  MEDICATIONS:  Current Outpatient Prescriptions  Medication Sig Dispense Refill  . APAP-Isometheptene-Dichloral 325-65-100 MG per capsule Take 1 capsule by mouth 4 (four) times daily as needed.  30 capsule  3  . Calcium Carbonate (CALTRATE 600) 1500 MG TABS Take 1 tablet by mouth daily.        . cholecalciferol (VITAMIN D) 1000 UNITS tablet Take 1,000 Units by mouth daily.          . diazepam (VALIUM) 5 MG tablet Take 1 tablet (5 mg total) by mouth 2 (two) times daily.  180 tablet  3  . finasteride (PROSCAR) 5 MG tablet Take 1 tablet (5 mg total) by mouth daily.  90 tablet  1  . meclizine (ANTIVERT) 25 MG tablet Take 1 tablet (25 mg total) by mouth every 6 (six) hours as needed. 1/2 or 1 tablet prn for dizziness  90 tablet  1  . omeprazole (PRILOSEC) 20 MG capsule Take 1 capsule (20 mg total) by mouth daily. Every other day   90 capsule  1  . simvastatin (ZOCOR) 20 MG tablet Take 1 tablet (20 mg total) by mouth daily.  90 tablet  1  . terazosin (HYTRIN) 5 MG capsule Take 1 capsule (5 mg total) by mouth daily.  90 capsule  1  . warfarin (COUMADIN) 5 MG tablet Take 1 tablet (5 mg total) by mouth as directed.  90 tablet  1  . enoxaparin (LOVENOX) 150 MG/ML injection Inject 0.93 mLs (140 mg total) into the skin daily.  4 mL  1  . nitroGLYCERIN (NITROSTAT) 0.4 MG SL tablet Place 1 tablet (0.4 mg total) under the tongue every 5 (five) minutes as needed.  90 tablet  1  . oxyCODONE-acetaminophen (PERCOCET) 5-325 MG per tablet Take 1 tablet by mouth every 4 (four) hours as needed.  1 or 2 tablets fill on or after 01/21/2011  90 tablet  0    SURGICAL HISTORY:  Past Surgical History  Procedure Date  . Esophagogastroduodenoscopy 05/14/04  . Bunionectomy '89    left foot  . Inguinal hernia repair '71    left  . Anthroscopic surgery '10    right knee \  . Vat-rml lobectomy   . Right tkr 8/11    REVIEW OF SYSTEMS:  A comprehensive review of systems was negative except for: Constitutional: positive for headache   PHYSICAL EXAMINATION: General appearance: alert, cooperative and no distress Lymph nodes: Cervical, supraclavicular, and axillary nodes normal. Resp: clear to auscultation bilaterally Cardio: regular rate and rhythm, S1, S2 normal, no murmur, click, rub or gallop GI: soft, non-tender; bowel sounds normal; no masses,  no organomegaly Extremities: extremities normal,  atraumatic, no cyanosis or edema  ECOG PERFORMANCE STATUS: 1 - Symptomatic but completely ambulatory  Blood pressure 111/70, pulse 67, temperature 97 F (36.1 C), temperature source Oral.  LABORATORY DATA: Lab Results  Component Value Date   WBC 7.0 03/30/2011   HGB 14.6 03/30/2011   HCT 43.6 03/30/2011   MCV 92.1 03/30/2011   PLT 128* 03/30/2011      Chemistry      Component Value Date/Time   NA 144 03/30/2011 1048   NA 140 03/06/2010 1206   K 4.5 03/30/2011 1048   K 4.4 03/06/2010 1206   CL 105 03/30/2011 1048   CL 106 03/06/2010 1206   CO2 28 03/30/2011 1048   CO2 28 03/06/2010 1206   BUN 16 03/30/2011 1048   BUN 14 03/06/2010 1206   CREATININE 1.1 03/30/2011 1048   CREATININE 1.32 01/26/2011 1627      Component Value Date/Time   CALCIUM 8.4 03/30/2011 1048   CALCIUM 9.2 03/06/2010 1206   ALKPHOS 41 03/30/2011 1048   ALKPHOS 45 03/06/2010 1206   AST 24 03/30/2011 1048   AST 20 03/06/2010 1206   ALT 17 03/06/2010 1206   BILITOT 0.60 03/30/2011 1048   BILITOT 1.0 03/06/2010 1206       RADIOGRAPHIC STUDIES: Ct Chest W Contrast  03/30/2011  *RADIOLOGY REPORT*  Clinical Data: Lung cancer.  Headaches.  CT CHEST WITH CONTRAST  Technique:  Multidetector CT imaging of the chest was performed following the standard protocol during bolus administration of intravenous contrast.  Contrast: 80mL OMNIPAQUE IOHEXOL 300 MG/ML IV SOLN  Comparison: Chest CT 09/24/2010.  Findings:  Mediastinum: Heart size is mildly enlarged. There is no significant pericardial fluid, thickening or pericardial calcification. There is atherosclerosis of the thoracic aorta, the great vessels of the mediastinum and the coronary arteries, including calcified atherosclerotic plaque in the left main, left anterior descending and right coronary arteries. In the apex of the left ventricle there is a focal area of myocardial thinning, decreased attenuation throughout the myocardium, and focal outpouching, consistent with fibrofatty remodelling related  to remote LAD territory myocardial infarction. There is a small hiatal hernia. No pathologically enlarged mediastinal or hilar lymph nodes.  Lungs/Pleura: Postoperative changes of wedge resection in the right upper lobe and operative changes of prior right middle lobectomy are again noted.  Upper Abdomen:  Large low attenuation lesion in the upper pole right kidney (7.5 cm), consistent with a simple cyst. Calcifications in the periphery of the spleen likely related to remote trauma.  Musculoskeletal: There are no aggressive appearing lytic or blastic lesions noted in the visualized portions of the skeleton.  IMPRESSION: 1.Stable postoperative changes in the  right hemithorax, without evidence to suggest local recurrence or new metastatic disease in the thorax. 2.  Atherosclerosis, including left main and two-vessel coronary artery disease. Please note that although the presence of coronary artery calcium documents the presence of coronary artery disease, the severity of this disease and any potential stenosis cannot be assessed on this non-gated CT examination.  Assessment for potential risk factor modification, dietary therapy or pharmacologic therapy may be warranted, if clinically indicated. 3.  Fibrofatty remodelling and small left ventricular apical outpouching consistent with scarring related to remote LAD territory myocardial infarction. 4.  Small hiatal hernia.  5.  7.5 cm cyst in the upper pole of the right kidney again noted.  Original Report Authenticated By: Florencia Reasons, M.D.    ASSESSMENT: This is a very pleasant 78 years old white male with history of stage IA non-small cell lung cancer diagnosed in June 2010 status post right middle lobectomy. The patient is doing fine and he has no evidence for disease recurrence.   PLAN: I discussed the scan results with the patient and his wife. I recommended for him continuous observation for now with repeat CT scan of the chest in 6 months. He was  advised to call me immediately if has any concerning symptoms in the interval.   All questions were answered. The patient knows to call the clinic with any problems, questions or concerns. We can certainly see the patient much sooner if necessary.

## 2011-04-02 ENCOUNTER — Ambulatory Visit (INDEPENDENT_AMBULATORY_CARE_PROVIDER_SITE_OTHER): Payer: MEDICARE | Admitting: *Deleted

## 2011-04-02 DIAGNOSIS — G459 Transient cerebral ischemic attack, unspecified: Secondary | ICD-10-CM

## 2011-04-02 DIAGNOSIS — I4891 Unspecified atrial fibrillation: Secondary | ICD-10-CM

## 2011-04-02 LAB — POCT INR: INR: 2

## 2011-04-14 ENCOUNTER — Other Ambulatory Visit: Payer: Self-pay | Admitting: Internal Medicine

## 2011-04-14 DIAGNOSIS — E785 Hyperlipidemia, unspecified: Secondary | ICD-10-CM

## 2011-04-14 DIAGNOSIS — N400 Enlarged prostate without lower urinary tract symptoms: Secondary | ICD-10-CM

## 2011-04-15 ENCOUNTER — Other Ambulatory Visit (INDEPENDENT_AMBULATORY_CARE_PROVIDER_SITE_OTHER): Payer: MEDICARE

## 2011-04-15 DIAGNOSIS — E785 Hyperlipidemia, unspecified: Secondary | ICD-10-CM

## 2011-04-15 LAB — LIPID PANEL
LDL Cholesterol: 71 mg/dL (ref 0–99)
Total CHOL/HDL Ratio: 3
Triglycerides: 82 mg/dL (ref 0.0–149.0)
VLDL: 16.4 mg/dL (ref 0.0–40.0)

## 2011-04-15 LAB — HEPATIC FUNCTION PANEL
Albumin: 3.7 g/dL (ref 3.5–5.2)
Alkaline Phosphatase: 36 U/L — ABNORMAL LOW (ref 39–117)
Total Protein: 6.6 g/dL (ref 6.0–8.3)

## 2011-04-16 ENCOUNTER — Encounter: Payer: Self-pay | Admitting: Internal Medicine

## 2011-04-30 ENCOUNTER — Ambulatory Visit (INDEPENDENT_AMBULATORY_CARE_PROVIDER_SITE_OTHER): Payer: MEDICARE | Admitting: *Deleted

## 2011-04-30 DIAGNOSIS — G459 Transient cerebral ischemic attack, unspecified: Secondary | ICD-10-CM

## 2011-04-30 DIAGNOSIS — I4891 Unspecified atrial fibrillation: Secondary | ICD-10-CM

## 2011-04-30 DIAGNOSIS — C342 Malignant neoplasm of middle lobe, bronchus or lung: Secondary | ICD-10-CM

## 2011-04-30 LAB — POCT INR: INR: 2.2

## 2011-05-14 ENCOUNTER — Ambulatory Visit (INDEPENDENT_AMBULATORY_CARE_PROVIDER_SITE_OTHER): Payer: MEDICARE | Admitting: *Deleted

## 2011-05-14 DIAGNOSIS — C342 Malignant neoplasm of middle lobe, bronchus or lung: Secondary | ICD-10-CM

## 2011-05-14 DIAGNOSIS — I4891 Unspecified atrial fibrillation: Secondary | ICD-10-CM

## 2011-05-14 DIAGNOSIS — G459 Transient cerebral ischemic attack, unspecified: Secondary | ICD-10-CM

## 2011-05-14 LAB — POCT INR: INR: 2.3

## 2011-06-04 ENCOUNTER — Ambulatory Visit (INDEPENDENT_AMBULATORY_CARE_PROVIDER_SITE_OTHER): Payer: MEDICARE | Admitting: *Deleted

## 2011-06-04 DIAGNOSIS — G459 Transient cerebral ischemic attack, unspecified: Secondary | ICD-10-CM

## 2011-06-04 DIAGNOSIS — C342 Malignant neoplasm of middle lobe, bronchus or lung: Secondary | ICD-10-CM

## 2011-06-04 DIAGNOSIS — I4891 Unspecified atrial fibrillation: Secondary | ICD-10-CM

## 2011-06-04 LAB — POCT INR: INR: 2.4

## 2011-07-02 ENCOUNTER — Ambulatory Visit (INDEPENDENT_AMBULATORY_CARE_PROVIDER_SITE_OTHER): Payer: MEDICARE

## 2011-07-02 DIAGNOSIS — G459 Transient cerebral ischemic attack, unspecified: Secondary | ICD-10-CM

## 2011-07-02 DIAGNOSIS — C342 Malignant neoplasm of middle lobe, bronchus or lung: Secondary | ICD-10-CM

## 2011-07-02 DIAGNOSIS — I4891 Unspecified atrial fibrillation: Secondary | ICD-10-CM

## 2011-07-30 ENCOUNTER — Ambulatory Visit (INDEPENDENT_AMBULATORY_CARE_PROVIDER_SITE_OTHER): Payer: MEDICARE | Admitting: Pharmacist

## 2011-07-30 DIAGNOSIS — I4891 Unspecified atrial fibrillation: Secondary | ICD-10-CM

## 2011-07-30 DIAGNOSIS — G459 Transient cerebral ischemic attack, unspecified: Secondary | ICD-10-CM

## 2011-07-30 DIAGNOSIS — C342 Malignant neoplasm of middle lobe, bronchus or lung: Secondary | ICD-10-CM

## 2011-07-30 LAB — POCT INR: INR: 1.8

## 2011-08-07 ENCOUNTER — Other Ambulatory Visit: Payer: Self-pay | Admitting: Internal Medicine

## 2011-08-07 NOTE — Telephone Encounter (Signed)
Patient request refill on diazepam. Dr. [name] out of office . LOV  01/21/2011

## 2011-08-13 ENCOUNTER — Other Ambulatory Visit: Payer: Self-pay | Admitting: *Deleted

## 2011-08-13 NOTE — Telephone Encounter (Signed)
Ok x 5 

## 2011-08-13 NOTE — Telephone Encounter (Signed)
Patient request refill on valium 5mg . LOV 12/2010

## 2011-08-17 MED ORDER — DIAZEPAM 5 MG PO TABS: 5.0000 mg | ORAL_TABLET | Freq: Two times a day (BID) | ORAL | Status: DC

## 2011-08-17 NOTE — Telephone Encounter (Signed)
Done

## 2011-08-28 ENCOUNTER — Ambulatory Visit (INDEPENDENT_AMBULATORY_CARE_PROVIDER_SITE_OTHER): Payer: MEDICARE | Admitting: *Deleted

## 2011-08-28 DIAGNOSIS — C342 Malignant neoplasm of middle lobe, bronchus or lung: Secondary | ICD-10-CM

## 2011-08-28 DIAGNOSIS — G459 Transient cerebral ischemic attack, unspecified: Secondary | ICD-10-CM

## 2011-08-28 DIAGNOSIS — I4891 Unspecified atrial fibrillation: Secondary | ICD-10-CM

## 2011-09-05 ENCOUNTER — Ambulatory Visit (INDEPENDENT_AMBULATORY_CARE_PROVIDER_SITE_OTHER): Payer: MEDICARE | Admitting: Family Medicine

## 2011-09-05 ENCOUNTER — Encounter: Payer: Self-pay | Admitting: Family Medicine

## 2011-09-05 VITALS — BP 110/64 | HR 64 | Temp 98.6°F | Resp 16 | Wt 207.8 lb

## 2011-09-05 DIAGNOSIS — R300 Dysuria: Secondary | ICD-10-CM

## 2011-09-05 MED ORDER — CIPROFLOXACIN HCL 250 MG PO TABS: 250.0000 mg | ORAL_TABLET | Freq: Two times a day (BID) | ORAL | Status: DC

## 2011-09-05 MED ORDER — CIPROFLOXACIN HCL 250 MG PO TABS: 250.0000 mg | ORAL_TABLET | Freq: Two times a day (BID) | ORAL | Status: AC

## 2011-09-05 NOTE — Progress Notes (Signed)
Subjective:    Patient ID: Bernard [name], male    DOB: 1932/12/17, 79 y.o.   MRN: [id number]  HPI  Very pleasant 78 yo male new to me presents to weekend clinic with complaint of dysuria. Chief complaints states that he has been vomiting but pt states he has not been vomiting nor has been nauseated.  Dysuria started a few days ago, getting progressively worse. Took AZO this morning.  No fevers. No back pain. No hematuria.  He is on long term anticoagulation for Afib, Tia.  Patient Active Problem List  Diagnosis  . ADENOCARCINOMA, RIGHT LUNG, MIDDLE LOBE  . HYPERLIPIDEMIA  . DEPRESSION  . LABRYNTHITIS  . HYPERTENSION  . CORONARY ARTERY DISEASE  . ATRIAL FIBRILLATION, PAROXYSMAL  . BASILAR ARTERY SYNDROME  . CEREBROVASCULAR DISEASE  . GERD  . OSTEOARTHRITIS, KNEE  . VENOUS INSUFFICIENCY, LEGS  . HYPERTROPHY PROSTATE W/O UR OBST & OTH LUTS  . TIA (transient ischemic attack)   Past Medical History  Diagnosis Date  . History of BPH     benign prostatic hypertrophy  . Paroxysmal atrial fibrillation     postoperative after lung surgery  . Osteoarthrosis, unspecified whether generalized or localized, lower leg     knee  . Labyrinthitis, unspecified   . Esophageal reflux   . Cerebrovascular disease     s/p prior stroke  . Unspecified essential hypertension   . Other and unspecified hyperlipidemia   . Depressive disorder, not elsewhere classified   . CAD (coronary artery disease)     s/p MI 51s, normal myoview 7/11  . Cancer     lung ca dx'd 2010  . Malignant neoplasm middle lobe, bronchus or lung     right lung. Dx 07/27/08, sp/p resection 7/10.    Past Surgical History  Procedure Date  . Esophagogastroduodenoscopy 05/14/04  . Bunionectomy '89    left foot  . Inguinal hernia repair '71    left  . Anthroscopic surgery '10    right knee \  . Vat-rml lobectomy   . Right tkr 8/11   History  Substance Use Topics  . Smoking status: Never Smoker   . Smokeless  tobacco: Not on file  . Alcohol Use: No   Family History  Problem Relation Age of Onset  . Diabetes insipidus      2nd kin   . Coronary artery disease Neg Hx   . Colon cancer Neg Hx   . Prostate cancer Neg Hx    No Known Allergies Current Outpatient Prescriptions on File Prior to Visit  Medication Sig Dispense Refill  . APAP-Isometheptene-Dichloral 325-65-100 MG per capsule Take 1 capsule by mouth 4 (four) times daily as needed.  30 capsule  3  . Calcium Carbonate (CALTRATE 600) 1500 MG TABS Take 1 tablet by mouth daily.        . cholecalciferol (VITAMIN D) 1000 UNITS tablet Take 1,000 Units by mouth daily.        . diazepam (VALIUM) 5 MG tablet Take 1 tablet (5 mg total) by mouth 2 (two) times daily.  60 tablet  5  . enoxaparin (LOVENOX) 150 MG/ML injection Inject 0.93 mLs (140 mg total) into the skin daily.  4 mL  1  . finasteride (PROSCAR) 5 MG tablet TAKE ONE TABLET BY MOUTH EVERY DAY  90 tablet  3  . meclizine (ANTIVERT) 25 MG tablet Take 1 tablet (25 mg total) by mouth every 6 (six) hours as needed. 1/2 or 1 tablet prn for  dizziness  90 tablet  1  . nitroGLYCERIN (NITROSTAT) 0.4 MG SL tablet Place 1 tablet (0.4 mg total) under the tongue every 5 (five) minutes as needed.  90 tablet  1  . omeprazole (PRILOSEC) 20 MG capsule Take 1 capsule (20 mg total) by mouth daily. Every other day   90 capsule  1  . oxyCODONE-acetaminophen (PERCOCET) 5-325 MG per tablet Take 1 tablet by mouth every 4 (four) hours as needed. 1 or 2 tablets fill on or after 01/21/2011  90 tablet  0  . simvastatin (ZOCOR) 20 MG tablet TAKE ONE TABLET BY MOUTH EVERY DAY  90 tablet  3  . terazosin (HYTRIN) 5 MG capsule Take 1 capsule (5 mg total) by mouth daily.  90 capsule  1  . warfarin (COUMADIN) 5 MG tablet Take 1 tablet (5 mg total) by mouth as directed.  90 tablet  1   The PMH, PSH, Social History, Family History, Medications, and allergies have been reviewed in [facility], and have been updated if relevant.  Review  of Systems See HPI   No abdominal pain Objective:   Physical Exam BP 110/64  Pulse 64  Temp 98.6 F (37 C) (Oral)  Resp 16  Wt 207 lb 12 oz (94.235 kg)  SpO2 96% Gen:  Alert, pleasant, NAD and non toxic appearing. HEENT:  Moist mucous membranes Abd:  Soft, NT, bos BS MSK:  No cva tenderness     Assessment & Plan:   1. Dysuria  Urine culture  New- unable to read UA due to AZO use. Will treat for presumed complicated UTI with 10 day course of cipro. Send urine for cx. Discussed importance of keeping his INR appt next week. See pt instructions for complete details. The patient indicates understanding of these issues and agrees with the plan.

## 2011-09-05 NOTE — Patient Instructions (Addendum)
Nice to meet you. We are treating you for a UTI- we will call you with the results of your urine culture next week. Please be aware that antibiotics can affect the thickness of your blood when you are on coumadin. Please make sure you call your doctor on Monday to schedule a coumadin check in the next week or two. Please go to urgent care or ER if symptoms worsen over the weekend.

## 2011-09-09 ENCOUNTER — Ambulatory Visit: Payer: MEDICARE | Admitting: Internal Medicine

## 2011-09-11 ENCOUNTER — Telehealth: Payer: Self-pay | Admitting: Internal Medicine

## 2011-09-11 NOTE — Telephone Encounter (Signed)
s/w spouse and she is aware of appt change

## 2011-09-24 ENCOUNTER — Other Ambulatory Visit: Payer: Self-pay | Admitting: *Deleted

## 2011-09-24 ENCOUNTER — Ambulatory Visit (INDEPENDENT_AMBULATORY_CARE_PROVIDER_SITE_OTHER): Payer: MEDICARE

## 2011-09-24 DIAGNOSIS — C342 Malignant neoplasm of middle lobe, bronchus or lung: Secondary | ICD-10-CM

## 2011-09-24 DIAGNOSIS — G459 Transient cerebral ischemic attack, unspecified: Secondary | ICD-10-CM

## 2011-09-24 DIAGNOSIS — I4891 Unspecified atrial fibrillation: Secondary | ICD-10-CM

## 2011-09-29 ENCOUNTER — Ambulatory Visit (HOSPITAL_COMMUNITY)
Admission: RE | Admit: 2011-09-29 | Discharge: 2011-09-29 | Disposition: A | Discharge status: Home / Self Care | Payer: MEDICARE | Source: Ambulatory Visit | Attending: Internal Medicine | Admitting: Internal Medicine

## 2011-09-29 ENCOUNTER — Other Ambulatory Visit (HOSPITAL_BASED_OUTPATIENT_CLINIC_OR_DEPARTMENT_OTHER): Payer: MEDICARE

## 2011-09-29 ENCOUNTER — Ambulatory Visit: Payer: MEDICARE | Admitting: Internal Medicine

## 2011-09-29 DIAGNOSIS — C3490 Malignant neoplasm of unspecified part of unspecified bronchus or lung: Secondary | ICD-10-CM | POA: Insufficient documentation

## 2011-09-29 DIAGNOSIS — C342 Malignant neoplasm of middle lobe, bronchus or lung: Secondary | ICD-10-CM

## 2011-09-29 LAB — CBC WITH DIFFERENTIAL/PLATELET
BASO%: 0.5 % (ref 0.0–2.0)
HCT: 40.4 % (ref 38.4–49.9)
LYMPH%: 38 % (ref 14.0–49.0)
MCH: 30.6 pg (ref 27.2–33.4)
MCHC: 33.3 g/dL (ref 32.0–36.0)
MCV: 92.1 fL (ref 79.3–98.0)
MONO%: 7.6 % (ref 0.0–14.0)
NEUT%: 53.1 % (ref 39.0–75.0)
Platelets: 128 10*3/uL — ABNORMAL LOW (ref 140–400)
RBC: 4.39 10*6/uL (ref 4.20–5.82)
WBC: 6.2 10*3/uL (ref 4.0–10.3)

## 2011-09-29 LAB — CMP (CANCER CENTER ONLY)
ALT(SGPT): 27 U/L (ref 10–47)
Alkaline Phosphatase: 38 U/L (ref 26–84)
CO2: 28 mEq/L (ref 18–33)
Creat: 1.3 mg/dl — ABNORMAL HIGH (ref 0.6–1.2)
Sodium: 141 mEq/L (ref 128–145)
Total Bilirubin: 1 mg/dl (ref 0.20–1.60)
Total Protein: 6.7 g/dL (ref 6.4–8.1)

## 2011-09-29 MED ORDER — IOHEXOL 300 MG/ML  SOLN
80.0000 mL | Freq: Once | INTRAMUSCULAR | Status: AC | PRN
  Administered 2011-09-29: 80 mL via INTRAVENOUS

## 2011-10-01 ENCOUNTER — Ambulatory Visit: Payer: MEDICARE | Admitting: Internal Medicine

## 2011-10-05 ENCOUNTER — Ambulatory Visit (HOSPITAL_BASED_OUTPATIENT_CLINIC_OR_DEPARTMENT_OTHER): Payer: MEDICARE | Admitting: Internal Medicine

## 2011-10-05 VITALS — BP 113/76 | HR 65 | Temp 97.0°F | Resp 18 | Ht 70.0 in | Wt 206.3 lb

## 2011-10-05 DIAGNOSIS — C342 Malignant neoplasm of middle lobe, bronchus or lung: Secondary | ICD-10-CM

## 2011-10-05 NOTE — Progress Notes (Signed)
[facility] [phone]   [phone]  OFFICE PROGRESS NOTE  Illene Regulus, MD [address] [place] [address]  PRINCIPAL DIAGNOSIS: Stage IA non-small-cell lung cancer (T1a N0 M0) adenocarcinoma diagnosed in June 2010.   PRIOR THERAPY: Status post right middle lobectomy under the care of Dr. [name] on August 28, 2008.   CURRENT THERAPY: Observation.  INTERVAL HISTORY: Bernard [name] 78 y.o. male returns to the clinic today for six-month followup visit accompanied his wife. The patient is doing fine today with no specific complaints. He denied having any significant chest pain or shortness breath, no cough or hemoptysis. He denied having any significant weight loss or night sweats. The patient has repeat CT scan of the chest performed recently and he is here today for evaluation and discussion of his scan results.  MEDICAL HISTORY: Past Medical History  Diagnosis Date  . History of BPH     benign prostatic hypertrophy  . Paroxysmal atrial fibrillation     postoperative after lung surgery  . Osteoarthrosis, unspecified whether generalized or localized, lower leg     knee  . Labyrinthitis, unspecified   . Esophageal reflux   . Cerebrovascular disease     s/p prior stroke  . Unspecified essential hypertension   . Other and unspecified hyperlipidemia   . Depressive disorder, not elsewhere classified   . CAD (coronary artery disease)     s/p MI 14s, normal myoview 7/11  . Cancer     lung ca dx'd 2010  . Malignant neoplasm middle lobe, bronchus or lung     right lung. Dx 07/27/08, sp/p resection 7/10.     ALLERGIES:   has no known allergies.  MEDICATIONS:  Current Outpatient Prescriptions  Medication Sig Dispense Refill  . Calcium Carbonate (CALTRATE 600) 1500 MG TABS Take 1 tablet by mouth daily.        . cholecalciferol (VITAMIN D) 1000 UNITS tablet Take 1,000 Units by mouth daily.        . diazepam (VALIUM) 5 MG tablet Take 1  tablet (5 mg total) by mouth 2 (two) times daily.  60 tablet  5  . finasteride (PROSCAR) 5 MG tablet TAKE ONE TABLET BY MOUTH EVERY DAY  90 tablet  3  . omeprazole (PRILOSEC) 20 MG capsule Take 1 capsule (20 mg total) by mouth daily. Every other day   90 capsule  1  . simvastatin (ZOCOR) 20 MG tablet TAKE ONE TABLET BY MOUTH EVERY DAY  90 tablet  3  . terazosin (HYTRIN) 5 MG capsule Take 1 capsule (5 mg total) by mouth daily.  90 capsule  1  . warfarin (COUMADIN) 5 MG tablet Take 1 tablet (5 mg total) by mouth as directed.  90 tablet  1  . APAP-Isometheptene-Dichloral 325-65-100 MG per capsule Take 1 capsule by mouth 4 (four) times daily as needed.  30 capsule  3  . meclizine (ANTIVERT) 25 MG tablet Take 1 tablet (25 mg total) by mouth every 6 (six) hours as needed. 1/2 or 1 tablet prn for dizziness  90 tablet  1  . nitroGLYCERIN (NITROSTAT) 0.4 MG SL tablet Place 1 tablet (0.4 mg total) under the tongue every 5 (five) minutes as needed.  90 tablet  1    SURGICAL HISTORY:  Past Surgical History  Procedure Date  . Esophagogastroduodenoscopy 05/14/04  . Bunionectomy '89    left foot  . Inguinal hernia repair '71    left  .  Anthroscopic surgery '10    right knee \  . Vat-rml lobectomy   . Right tkr 8/11    REVIEW OF SYSTEMS:  A comprehensive review of systems was negative.   PHYSICAL EXAMINATION: General appearance: alert, cooperative and no distress Head: Normocephalic, without obvious abnormality, atraumatic Neck: no adenopathy Lymph nodes: Cervical, supraclavicular, and axillary nodes normal. Resp: clear to auscultation bilaterally Cardio: regular rate and rhythm, S1, S2 normal, no murmur, click, rub or gallop GI: soft, non-tender; bowel sounds normal; no masses,  no organomegaly Extremities: extremities normal, atraumatic, no cyanosis or edema Neurologic: Alert and oriented X 3, normal strength and tone. Normal symmetric reflexes. Normal coordination and gait  ECOG PERFORMANCE  STATUS: 0 - Asymptomatic  Blood pressure 113/76, pulse 65, temperature 97 F (36.1 C), temperature source Oral, resp. rate 18, height 5\' 10"  (1.778 m), weight 206 lb 4.8 oz (93.577 kg).  LABORATORY DATA: Lab Results  Component Value Date   WBC 6.2 09/29/2011   HGB 13.4 09/29/2011   HCT 40.4 09/29/2011   MCV 92.1 09/29/2011   PLT 128* 09/29/2011      Chemistry      Component Value Date/Time   NA 141 09/29/2011 1218   NA 140 03/06/2010 1206   K 4.4 09/29/2011 1218   K 4.4 03/06/2010 1206   CL 103 09/29/2011 1218   CL 106 03/06/2010 1206   CO2 28 09/29/2011 1218   CO2 28 03/06/2010 1206   BUN 16 09/29/2011 1218   BUN 14 03/06/2010 1206   CREATININE 1.3* 09/29/2011 1218   CREATININE 1.32 01/26/2011 1627      Component Value Date/Time   CALCIUM 8.8 09/29/2011 1218   CALCIUM 9.2 03/06/2010 1206   ALKPHOS 38 09/29/2011 1218   ALKPHOS 36* 04/15/2011 1020   AST 29 09/29/2011 1218   AST 22 04/15/2011 1020   ALT 21 04/15/2011 1020   BILITOT 1.00 09/29/2011 1218   BILITOT 0.4 04/15/2011 1020       RADIOGRAPHIC STUDIES: Ct Chest W Contrast  09/29/2011  *RADIOLOGY REPORT*  Clinical Data: Lung cancer restaging  CT CHEST WITH CONTRAST  Technique:  Multidetector CT imaging of the chest was performed following the standard protocol during bolus administration of intravenous contrast.  Contrast: 80mL OMNIPAQUE IOHEXOL 300 MG/ML  SOLN  Comparison: CT 03/30/2011  Findings:  Review of lung parenchyma demonstrates postsurgical change in the right upper lobe with linear scarring.  No new nodularity.  The left lung is clear.  No axillary or supraclavicular lymphadenopathy.  No mediastinal or hilar lymphadenopathy.  No pericardial fluid.  The esophagus is normal.  Limited view of the upper abdomen demonstrates normal adrenal glands.  There is a large simple cyst in the right kidney.  Review of the bone windows demonstrates no aggressive osseous lesions.  IMPRESSION:  1.  Stable exam chest. 2.  Postsurgical change in the right hemithorax  with out evidence of lung cancer recurrence.  Original Report Authenticated By: Genevive Bi, M.D.    ASSESSMENT: This is a very pleasant 78 years old white male with history of stage IA non-small cell lung cancer status post right middle lobectomy in July 2010. The patient has been observation since that time was no evidence for disease recurrence.  PLAN: I discussed the scan results with the patient and his wife. I recommended for him continuous observation with repeat CT scan of the chest without contrast in one year. He would come back for followup visit at that time. He was advised  to call me immediately if he has any concerning symptoms in the interval.  All questions were answered. The patient knows to call the clinic with any problems, questions or concerns. We can certainly see the patient much sooner if necessary.

## 2011-10-09 ENCOUNTER — Telehealth: Payer: Self-pay | Admitting: Internal Medicine

## 2011-10-09 ENCOUNTER — Other Ambulatory Visit: Payer: Self-pay | Admitting: *Deleted

## 2011-10-09 NOTE — Telephone Encounter (Signed)
S/w pt today re appts for 10/03/12 lb/ct and 10/06/12 MM. Confirmed w/Stephanie appts lb/fu/ct to be 45yr and ct to  Be w/o contrast. See also 10/09/11 pof.

## 2011-10-15 ENCOUNTER — Ambulatory Visit (INDEPENDENT_AMBULATORY_CARE_PROVIDER_SITE_OTHER): Payer: MEDICARE | Admitting: Pharmacist

## 2011-10-15 DIAGNOSIS — C342 Malignant neoplasm of middle lobe, bronchus or lung: Secondary | ICD-10-CM

## 2011-10-15 DIAGNOSIS — I4891 Unspecified atrial fibrillation: Secondary | ICD-10-CM

## 2011-10-15 DIAGNOSIS — G459 Transient cerebral ischemic attack, unspecified: Secondary | ICD-10-CM

## 2011-10-15 LAB — POCT INR: INR: 2.2

## 2011-10-23 ENCOUNTER — Ambulatory Visit (INDEPENDENT_AMBULATORY_CARE_PROVIDER_SITE_OTHER): Payer: MEDICARE | Admitting: Internal Medicine

## 2011-10-23 ENCOUNTER — Encounter: Payer: Self-pay | Admitting: Internal Medicine

## 2011-10-23 VITALS — BP 120/70 | HR 70 | Resp 18 | Ht 69.0 in | Wt 206.0 lb

## 2011-10-23 DIAGNOSIS — I2510 Atherosclerotic heart disease of native coronary artery without angina pectoris: Secondary | ICD-10-CM

## 2011-10-23 DIAGNOSIS — I4891 Unspecified atrial fibrillation: Secondary | ICD-10-CM

## 2011-10-23 NOTE — Patient Instructions (Signed)
Your physician wants you to follow-up in: 12 months with Dr Allred You will receive a reminder letter in the mail two months in advance. If you don't receive a letter, please call our office to schedule the follow-up appointment.  

## 2011-10-23 NOTE — Assessment & Plan Note (Signed)
No ischemic symptoms Normal preop myoview 7/11 No changes today. 

## 2011-10-23 NOTE — Assessment & Plan Note (Signed)
Maintaining sinus rhythm Continue anticoagulation long term

## 2011-10-23 NOTE — Progress Notes (Signed)
PCP: Illene Regulus, MD  The patient presents today for routine electrophysiology followup.  Since last being seen in our clinic, the patient reports doing very well.  He is unaware of any further afib.  He reports postural dizziness at times.  Today, he denies symptoms of palpitations, chest pain, shortness of breath, orthopnea, PND, lower extremity edema, presyncope, syncope, or neurologic sequela.  The patient feels that he is tolerating medications without difficulties and is otherwise without complaint today.   Past Medical History  Diagnosis Date  . History of BPH     benign prostatic hypertrophy  . Paroxysmal atrial fibrillation     postoperative after lung surgery  . Osteoarthrosis, unspecified whether generalized or localized, lower leg     knee  . Labyrinthitis, unspecified   . Esophageal reflux   . Cerebrovascular disease     s/p prior stroke  . Unspecified essential hypertension   . Other and unspecified hyperlipidemia   . Depressive disorder, not elsewhere classified   . CAD (coronary artery disease)     s/p MI 55s, normal myoview 7/11  . Cancer     lung ca dx'd 2010  . Malignant neoplasm middle lobe, bronchus or lung     right lung. Dx 07/27/08, sp/p resection 7/10.    Past Surgical History  Procedure Date  . Esophagogastroduodenoscopy 05/14/04  . Bunionectomy '89    left foot  . Inguinal hernia repair '71    left  . Anthroscopic surgery '10    right knee \  . Vat-rml lobectomy   . Right tkr 8/11    Current Outpatient Prescriptions  Medication Sig Dispense Refill  . APAP-Isometheptene-Dichloral 325-65-100 MG per capsule Take 1 capsule by mouth 4 (four) times daily as needed.  30 capsule  3  . Calcium Carbonate (CALTRATE 600) 1500 MG TABS Take 1 tablet by mouth daily.        . cholecalciferol (VITAMIN D) 1000 UNITS tablet Take 1,000 Units by mouth daily.        . diazepam (VALIUM) 5 MG tablet Take 1 tablet (5 mg total) by mouth 2 (two) times daily.  60 tablet   5  . finasteride (PROSCAR) 5 MG tablet TAKE ONE TABLET BY MOUTH EVERY DAY  90 tablet  3  . meclizine (ANTIVERT) 25 MG tablet Take 1 tablet (25 mg total) by mouth every 6 (six) hours as needed. 1/2 or 1 tablet prn for dizziness  90 tablet  1  . nitroGLYCERIN (NITROSTAT) 0.4 MG SL tablet Place 1 tablet (0.4 mg total) under the tongue every 5 (five) minutes as needed.  90 tablet  1  . omeprazole (PRILOSEC) 20 MG capsule Take 1 capsule (20 mg total) by mouth daily. Every other day   90 capsule  1  . simvastatin (ZOCOR) 20 MG tablet TAKE ONE TABLET BY MOUTH EVERY DAY  90 tablet  3  . terazosin (HYTRIN) 5 MG capsule Take 1 capsule (5 mg total) by mouth daily.  90 capsule  1  . warfarin (COUMADIN) 5 MG tablet Take 1 tablet (5 mg total) by mouth as directed.  90 tablet  1    No Known Allergies  History   Social History  . Marital Status: Married    Spouse Name: N/A    Number of Children: N/A  . Years of Education: N/A   Occupational History  . Not on file.   Social History Main Topics  . Smoking status: Never Smoker   . Smokeless tobacco:  Not on file  . Alcohol Use: No  . Drug Use: No  . Sexually Active: Not on file   Other Topics Concern  . Not on file   Social History Narrative   Married '70. 1 son '75. Lives with wife independently in [place]. 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 [employer]. 09/10/09.     Family History  Problem Relation Age of Onset  . Diabetes insipidus      2nd kin   . Coronary artery disease Neg Hx   . Colon cancer Neg Hx   . Prostate cancer Neg Hx    Physical Exam: Filed Vitals:   10/23/11 1206  BP: 120/70  Pulse: 70  Resp: 18  Height: 5\' 9"  (1.753 m)  Weight: 206 lb (93.441 kg)  SpO2: 98%    GEN- The patient is well appearing, alert and oriented x 3 today.   Head- normocephalic, atraumatic Eyes-  Sclera clear, conjunctiva pink Ears- hearing intact Oropharynx-  clear Neck- supple, no JVP Lymph- no cervical lymphadenopathy Lungs- Clear to ausculation bilaterally, normal work of breathing Heart- Regular rate and rhythm, no murmurs, rubs or gallops, PMI not laterally displaced GI- soft, NT, ND, + BS Extremities- no clubbing, cyanosis, or edema MS- no significant deformity or atrophy Skin- no rash or lesion Psych- euthymic mood, full affect Neuro- strength and sensation are intact  ekg today reveals sinus rhythm 60 bpm, otherwise normal ekg  Assessment and Plan:

## 2011-11-19 ENCOUNTER — Ambulatory Visit (INDEPENDENT_AMBULATORY_CARE_PROVIDER_SITE_OTHER): Payer: MEDICARE | Admitting: *Deleted

## 2011-11-19 DIAGNOSIS — G459 Transient cerebral ischemic attack, unspecified: Secondary | ICD-10-CM

## 2011-11-19 DIAGNOSIS — C342 Malignant neoplasm of middle lobe, bronchus or lung: Secondary | ICD-10-CM

## 2011-11-19 DIAGNOSIS — I4891 Unspecified atrial fibrillation: Secondary | ICD-10-CM

## 2011-11-19 LAB — POCT INR: INR: 2.6

## 2011-12-10 ENCOUNTER — Ambulatory Visit (INDEPENDENT_AMBULATORY_CARE_PROVIDER_SITE_OTHER): Payer: MEDICARE | Admitting: *Deleted

## 2011-12-10 DIAGNOSIS — Z23 Encounter for immunization: Secondary | ICD-10-CM

## 2011-12-31 ENCOUNTER — Ambulatory Visit (INDEPENDENT_AMBULATORY_CARE_PROVIDER_SITE_OTHER): Payer: MEDICARE | Admitting: General Practice

## 2011-12-31 DIAGNOSIS — C342 Malignant neoplasm of middle lobe, bronchus or lung: Secondary | ICD-10-CM

## 2011-12-31 DIAGNOSIS — I4891 Unspecified atrial fibrillation: Secondary | ICD-10-CM

## 2011-12-31 DIAGNOSIS — G459 Transient cerebral ischemic attack, unspecified: Secondary | ICD-10-CM

## 2011-12-31 LAB — POCT INR: INR: 2.5

## 2012-02-05 ENCOUNTER — Ambulatory Visit (INDEPENDENT_AMBULATORY_CARE_PROVIDER_SITE_OTHER): Payer: MEDICARE | Admitting: General Practice

## 2012-02-05 DIAGNOSIS — I4891 Unspecified atrial fibrillation: Secondary | ICD-10-CM

## 2012-02-05 DIAGNOSIS — C342 Malignant neoplasm of middle lobe, bronchus or lung: Secondary | ICD-10-CM

## 2012-02-05 DIAGNOSIS — G459 Transient cerebral ischemic attack, unspecified: Secondary | ICD-10-CM

## 2012-02-05 LAB — POCT INR: INR: 2.3

## 2012-03-18 ENCOUNTER — Ambulatory Visit (INDEPENDENT_AMBULATORY_CARE_PROVIDER_SITE_OTHER): Payer: MEDICARE | Admitting: General Practice

## 2012-03-18 DIAGNOSIS — G459 Transient cerebral ischemic attack, unspecified: Secondary | ICD-10-CM

## 2012-03-18 DIAGNOSIS — I4891 Unspecified atrial fibrillation: Secondary | ICD-10-CM

## 2012-03-18 DIAGNOSIS — C342 Malignant neoplasm of middle lobe, bronchus or lung: Secondary | ICD-10-CM

## 2012-03-21 ENCOUNTER — Ambulatory Visit (INDEPENDENT_AMBULATORY_CARE_PROVIDER_SITE_OTHER): Payer: MEDICARE | Admitting: Internal Medicine

## 2012-03-21 ENCOUNTER — Other Ambulatory Visit (INDEPENDENT_AMBULATORY_CARE_PROVIDER_SITE_OTHER): Payer: MEDICARE

## 2012-03-21 ENCOUNTER — Encounter: Payer: Self-pay | Admitting: Internal Medicine

## 2012-03-21 ENCOUNTER — Other Ambulatory Visit: Payer: Self-pay | Admitting: *Deleted

## 2012-03-21 VITALS — BP 118/72 | HR 63 | Temp 97.0°F | Resp 10 | Ht 69.0 in | Wt 210.1 lb

## 2012-03-21 DIAGNOSIS — C342 Malignant neoplasm of middle lobe, bronchus or lung: Secondary | ICD-10-CM

## 2012-03-21 DIAGNOSIS — E785 Hyperlipidemia, unspecified: Secondary | ICD-10-CM

## 2012-03-21 DIAGNOSIS — I2510 Atherosclerotic heart disease of native coronary artery without angina pectoris: Secondary | ICD-10-CM

## 2012-03-21 DIAGNOSIS — IMO0002 Reserved for concepts with insufficient information to code with codable children: Secondary | ICD-10-CM

## 2012-03-21 DIAGNOSIS — I10 Essential (primary) hypertension: Secondary | ICD-10-CM

## 2012-03-21 DIAGNOSIS — Z23 Encounter for immunization: Secondary | ICD-10-CM

## 2012-03-21 DIAGNOSIS — Z0000 Encounter for general adult medical examination without abnormal findings: Secondary | ICD-10-CM | POA: Insufficient documentation

## 2012-03-21 DIAGNOSIS — M1710 Unilateral primary osteoarthritis, unspecified knee: Secondary | ICD-10-CM

## 2012-03-21 LAB — LIPID PANEL
HDL: 40.4 mg/dL (ref >39.00)
LDL Cholesterol: 85 mg/dL (ref 0–99)
Total CHOL/HDL Ratio: 4
Triglycerides: 93 mg/dL (ref 0.0–149.0)
VLDL: 18.6 mg/dL (ref 0.0–40.0)

## 2012-03-21 MED ORDER — ISOMETHEPTENE-DICHLORAL-APAP 65-100-325 MG PO CAPS: 1.0000 | ORAL_CAPSULE | Freq: Four times a day (QID) | ORAL | Status: DC | PRN

## 2012-03-21 MED ORDER — WARFARIN SODIUM 5 MG PO TABS: 5.0000 mg | ORAL_TABLET | ORAL | Status: DC

## 2012-03-21 MED ORDER — NITROGLYCERIN 0.4 MG SL SUBL: 0.4000 mg | SUBLINGUAL_TABLET | SUBLINGUAL | Status: DC | PRN

## 2012-03-21 MED ORDER — DIAZEPAM 5 MG PO TABS: 5.0000 mg | ORAL_TABLET | Freq: Two times a day (BID) | ORAL | Status: DC

## 2012-03-21 MED ORDER — TERAZOSIN HCL 5 MG PO CAPS: 5.0000 mg | ORAL_CAPSULE | Freq: Every day | ORAL | Status: DC

## 2012-03-21 NOTE — Assessment & Plan Note (Signed)
Stable with no cardiac c/o. No chest pain, DOE or decreased exercise tolerance. He is current with risk stratification testings and risk factors are under good control.

## 2012-03-21 NOTE — Assessment & Plan Note (Signed)
BP Readings from Last 3 Encounters:  03/21/12 118/72  10/23/11 120/70  10/05/11 113/76   Very good control on present medications - no change needed.

## 2012-03-21 NOTE — Assessment & Plan Note (Signed)
Interval history is notable for cataract surgery but otherwise negative for any major illness, injury or other surgery. Physical exam is normal. Lab results previous and current reviewed and are in normal, controlled range. He reports having had colonoscopy - will pull records. Immunizations: tetanus Jan '14 (brought up to tdate); pneumonia vaccine June '10; Shingles - he report having had immunizations.  In summary - a very nice man who has had a complex medical history who appears medically stable at today's exam. He will return in 1 year or sooner as needed.

## 2012-03-21 NOTE — Assessment & Plan Note (Signed)
He has done very well since surgery with no evidence recurrence

## 2012-03-21 NOTE — Progress Notes (Signed)
Subjective:    Patient ID: Bernard [name], male    DOB: September 25, 1932, 79 y.o.   MRN: [id number]  HPI The patient is here for annual Medicare wellness examination and management of other chronic and acute problems. He has had no major illness, surgery or injury in the last 12 months. He does have a follow up appointment with urology, Dr. Margarita Grizzle, tomorrow.    The risk factors are reflected in the social history.  The roster of all physicians providing medical care to patient - is listed in the Snapshot section of the chart.  Activities of daily living:  The patient is 100% inedpendent in all ADLs: dressing, toileting, feeding as well as independent mobility  Home safety : The patient has smoke detectors in the home. Falls - no falls. Home is fall safe. They wear seatbelts. firearms are present in the home, kept in a safe fashion. There is no violence in the home.   There is no risks for hepatitis, STDs or HIV. There is no   history of blood transfusion. They have no travel history to infectious disease endemic areas of the world.  The patient has seen their dentist in the last six month. They have  seen their eye doctor in the last year. They deny any hearing difficulty and have not had audiologic testing in the last year.    They do not  have excessive sun exposure. Discussed the need for sun protection: hats, long sleeves and use of sunscreen if there is significant sun exposure.   Diet: the importance of a healthy diet is discussed. They do have a healthy diet.  The patient has a regular exercise program: stationary bike/walking 1 mile day , 20 min duration, 5 per week.  The benefits of regular aerobic exercise were discussed.  Depression screen: there are no signs or vegative symptoms of depression- irritability, change in appetite, anhedonia, sadness/tearfullness.  Cognitive assessment: the patient manages all their financial and personal affairs and is actively engaged. They could  relate day,date,year and events.  The following portions of the patient's history were reviewed and updated as appropriate: allergies, current medications, past family history, past medical history,  past surgical history, past social history  and problem list.  Past Medical History  Diagnosis Date  . History of BPH     benign prostatic hypertrophy  . Paroxysmal atrial fibrillation     postoperative after lung surgery  . Osteoarthrosis, unspecified whether generalized or localized, lower leg     knee  . Labyrinthitis, unspecified   . Esophageal reflux   . Cerebrovascular disease     s/p prior stroke  . Unspecified essential hypertension   . Other and unspecified hyperlipidemia   . Depressive disorder, not elsewhere classified   . CAD (coronary artery disease)     s/p MI 17s, normal myoview 7/11  . Cancer     lung ca dx'd 2010  . Malignant neoplasm middle lobe, bronchus or lung     right lung. Dx 07/27/08, sp/p resection 7/10.    Past Surgical History  Procedure Date  . Esophagogastroduodenoscopy 05/14/04  . Bunionectomy '89    left foot  . Inguinal hernia repair '71    left  . Anthroscopic surgery '10    right knee \  . Vat-rml lobectomy   . Right tkr 8/11  . Eye surgery     both eyes - 2011 [facility])   Family History  Problem Relation Age of Onset  . Diabetes insipidus  2nd kin   . Coronary artery disease Neg Hx   . Colon cancer Neg Hx   . Prostate cancer Neg Hx    History   Social History  . Marital Status: Married    Spouse Name: N/A    Number of Children: N/A  . Years of Education: N/A   Occupational History  . railroad Science writer     retired   Social History Main Topics  . Smoking status: Never Smoker   . Smokeless tobacco: Never Used  . Alcohol Use: No  . Drug Use: No  . Sexually Active: Yes -- Male partner(s)   Other Topics Concern  . Not on file   Social History Narrative   HSG. Married '70. 1 son '75. Lives with wife independently in  [place]. 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 [employer]. 09/10/09.    Current Outpatient Prescriptions on File Prior to Visit  Medication Sig Dispense Refill  . APAP-Isometheptene-Dichloral 325-65-100 MG per capsule Take 1 capsule by mouth 4 (four) times daily as needed.  30 capsule  3  . Calcium Carbonate (CALTRATE 600) 1500 MG TABS Take 1 tablet by mouth daily.        . cholecalciferol (VITAMIN D) 1000 UNITS tablet Take 1,000 Units by mouth daily.        . diazepam (VALIUM) 5 MG tablet Take 1 tablet (5 mg total) by mouth 2 (two) times daily.  60 tablet  5  . finasteride (PROSCAR) 5 MG tablet TAKE ONE TABLET BY MOUTH EVERY DAY  90 tablet  3  . meclizine (ANTIVERT) 25 MG tablet Take 1 tablet (25 mg total) by mouth every 6 (six) hours as needed. 1/2 or 1 tablet prn for dizziness  90 tablet  1  . nitroGLYCERIN (NITROSTAT) 0.4 MG SL tablet Place 1 tablet (0.4 mg total) under the tongue every 5 (five) minutes as needed.  90 tablet  1  . omeprazole (PRILOSEC) 20 MG capsule Take 1 capsule (20 mg total) by mouth daily. Every other day   90 capsule  1  . simvastatin (ZOCOR) 20 MG tablet TAKE ONE TABLET BY MOUTH EVERY DAY  90 tablet  3  . terazosin (HYTRIN) 5 MG capsule Take 1 capsule (5 mg total) by mouth daily.  90 capsule  1  . warfarin (COUMADIN) 5 MG tablet Take 1 tablet (5 mg total) by mouth as directed.  90 tablet  1     Vision, hearing, body mass index were assessed and reviewed.   During the course of the visit the patient was educated and counseled about appropriate screening and preventive services including : fall prevention , diabetes screening, nutrition counseling, colorectal cancer screening, and recommended immunizations.    Review of Systems Constitutional:  Negative for fever, chills, activity change and unexpected weight change.  HEENT:  Negative for hearing loss, ear pain, congestion, neck stiffness and  postnasal drip. Negative for sore throat or swallowing problems. Negative for dental complaints.   Eyes: Negative for vision loss or change in visual acuity.  Respiratory: Negative for chest tightness and wheezing. Negative for DOE.   Cardiovascular: Negative for chest pain or palpitations. No decreased exercise tolerance Gastrointestinal: No change in bowel habit. No bloating or gas. No reflux or indigestion Genitourinary: Negative for urgency, frequency, flank pain and difficulty urinating.  Musculoskeletal: Negative for myalgias, back pain, arthralgias and gait problem.  Neurological: Negative for dizziness, tremors, weakness and headaches.  Hematological: Negative  for adenopathy.  Psychiatric/Behavioral: Negative for behavioral problems and dysphoric mood.       Objective:   Physical Exam Filed Vitals:   03/21/12 1001  BP: 118/72  Pulse: 63  Temp: 97 F (36.1 C)  Resp: 10   Wt Readings from Last 3 Encounters:  03/21/12 210 lb 1.3 oz (95.292 kg)  10/23/11 206 lb (93.441 kg)  10/05/11 206 lb 4.8 oz (93.577 kg)   Gen'l: Well nourished well developed white  male in no acute distress  HEENT: Head: Normocephalic and atraumatic. Right Ear: External ear normal. EAC/TM nl. Left Ear: External ear normal.  EAC/TM nl. Nose: Nose normal. Mouth/Throat: Oropharynx is clear and moist. Dentition - native, in good repair. No buccal or palatal lesions. Posterior pharynx clear. Eyes: Conjunctivae and sclera clear. EOM intact. Pupils are equal, round, and reactive to light. Right eye exhibits no discharge. Left eye exhibits no discharge. Neck: Normal range of motion. Neck supple. No JVD present. No tracheal deviation present. No thyromegaly present.  Cardiovascular: Normal rate, regular rhythm, no gallop, no friction rub, no murmur heard.      Quiet precordium. 2+ radial and DP pulses . No carotid bruits Pulmonary/Chest: Effort normal. No respiratory distress or increased WOB, no wheezes, no rales.  No chest wall deformity or CVAT. Abdomen: Soft. Bowel sounds are normal in all quadrants. He exhibits no distension, no tenderness, no rebound or guarding, No heptosplenomegaly  Genitourinary:  deferred Musculoskeletal: Normal range of motion. He exhibits no edema and no tenderness.       Small and large joints without redness, synovial thickening or deformity. Full range of motion preserved about all small, median and large joints.  Lymphadenopathy:    He has no cervical or supraclavicular adenopathy.  Neurological: He is alert and oriented to person, place, and time. CN II-XII intact. DTRs 2+ and symmetrical biceps, radial and patellar tendons. Cerebellar function normal with no tremor, rigidity, normal gait and station.  Skin: Skin is warm and dry. No rash noted. No erythema.  Psychiatric: He has a normal mood and affect. His behavior is normal. Thought content normal.   Lab Results  Component Value Date   WBC 6.2 09/29/2011   HGB 13.4 09/29/2011   HCT 40.4 09/29/2011   PLT 128* 09/29/2011   GLUCOSE 96 09/29/2011   CHOL 144 03/21/2012   TRIG 93.0 03/21/2012   HDL 40.40 03/21/2012   LDLCALC 85 03/21/2012   ALT 21 04/15/2011   AST 29 09/29/2011   NA 141 09/29/2011   K 4.4 09/29/2011   CL 103 09/29/2011   CREATININE 1.3* 09/29/2011   BUN 16 09/29/2011   CO2 28 09/29/2011   TSH 2.67 04/22/2009   PSA 2.06 04/22/2009   INR 2.7 03/18/2012   HGBA1C  Value: 6.0 10/23/2009           Assessment & Plan:

## 2012-03-21 NOTE — Patient Instructions (Addendum)
Thanks for coming in.   You are in pretty good condition for the condition you are in.  Lipid panel today. YOu can check results of MyChart and a full report will be mailed.

## 2012-03-21 NOTE — Assessment & Plan Note (Signed)
Has done well since surgery - right TKR.

## 2012-03-21 NOTE — Assessment & Plan Note (Signed)
LDL is close to goal of 80 or less for patient with ASVD/CVA.  Plan Continue present medication.

## 2012-04-21 ENCOUNTER — Encounter: Payer: Self-pay | Admitting: Internal Medicine

## 2012-04-27 ENCOUNTER — Ambulatory Visit (INDEPENDENT_AMBULATORY_CARE_PROVIDER_SITE_OTHER): Payer: MEDICARE | Admitting: General Practice

## 2012-04-27 DIAGNOSIS — C342 Malignant neoplasm of middle lobe, bronchus or lung: Secondary | ICD-10-CM

## 2012-04-27 DIAGNOSIS — G459 Transient cerebral ischemic attack, unspecified: Secondary | ICD-10-CM

## 2012-04-27 DIAGNOSIS — I4891 Unspecified atrial fibrillation: Secondary | ICD-10-CM

## 2012-04-27 LAB — POCT INR: INR: 2.5

## 2012-06-08 ENCOUNTER — Ambulatory Visit (INDEPENDENT_AMBULATORY_CARE_PROVIDER_SITE_OTHER): Payer: MEDICARE | Admitting: General Practice

## 2012-06-08 DIAGNOSIS — I4891 Unspecified atrial fibrillation: Secondary | ICD-10-CM

## 2012-06-08 DIAGNOSIS — G459 Transient cerebral ischemic attack, unspecified: Secondary | ICD-10-CM

## 2012-06-08 DIAGNOSIS — C342 Malignant neoplasm of middle lobe, bronchus or lung: Secondary | ICD-10-CM

## 2012-07-20 ENCOUNTER — Ambulatory Visit (INDEPENDENT_AMBULATORY_CARE_PROVIDER_SITE_OTHER): Payer: MEDICARE | Admitting: General Practice

## 2012-07-20 DIAGNOSIS — G459 Transient cerebral ischemic attack, unspecified: Secondary | ICD-10-CM

## 2012-07-20 DIAGNOSIS — I4891 Unspecified atrial fibrillation: Secondary | ICD-10-CM

## 2012-07-20 DIAGNOSIS — C342 Malignant neoplasm of middle lobe, bronchus or lung: Secondary | ICD-10-CM

## 2012-08-31 ENCOUNTER — Ambulatory Visit (INDEPENDENT_AMBULATORY_CARE_PROVIDER_SITE_OTHER): Payer: MEDICARE | Admitting: General Practice

## 2012-08-31 DIAGNOSIS — G459 Transient cerebral ischemic attack, unspecified: Secondary | ICD-10-CM

## 2012-08-31 DIAGNOSIS — C342 Malignant neoplasm of middle lobe, bronchus or lung: Secondary | ICD-10-CM

## 2012-08-31 DIAGNOSIS — I4891 Unspecified atrial fibrillation: Secondary | ICD-10-CM

## 2012-09-21 ENCOUNTER — Ambulatory Visit (INDEPENDENT_AMBULATORY_CARE_PROVIDER_SITE_OTHER): Payer: MEDICARE | Admitting: General Practice

## 2012-09-21 DIAGNOSIS — C342 Malignant neoplasm of middle lobe, bronchus or lung: Secondary | ICD-10-CM

## 2012-09-21 DIAGNOSIS — G459 Transient cerebral ischemic attack, unspecified: Secondary | ICD-10-CM

## 2012-09-21 DIAGNOSIS — I4891 Unspecified atrial fibrillation: Secondary | ICD-10-CM

## 2012-10-03 ENCOUNTER — Ambulatory Visit (HOSPITAL_COMMUNITY)
Admission: RE | Admit: 2012-10-03 | Discharge: 2012-10-03 | Disposition: A | Discharge status: Home / Self Care | Payer: MEDICARE | Source: Ambulatory Visit | Attending: Internal Medicine | Admitting: Internal Medicine

## 2012-10-03 ENCOUNTER — Other Ambulatory Visit (HOSPITAL_BASED_OUTPATIENT_CLINIC_OR_DEPARTMENT_OTHER): Payer: MEDICARE | Admitting: Lab

## 2012-10-03 ENCOUNTER — Encounter (HOSPITAL_COMMUNITY): Payer: Self-pay

## 2012-10-03 DIAGNOSIS — J8410 Pulmonary fibrosis, unspecified: Secondary | ICD-10-CM | POA: Insufficient documentation

## 2012-10-03 DIAGNOSIS — I2510 Atherosclerotic heart disease of native coronary artery without angina pectoris: Secondary | ICD-10-CM | POA: Insufficient documentation

## 2012-10-03 DIAGNOSIS — D7389 Other diseases of spleen: Secondary | ICD-10-CM | POA: Insufficient documentation

## 2012-10-03 DIAGNOSIS — C342 Malignant neoplasm of middle lobe, bronchus or lung: Secondary | ICD-10-CM

## 2012-10-03 DIAGNOSIS — N289 Disorder of kidney and ureter, unspecified: Secondary | ICD-10-CM | POA: Insufficient documentation

## 2012-10-03 LAB — CBC WITH DIFFERENTIAL/PLATELET
BASO%: 0.3 % (ref 0.0–2.0)
Basophils Absolute: 0 10*3/uL (ref 0.0–0.1)
Eosinophils Absolute: 0.1 10*3/uL (ref 0.0–0.5)
HCT: 41.3 % (ref 38.4–49.9)
HGB: 13.8 g/dL (ref 13.0–17.1)
LYMPH%: 34.7 % (ref 14.0–49.0)
MCHC: 33.4 g/dL (ref 32.0–36.0)
MONO#: 0.5 10*3/uL (ref 0.1–0.9)
NEUT#: 3.8 10*3/uL (ref 1.5–6.5)
NEUT%: 56.4 % (ref 39.0–75.0)
Platelets: 122 10*3/uL — ABNORMAL LOW (ref 140–400)
WBC: 6.7 10*3/uL (ref 4.0–10.3)
lymph#: 2.3 10*3/uL (ref 0.9–3.3)

## 2012-10-03 LAB — COMPREHENSIVE METABOLIC PANEL (CC13)
AST: 17 U/L (ref 5–34)
Albumin: 3.6 g/dL (ref 3.5–5.0)
BUN: 14.9 mg/dL (ref 7.0–26.0)
CO2: 24 mEq/L (ref 22–29)
Calcium: 8.7 mg/dL (ref 8.4–10.4)
Chloride: 110 mEq/L — ABNORMAL HIGH (ref 98–109)
Creatinine: 1 mg/dL (ref 0.7–1.3)
Glucose: 101 mg/dl (ref 70–140)
Potassium: 4.2 mEq/L (ref 3.5–5.1)

## 2012-10-05 ENCOUNTER — Ambulatory Visit (INDEPENDENT_AMBULATORY_CARE_PROVIDER_SITE_OTHER): Payer: MEDICARE | Admitting: Internal Medicine

## 2012-10-05 ENCOUNTER — Encounter: Payer: Self-pay | Admitting: Internal Medicine

## 2012-10-05 VITALS — BP 115/67 | HR 57 | Ht 70.0 in | Wt 204.0 lb

## 2012-10-05 DIAGNOSIS — I2510 Atherosclerotic heart disease of native coronary artery without angina pectoris: Secondary | ICD-10-CM

## 2012-10-05 DIAGNOSIS — E785 Hyperlipidemia, unspecified: Secondary | ICD-10-CM

## 2012-10-05 DIAGNOSIS — I4891 Unspecified atrial fibrillation: Secondary | ICD-10-CM

## 2012-10-05 DIAGNOSIS — I10 Essential (primary) hypertension: Secondary | ICD-10-CM

## 2012-10-05 NOTE — Patient Instructions (Addendum)
Your physician wants you to follow-up in: 12 months with Brooke Edmisten, PA  You will receive a reminder letter in the mail two months in advance. If you don't receive a letter, please call our office to schedule the follow-up appointment.     

## 2012-10-05 NOTE — Progress Notes (Signed)
PCP: Illene Regulus, MD  The patient presents today for routine electrophysiology followup.  Since last being seen in our clinic, the patient reports doing very well.  He is unaware of any further afib.  He reports postural dizziness at times which is unchanged from last visit.  Today, he denies symptoms of palpitations, chest pain, shortness of breath, orthopnea, PND, lower extremity edema, presyncope, syncope, or neurologic sequela.  The patient feels that he is tolerating medications without difficulties and is otherwise without complaint today.   Past Medical History  Diagnosis Date  . History of BPH     benign prostatic hypertrophy  . Paroxysmal atrial fibrillation     postoperative after lung surgery  . Osteoarthrosis, unspecified whether generalized or localized, lower leg     knee  . Labyrinthitis, unspecified   . Esophageal reflux   . Cerebrovascular disease     s/p prior stroke  . Unspecified essential hypertension   . Other and unspecified hyperlipidemia   . Depressive disorder, not elsewhere classified   . CAD (coronary artery disease)     s/p MI 26s, normal myoview 7/11  . Cancer     lung ca dx'd 2010  . Malignant neoplasm middle lobe, bronchus or lung     right lung. Dx 07/27/08, sp/p resection 7/10.    Past Surgical History  Procedure Laterality Date  . Esophagogastroduodenoscopy  05/14/04  . Bunionectomy  '89    left foot  . Inguinal hernia repair  '71    left  . Anthroscopic surgery  '10    right knee \  . Vat-rml lobectomy    . Right tkr  8/11  . Eye surgery      both eyes - 2011 [facility])    Current Outpatient Prescriptions  Medication Sig Dispense Refill  . APAP-Isometheptene-Dichloral 325-65-100 MG per capsule Take 1 capsule by mouth 4 (four) times daily as needed.  30 capsule  3  . Calcium Carbonate (CALTRATE 600) 1500 MG TABS Take 1 tablet by mouth daily.        . cholecalciferol (VITAMIN D) 1000 UNITS tablet Take 1,000 Units by mouth daily.        .  diazepam (VALIUM) 5 MG tablet Take 1 tablet (5 mg total) by mouth 2 (two) times daily.  180 tablet  3  . finasteride (PROSCAR) 5 MG tablet TAKE ONE TABLET BY MOUTH EVERY DAY  90 tablet  3  . meclizine (ANTIVERT) 25 MG tablet Take 1 tablet (25 mg total) by mouth every 6 (six) hours as needed. 1/2 or 1 tablet prn for dizziness  90 tablet  1  . multivitamin-lutein (OCUVITE-LUTEIN) CAPS Take 1 capsule by mouth daily.      . nitroGLYCERIN (NITROSTAT) 0.4 MG SL tablet Place 1 tablet (0.4 mg total) under the tongue every 5 (five) minutes as needed.  25 tablet  3  . omeprazole (PRILOSEC) 20 MG capsule Take 1 capsule (20 mg total) by mouth daily. Every other day   90 capsule  1  . simvastatin (ZOCOR) 20 MG tablet TAKE ONE TABLET BY MOUTH EVERY DAY  90 tablet  3  . terazosin (HYTRIN) 5 MG capsule Take 5 mg by mouth every other day.      . warfarin (COUMADIN) 5 MG tablet Take 1 tablet (5 mg total) by mouth as directed.  90 tablet  3   No current facility-administered medications for this visit.    No Known Allergies  History   Social History  .  Marital Status: Married    Spouse Name: N/A    Number of Children: N/A  . Years of Education: N/A   Occupational History  . railroad Science writer     retired   Social History Main Topics  . Smoking status: Never Smoker   . Smokeless tobacco: Never Used  . Alcohol Use: No  . Drug Use: No  . Sexual Activity: Yes    Partners: Female   Other Topics Concern  . Not on file   Social History Narrative   HSG. Married '70. 1 son '75. Lives with wife independently in [place]. 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 [employer]. 09/10/09.     Family History  Problem Relation Age of Onset  . Diabetes insipidus      2nd kin   . Coronary artery disease Neg Hx   . Colon cancer Neg Hx   . Prostate cancer Neg Hx    Physical Exam: Filed Vitals:   10/05/12 1058  BP: 115/67  Pulse: 57   Height: 5\' 10"  (1.778 m)  Weight: 204 lb (92.534 kg)    GEN- The patient is well appearing, alert and oriented x 3 today.   Head- normocephalic, atraumatic Eyes-  Sclera clear, conjunctiva pink Ears- hearing intact Oropharynx- clear Neck- supple, no JVP Lymph- no cervical lymphadenopathy Lungs- Clear to ausculation bilaterally, normal work of breathing Heart- Regular rate and rhythm, no murmurs, rubs or gallops, PMI not laterally displaced GI- soft, NT, ND, + BS Extremities- no clubbing, cyanosis, or edema MS- no significant deformity or atrophy Skin- no rash or lesion Psych- euthymic mood, full affect Neuro- strength and sensation are intact  ekg today reveals sinus rhythm 57 bpm, otherwise normal ekg  Assessment and Plan:    1. afib Well controlled Appropriate anticoagulated  2. HTN Stable No change required today  3. HL Stable No change required today  4. CAD Asymptomatic No changes  Return to see Nehemiah Settle in 1 year

## 2012-10-06 ENCOUNTER — Ambulatory Visit (HOSPITAL_BASED_OUTPATIENT_CLINIC_OR_DEPARTMENT_OTHER): Payer: MEDICARE | Admitting: Internal Medicine

## 2012-10-06 ENCOUNTER — Telehealth: Payer: Self-pay | Admitting: Internal Medicine

## 2012-10-06 VITALS — BP 130/62 | HR 62 | Temp 98.2°F | Resp 18 | Ht 70.0 in | Wt 204.4 lb

## 2012-10-06 DIAGNOSIS — C342 Malignant neoplasm of middle lobe, bronchus or lung: Secondary | ICD-10-CM

## 2012-10-06 NOTE — Progress Notes (Signed)
[facility] [phone]   [phone]  OFFICE PROGRESS NOTE  Illene Regulus, MD [address] [place] [address]  PRINCIPAL DIAGNOSIS: Stage IA non-small-cell lung cancer (T1a N0 M0) adenocarcinoma diagnosed in June 2010.   PRIOR THERAPY: Status post right middle lobectomy under the care of Dr. [name] on August 28, 2008.   CURRENT THERAPY: Observation.  INTERVAL HISTORY: Bernard [name] 79 y.o. male returns to the clinic today for annual follow up visit accompanied by his wife. The patient is feeling fine today with no specific complaints. He denied having any significant chest pain, shortness breath, cough or hemoptysis.he has no weight loss or night sweats. The patient has repeat CT scan of the chest performed recently and he is here for evaluation and discussion of his scan results.  MEDICAL HISTORY: Past Medical History  Diagnosis Date  . History of BPH     benign prostatic hypertrophy  . Paroxysmal atrial fibrillation     postoperative after lung surgery  . Osteoarthrosis, unspecified whether generalized or localized, lower leg     knee  . Labyrinthitis, unspecified   . Esophageal reflux   . Cerebrovascular disease     s/p prior stroke  . Unspecified essential hypertension   . Other and unspecified hyperlipidemia   . Depressive disorder, not elsewhere classified   . CAD (coronary artery disease)     s/p MI 26s, normal myoview 7/11  . Cancer     lung ca dx'd 2010  . Malignant neoplasm middle lobe, bronchus or lung     right lung. Dx 07/27/08, sp/p resection 7/10.     ALLERGIES:  has No Known Allergies.  MEDICATIONS:  Current Outpatient Prescriptions  Medication Sig Dispense Refill  . APAP-Isometheptene-Dichloral 325-65-100 MG per capsule Take 1 capsule by mouth 4 (four) times daily as needed.  30 capsule  3  . Calcium Carbonate (CALTRATE 600) 1500 MG TABS Take 1 tablet by mouth daily.        . cholecalciferol (VITAMIN D)  1000 UNITS tablet Take 1,000 Units by mouth daily.        . diazepam (VALIUM) 5 MG tablet Take 1 tablet (5 mg total) by mouth 2 (two) times daily.  180 tablet  3  . finasteride (PROSCAR) 5 MG tablet TAKE ONE TABLET BY MOUTH EVERY DAY  90 tablet  3  . meclizine (ANTIVERT) 25 MG tablet Take 1 tablet (25 mg total) by mouth every 6 (six) hours as needed. 1/2 or 1 tablet prn for dizziness  90 tablet  1  . multivitamin-lutein (OCUVITE-LUTEIN) CAPS Take 1 capsule by mouth daily.      . nitroGLYCERIN (NITROSTAT) 0.4 MG SL tablet Place 1 tablet (0.4 mg total) under the tongue every 5 (five) minutes as needed.  25 tablet  3  . omeprazole (PRILOSEC) 20 MG capsule Take 1 capsule (20 mg total) by mouth daily. Every other day   90 capsule  1  . simvastatin (ZOCOR) 20 MG tablet TAKE ONE TABLET BY MOUTH EVERY DAY  90 tablet  3  . terazosin (HYTRIN) 5 MG capsule Take 5 mg by mouth every other day.      . warfarin (COUMADIN) 5 MG tablet Take 1 tablet (5 mg total) by mouth as directed.  90 tablet  3   No current facility-administered medications for this visit.    SURGICAL HISTORY:  Past Surgical History  Procedure Laterality Date  . Esophagogastroduodenoscopy  05/14/04  .  Bunionectomy  '89    left foot  . Inguinal hernia repair  '71    left  . Anthroscopic surgery  '10    right knee \  . Vat-rml lobectomy    . Right tkr  8/11  . Eye surgery      both eyes - 2011 [facility])    REVIEW OF SYSTEMS:  A comprehensive review of systems was negative.   PHYSICAL EXAMINATION: General appearance: alert, cooperative and no distress Head: Normocephalic, without obvious abnormality, atraumatic Neck: no adenopathy Lymph nodes: Cervical, supraclavicular, and axillary nodes normal. Resp: clear to auscultation bilaterally Cardio: regular rate and rhythm, S1, S2 normal, no murmur, click, rub or gallop GI: soft, non-tender; bowel sounds normal; no masses,  no organomegaly Extremities: extremities normal, atraumatic,  no cyanosis or edema  ECOG PERFORMANCE STATUS: 1 - Symptomatic but completely ambulatory  Blood pressure 130/62, pulse 62, temperature 98.2 F (36.8 C), temperature source Oral, resp. rate 18, height 5\' 10"  (1.778 m), weight 204 lb 6.4 oz (92.715 kg), SpO2 97.00%.  LABORATORY DATA: Lab Results  Component Value Date   WBC 6.7 10/03/2012   HGB 13.8 10/03/2012   HCT 41.3 10/03/2012   MCV 90.4 10/03/2012   PLT 122* 10/03/2012      Chemistry      Component Value Date/Time   NA 141 10/03/2012 0903   NA 141 09/29/2011 1218   NA 140 03/06/2010 1206   K 4.2 10/03/2012 0903   K 4.4 09/29/2011 1218   K 4.4 03/06/2010 1206   CL 103 09/29/2011 1218   CL 106 03/06/2010 1206   CO2 24 10/03/2012 0903   CO2 28 09/29/2011 1218   CO2 28 03/06/2010 1206   BUN 14.9 10/03/2012 0903   BUN 16 09/29/2011 1218   BUN 14 03/06/2010 1206   CREATININE 1.0 10/03/2012 0903   CREATININE 1.3* 09/29/2011 1218   CREATININE 1.32 01/26/2011 1627      Component Value Date/Time   CALCIUM 8.7 10/03/2012 0903   CALCIUM 8.8 09/29/2011 1218   CALCIUM 9.2 03/06/2010 1206   ALKPHOS 36* 10/03/2012 0903   ALKPHOS 38 09/29/2011 1218   ALKPHOS 36* 04/15/2011 1020   AST 17 10/03/2012 0903   AST 29 09/29/2011 1218   AST 22 04/15/2011 1020   ALT 13 10/03/2012 0903   ALT 27 09/29/2011 1218   ALT 21 04/15/2011 1020   BILITOT 0.40 10/03/2012 0903   BILITOT 1.00 09/29/2011 1218   BILITOT 0.4 04/15/2011 1020       RADIOGRAPHIC STUDIES: Ct Chest Wo Contrast  10/03/2012   *RADIOLOGY REPORT*  Clinical Data: Lung cancer.  CT CHEST WITHOUT CONTRAST  Technique:  Multidetector CT imaging of the chest was performed following the standard protocol without IV contrast.  Comparison: 09/29/2011.  Findings: No pathologically enlarged mediastinal, hilar or axillary lymph nodes.  Coronary artery calcification.  Heart size normal. No pericardial effusion.  Postoperative changes of right middle lobectomy, stable.  Calcified granuloma in the right upper lobe.  Lungs are  otherwise clear.  No pleural fluid.  Airway is unremarkable.  Incidental imaging of the upper abdomen shows no acute findings. An 8.0 cm low attenuation lesion off the upper pole right kidney is again seen but incompletely imaged.  Scattered calcification along the periphery of the spleen.  No worrisome lytic or sclerotic lesions.  Degenerative changes are seen in the spine.  IMPRESSION:  Postoperative changes of the right middle lobectomy without evidence of recurrent or metastatic disease.  Original Report Authenticated By: Leanna Battles, M.D.    ASSESSMENT AND PLAN: this is a very pleasant 79 years old white male with a stage IA non-small cell lung cancer status post resection and has been observation since 2000 and with no evidence for disease recurrence. I discussed the scan results with the patient and his wife.   I recommended for him to continue on observation with repeat CT scan of the chest without contrast in one year. He was advised to call immediately if he has any concerning symptoms in the interval.  The patient voices understanding of current disease status and treatment options and is in agreement with the current care plan.  All questions were answered. The patient knows to call the clinic with any problems, questions or concerns. We can certainly see the patient much sooner if necessary.

## 2012-10-06 NOTE — Telephone Encounter (Signed)
GV AND PRINTED APPT SCHED AND AVS FORPT... °

## 2012-10-08 ENCOUNTER — Encounter: Payer: Self-pay | Admitting: Internal Medicine

## 2012-10-19 ENCOUNTER — Ambulatory Visit (INDEPENDENT_AMBULATORY_CARE_PROVIDER_SITE_OTHER): Payer: MEDICARE | Admitting: General Practice

## 2012-10-19 DIAGNOSIS — C342 Malignant neoplasm of middle lobe, bronchus or lung: Secondary | ICD-10-CM

## 2012-10-19 DIAGNOSIS — I4891 Unspecified atrial fibrillation: Secondary | ICD-10-CM

## 2012-10-19 DIAGNOSIS — G459 Transient cerebral ischemic attack, unspecified: Secondary | ICD-10-CM

## 2012-10-19 LAB — POCT INR: INR: 2.7

## 2012-11-16 ENCOUNTER — Ambulatory Visit (INDEPENDENT_AMBULATORY_CARE_PROVIDER_SITE_OTHER): Payer: MEDICARE | Admitting: General Practice

## 2012-11-16 DIAGNOSIS — Z23 Encounter for immunization: Secondary | ICD-10-CM

## 2012-11-16 DIAGNOSIS — C342 Malignant neoplasm of middle lobe, bronchus or lung: Secondary | ICD-10-CM

## 2012-11-16 DIAGNOSIS — G459 Transient cerebral ischemic attack, unspecified: Secondary | ICD-10-CM

## 2012-11-16 DIAGNOSIS — I4891 Unspecified atrial fibrillation: Secondary | ICD-10-CM

## 2012-12-02 ENCOUNTER — Other Ambulatory Visit: Payer: Self-pay | Admitting: Internal Medicine

## 2012-12-02 ENCOUNTER — Other Ambulatory Visit: Payer: Self-pay

## 2012-12-02 MED ORDER — SIMVASTATIN 20 MG PO TABS: 20.0000 mg | ORAL_TABLET | Freq: Every day | ORAL | Status: DC

## 2012-12-16 ENCOUNTER — Ambulatory Visit (INDEPENDENT_AMBULATORY_CARE_PROVIDER_SITE_OTHER): Payer: MEDICARE | Admitting: Family Medicine

## 2012-12-16 DIAGNOSIS — I4891 Unspecified atrial fibrillation: Secondary | ICD-10-CM

## 2012-12-16 DIAGNOSIS — G459 Transient cerebral ischemic attack, unspecified: Secondary | ICD-10-CM

## 2012-12-16 DIAGNOSIS — C342 Malignant neoplasm of middle lobe, bronchus or lung: Secondary | ICD-10-CM

## 2012-12-16 LAB — POCT INR: INR: 3

## 2013-01-13 ENCOUNTER — Ambulatory Visit (INDEPENDENT_AMBULATORY_CARE_PROVIDER_SITE_OTHER): Payer: MEDICARE | Admitting: General Practice

## 2013-01-13 DIAGNOSIS — C342 Malignant neoplasm of middle lobe, bronchus or lung: Secondary | ICD-10-CM

## 2013-01-13 DIAGNOSIS — I4891 Unspecified atrial fibrillation: Secondary | ICD-10-CM

## 2013-01-13 DIAGNOSIS — G459 Transient cerebral ischemic attack, unspecified: Secondary | ICD-10-CM

## 2013-01-13 LAB — POCT INR: INR: 2.3

## 2013-01-13 NOTE — Progress Notes (Signed)
Pre-visit discussion using our clinic review tool. No additional management support is needed unless otherwise documented below in the visit note.  

## 2013-01-27 ENCOUNTER — Other Ambulatory Visit: Payer: Self-pay

## 2013-01-27 ENCOUNTER — Other Ambulatory Visit: Payer: Self-pay | Admitting: Internal Medicine

## 2013-01-27 MED ORDER — DIAZEPAM 5 MG PO TABS: 5.0000 mg | ORAL_TABLET | Freq: Two times a day (BID) | ORAL | Status: DC

## 2013-01-27 NOTE — Telephone Encounter (Addendum)
Diazepam faxed to [facility] [phone]

## 2013-02-10 ENCOUNTER — Ambulatory Visit (INDEPENDENT_AMBULATORY_CARE_PROVIDER_SITE_OTHER): Payer: MEDICARE | Admitting: General Practice

## 2013-02-10 DIAGNOSIS — C342 Malignant neoplasm of middle lobe, bronchus or lung: Secondary | ICD-10-CM

## 2013-02-10 DIAGNOSIS — G459 Transient cerebral ischemic attack, unspecified: Secondary | ICD-10-CM

## 2013-02-10 DIAGNOSIS — I4891 Unspecified atrial fibrillation: Secondary | ICD-10-CM

## 2013-02-10 LAB — POCT INR: INR: 3

## 2013-02-10 NOTE — Progress Notes (Signed)
Pre-visit discussion using our clinic review tool. No additional management support is needed unless otherwise documented below in the visit note.  

## 2013-02-14 ENCOUNTER — Other Ambulatory Visit: Payer: Self-pay | Admitting: General Practice

## 2013-02-14 MED ORDER — WARFARIN SODIUM 5 MG PO TABS: ORAL_TABLET | ORAL | Status: DC

## 2013-03-22 ENCOUNTER — Other Ambulatory Visit (INDEPENDENT_AMBULATORY_CARE_PROVIDER_SITE_OTHER): Payer: MEDICARE

## 2013-03-22 ENCOUNTER — Encounter: Payer: Self-pay | Admitting: Internal Medicine

## 2013-03-22 ENCOUNTER — Ambulatory Visit (INDEPENDENT_AMBULATORY_CARE_PROVIDER_SITE_OTHER): Payer: MEDICARE | Admitting: Internal Medicine

## 2013-03-22 VITALS — BP 120/80 | HR 60 | Temp 98.0°F | Ht 70.0 in | Wt 206.4 lb

## 2013-03-22 DIAGNOSIS — E785 Hyperlipidemia, unspecified: Secondary | ICD-10-CM

## 2013-03-22 DIAGNOSIS — I10 Essential (primary) hypertension: Secondary | ICD-10-CM

## 2013-03-22 DIAGNOSIS — D1771 Benign lipomatous neoplasm of kidney: Secondary | ICD-10-CM

## 2013-03-22 DIAGNOSIS — D3001 Benign neoplasm of right kidney: Secondary | ICD-10-CM

## 2013-03-22 DIAGNOSIS — K219 Gastro-esophageal reflux disease without esophagitis: Secondary | ICD-10-CM

## 2013-03-22 DIAGNOSIS — Z23 Encounter for immunization: Secondary | ICD-10-CM

## 2013-03-22 DIAGNOSIS — I2510 Atherosclerotic heart disease of native coronary artery without angina pectoris: Secondary | ICD-10-CM

## 2013-03-22 DIAGNOSIS — C342 Malignant neoplasm of middle lobe, bronchus or lung: Secondary | ICD-10-CM

## 2013-03-22 DIAGNOSIS — Z0000 Encounter for general adult medical examination without abnormal findings: Secondary | ICD-10-CM

## 2013-03-22 DIAGNOSIS — I679 Cerebrovascular disease, unspecified: Secondary | ICD-10-CM

## 2013-03-22 DIAGNOSIS — D3000 Benign neoplasm of unspecified kidney: Secondary | ICD-10-CM

## 2013-03-22 LAB — COMPREHENSIVE METABOLIC PANEL
ALT: 17 U/L (ref 0–53)
AST: 23 U/L (ref 0–37)
Albumin: 4.1 g/dL (ref 3.5–5.2)
Alkaline Phosphatase: 38 U/L — ABNORMAL LOW (ref 39–117)
BUN: 17 mg/dL (ref 6–23)
CALCIUM: 9.2 mg/dL (ref 8.4–10.5)
CHLORIDE: 106 meq/L (ref 96–112)
CO2: 30 mEq/L (ref 19–32)
Creatinine, Ser: 1.1 mg/dL (ref 0.4–1.5)
GFR: 68.06 mL/min (ref >60.00)
GLUCOSE: 105 mg/dL — AB (ref 70–99)
Potassium: 4.1 mEq/L (ref 3.5–5.1)
Sodium: 141 mEq/L (ref 135–145)
Total Bilirubin: 0.8 mg/dL (ref 0.3–1.2)
Total Protein: 7.2 g/dL (ref 6.0–8.3)

## 2013-03-22 LAB — LIPID PANEL
CHOLESTEROL: 138 mg/dL (ref 0–200)
HDL: 42.6 mg/dL (ref >39.00)
LDL CALC: 76 mg/dL (ref 0–99)
Total CHOL/HDL Ratio: 3
Triglycerides: 95 mg/dL (ref 0.0–149.0)
VLDL: 19 mg/dL (ref 0.0–40.0)

## 2013-03-22 LAB — HEMOGLOBIN AND HEMATOCRIT, BLOOD
HEMATOCRIT: 44.3 % (ref 39.0–52.0)
Hemoglobin: 14.4 g/dL (ref 13.0–17.0)

## 2013-03-22 NOTE — Progress Notes (Signed)
Subjective:    Patient ID: Bernard [name], male    DOB: 02-24-1932, 80 y.o.   MRN: [id number]  HPI The patient is here for annual Medicare wellness examination and management of other chronic and acute problems.   Reviewed his urologic history with both prostate eval including biopsy - negative . He also was worked up for microscopic hematuria including cystoscopy that was negative.  He is current with seeing Dr. Julien Nordmann for follow up for lung cancer.  The risk factors are reflected in the social history.  The roster of all physicians providing medical care to patient - is listed in the Snapshot section of the chart.  Activities of daily living:  The patient is 100% inedpendent in all ADLs: dressing, toileting, feeding as well as independent mobility  Home safety : The patient has smoke detectors in the home. Falls no. They wear seatbelts.  firearms are present in the home, kept in a safe fashion. There is no violence in the home.   There is no risks for hepatitis, STDs or HIV. There is no   history of blood transfusion. They have no travel history to infectious disease endemic areas of the world.  The patient has seen their dentist in the last six month. They have seen their eye doctor in the last year. They deny any hearing difficulty and have not had audiologic testing in the last year.    They do not  have excessive sun exposure. Discussed the need for sun protection: hats, long sleeves and use of sunscreen if there is significant sun exposure.   Diet: the importance of a healthy diet is discussed. They do have a healthy diet.  The patient has a regular exercise program: walking , 30 min duration, 5 per week.  The benefits of regular aerobic exercise were discussed.  Depression screen: there are no signs or vegative symptoms of depression- irritability, change in appetite, anhedonia, sadness/tearfullness.  Cognitive assessment: the patient manages all their financial and  personal affairs and is actively engaged. 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  The following portions of the patient's history were reviewed and updated as appropriate: allergies, current medications, past family history, past medical history,  past surgical history, past social history  and problem list.  Vision, hearing, body mass index were assessed and reviewed.   During the course of the visit the patient was educated and counseled about appropriate screening and preventive services including : fall prevention , diabetes screening, nutrition counseling, colorectal cancer screening, and recommended immunizations.  Past Medical History  Diagnosis Date  . History of BPH     benign prostatic hypertrophy  . Paroxysmal atrial fibrillation     postoperative after lung surgery  . Osteoarthrosis, unspecified whether generalized or localized, lower leg     knee  . Labyrinthitis, unspecified   . Esophageal reflux   . Cerebrovascular disease     s/p prior stroke  . Unspecified essential hypertension   . Other and unspecified hyperlipidemia   . Depressive disorder, not elsewhere classified   . CAD (coronary artery disease)     s/p MI 75s, normal myoview 7/11  . Cancer     lung ca dx'd 2010  . Malignant neoplasm middle lobe, bronchus or lung     right lung. Dx 07/27/08, sp/p resection 7/10.    Past Surgical History  Procedure Laterality Date  . Esophagogastroduodenoscopy  05/14/04  . Bunionectomy  '89    left foot  . Inguinal  hernia repair  '71    left  . Anthroscopic surgery  '10    right knee \  . Vat-rml lobectomy    . Right tkr  8/11  . Eye surgery      both eyes - 2011 [facility])   Family History  Problem Relation Age of Onset  . Diabetes insipidus      2nd kin   . Coronary artery disease Neg Hx   . Colon cancer Neg Hx   . Prostate cancer Neg Hx    History   Social History  . Marital Status: Married    Spouse Name: N/A    Number of Children: N/A  . Years  of Education: N/A   Occupational History  . railroad Counsellor     retired   Social History Main Topics  . Smoking status: Never Smoker   . Smokeless tobacco: Never Used  . Alcohol Use: No  . Drug Use: No  . Sexual Activity: Yes    Partners: Female   Other Topics Concern  . Not on file   Social History Narrative   HSG. Married '70. 1 son '75. Lives with wife independently in [place]. 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 Bernard [name]. 09/10/09.     Current Outpatient Prescriptions on File Prior to Visit  Medication Sig Dispense Refill  . APAP-Isometheptene-Dichloral 325-65-100 MG per capsule Take 1 capsule by mouth 4 (four) times daily as needed.  30 capsule  3  . Calcium Carbonate (CALTRATE 600) 1500 MG TABS Take 1 tablet by mouth daily.        . cholecalciferol (VITAMIN D) 1000 UNITS tablet Take 1,000 Units by mouth daily.        . diazepam (VALIUM) 5 MG tablet Take 1 tablet (5 mg total) by mouth 2 (two) times daily.  180 tablet  3  . finasteride (PROSCAR) 5 MG tablet TAKE 1 TABLET EVERY DAY  90 tablet  3  . meclizine (ANTIVERT) 25 MG tablet Take 1 tablet (25 mg total) by mouth every 6 (six) hours as needed. 1/2 or 1 tablet prn for dizziness  90 tablet  1  . multivitamin-lutein (OCUVITE-LUTEIN) CAPS Take 1 capsule by mouth daily.      . nitroGLYCERIN (NITROSTAT) 0.4 MG SL tablet Place 1 tablet (0.4 mg total) under the tongue every 5 (five) minutes as needed.  25 tablet  3  . omeprazole (PRILOSEC) 20 MG capsule Take 1 capsule (20 mg total) by mouth daily. Every other day   90 capsule  1  . simvastatin (ZOCOR) 20 MG tablet Take 1 tablet (20 mg total) by mouth daily.  90 tablet  3  . terazosin (HYTRIN) 5 MG capsule TAKE 1 CAPSULE EVERY DAY  90 capsule  3  . warfarin (COUMADIN) 5 MG tablet Take as directed by anticoagulation clinic  90 tablet  1   No current facility-administered medications on file prior to visit.      Review of Systems Constitutional:  Negative for fever, chills, activity change and unexpected weight change.  HEENT:  Negative for hearing loss, ear pain, congestion, neck stiffness and postnasal drip. Negative for sore throat or swallowing problems. Negative for dental complaints.   Eyes: Negative for vision loss or change in visual acuity.  Respiratory: Negative for chest tightness and wheezing. Negative for DOE.   Cardiovascular: Negative for chest pain or palpitations. No decreased exercise tolerance Gastrointestinal: No change in bowel habit. No bloating  or gas. No reflux or indigestion Genitourinary: Negative for urgency, frequency, flank pain and difficulty urinating. Will have intermittent pain in the rectal area - discomfort more than pain.  Musculoskeletal: Negative for myalgias, back pain, arthralgias and gait problem.  Neurological: Negative for dizziness, tremors, weakness and headaches.  Hematological: Negative for adenopathy.  Psychiatric/Behavioral: Negative for behavioral problems and dysphoric mood.       Objective:   Physical Exam Filed Vitals:   03/22/13 1017  BP: 120/80  Pulse: 60  Temp: 98 F (36.7 C)   Wt Readings from Last 3 Encounters:  03/22/13 206 lb 6.4 oz (93.622 kg)  10/06/12 204 lb 6.4 oz (92.715 kg)  10/05/12 204 lb (92.534 kg)   Gen'l: Well nourished well developed male in no acute distress  HEENT: Head: Normocephalic and atraumatic. Right Ear: External ear normal. EAC/TM nl. Left Ear: External ear normal.  EAC/TM nl. Nose: Nose normal. Mouth/Throat: Oropharynx is clear and moist. Dentition - native, in good repair. No buccal or palatal lesions. Posterior pharynx clear. Eyes: Conjunctivae and sclera clear. EOM intact. Pupils are equal, round, and reactive to light. Right eye exhibits no discharge. Left eye exhibits no discharge. Neck: Normal range of motion. Neck supple. No JVD present. No tracheal deviation present. No thyromegaly present.    Cardiovascular: Normal rate, regular rhythm, no gallop, no friction rub, no murmur heard.      Quiet precordium. 2+ radial and DP pulses . No carotid bruits Pulmonary/Chest: Effort normal. No respiratory distress or increased WOB, no wheezes, no rales. No chest wall deformity or CVAT. Abdomen: Soft. Bowel sounds are normal in all quadrants. He exhibits distension right at the level of the umbilicus, no tenderness, no rebound or guarding, No heptosplenomegaly . Well healed hernia repair scar left groin Genitourinary:   Musculoskeletal: Normal range of motion. He exhibits no edema and no tenderness.       Small and large joints without redness, synovial thickening or deformity. Full range of motion preserved about all small, median and large joints.  Lymphadenopathy:    He has no cervical or supraclavicular adenopathy.  Neurological: He is alert and oriented to person, place, and time. CN II-XII intact. DTRs 2+ and symmetrical biceps, radial and patellar tendons. Cerebellar function normal with no tremor, rigidity, normal gait and station. Mental status and memory not tested but there is a history of cognitive slowing with mild impairment of function which is a residual effect of CVA from years ago. Skin: Skin is warm and dry. No rash noted. No erythema.  Psychiatric: He has a normal mood and affect. His behavior is normal. Thought content normal.   Recent Results (from the past 2160 hour(s))  POCT INR     Status: None   Collection Time    01/13/13 10:45 AM      Result Value Range   INR 2.3    POCT INR     Status: None   Collection Time    02/10/13 11:00 AM      Result Value Range   INR 3.0    LIPID PANEL     Status: None   Collection Time    03/22/13 11:52 AM      Result Value Range   Cholesterol 138  0 - 200 mg/dL   Comment: ATP III Classification       Desirable:  < 200 mg/dL               Borderline High:  200 - 239  mg/dL          High:  > = 240 mg/dL   Triglycerides 95.0  0.0 -  149.0 mg/dL   Comment: Normal:  <150 mg/dLBorderline High:  150 - 199 mg/dL   HDL 42.60  >39.00 mg/dL   VLDL 19.0  0.0 - 40.0 mg/dL   LDL Cholesterol 76  0 - 99 mg/dL   Total CHOL/HDL Ratio 3     Comment:                Men          Women1/2 Average Risk     3.4          3.3Average Risk          5.0          4.42X Average Risk          9.6          7.13X Average Risk          15.0          11.0                      HEMOGLOBIN AND HEMATOCRIT, BLOOD     Status: None   Collection Time    03/22/13 11:52 AM      Result Value Range   Hemoglobin 14.4  13.0 - 17.0 g/dL   HCT 44.3  39.0 - 52.0 %  COMPREHENSIVE METABOLIC PANEL     Status: Abnormal   Collection Time    03/22/13 11:52 AM      Result Value Range   Sodium 141  135 - 145 mEq/L   Potassium 4.1  3.5 - 5.1 mEq/L   Chloride 106  96 - 112 mEq/L   CO2 30  19 - 32 mEq/L   Glucose, Bld 105 (*) 70 - 99 mg/dL   BUN 17  6 - 23 mg/dL   Creatinine, Ser 1.1  0.4 - 1.5 mg/dL   Total Bilirubin 0.8  0.3 - 1.2 mg/dL   Alkaline Phosphatase 38 (*) 39 - 117 U/L   AST 23  0 - 37 U/L   ALT 17  0 - 53 U/L   Total Protein 7.2  6.0 - 8.3 g/dL   Albumin 4.1  3.5 - 5.2 g/dL   Calcium 9.2  8.4 - 10.5 mg/dL   GFR 68.06  >60.00 mL/min  POCT INR     Status: None   Collection Time    03/24/13 11:06 AM      Result Value Range   INR 2.8           Assessment & Plan:

## 2013-03-22 NOTE — Progress Notes (Signed)
Pre visit review using our clinic review tool, if applicable. No additional management support is needed unless otherwise documented below in the visit note. 

## 2013-03-22 NOTE — Patient Instructions (Signed)
Good to see you.  The urology work up does make sense and the good news is that the evaluation was normal of the prostate and the bladder. You do not need prostate cancer screening ever again and you do not need any more bladder evaluation. 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 The are of the hernia repair looks good.   Will check routine lab today and results will be posted to [facility].  Colon cancer screening - will do Colo-Guard stool test, a very sensitive DNA analysis, that is easier for you to handle.  Immunizations - current but we will give you the prevnar pneumonia vaccine today - once and done.   Thank you for allowing me to be your physician all these years. My colleagues will continue to provide you with high quality medical care.

## 2013-03-23 ENCOUNTER — Ambulatory Visit
Admission: RE | Admit: 2013-03-23 | Discharge: 2013-03-23 | Disposition: A | Discharge status: Home / Self Care | Payer: MEDICARE | Source: Ambulatory Visit | Attending: Internal Medicine | Admitting: Internal Medicine

## 2013-03-23 DIAGNOSIS — D1771 Benign lipomatous neoplasm of kidney: Secondary | ICD-10-CM

## 2013-03-23 DIAGNOSIS — D3001 Benign neoplasm of right kidney: Secondary | ICD-10-CM

## 2013-03-24 ENCOUNTER — Ambulatory Visit (INDEPENDENT_AMBULATORY_CARE_PROVIDER_SITE_OTHER): Payer: MEDICARE | Admitting: General Practice

## 2013-03-24 ENCOUNTER — Other Ambulatory Visit: Payer: Self-pay

## 2013-03-24 DIAGNOSIS — Z5181 Encounter for therapeutic drug level monitoring: Secondary | ICD-10-CM

## 2013-03-24 DIAGNOSIS — C342 Malignant neoplasm of middle lobe, bronchus or lung: Secondary | ICD-10-CM

## 2013-03-24 DIAGNOSIS — G459 Transient cerebral ischemic attack, unspecified: Secondary | ICD-10-CM

## 2013-03-24 DIAGNOSIS — I4891 Unspecified atrial fibrillation: Secondary | ICD-10-CM

## 2013-03-24 LAB — POCT INR: INR: 2.8

## 2013-03-24 MED ORDER — TERAZOSIN HCL 5 MG PO CAPS: 5.0000 mg | ORAL_CAPSULE | Freq: Once | ORAL | Status: DC

## 2013-03-24 MED ORDER — FINASTERIDE 5 MG PO TABS: 5.0000 mg | ORAL_TABLET | Freq: Every day | ORAL | Status: DC

## 2013-03-24 MED ORDER — SIMVASTATIN 20 MG PO TABS: 20.0000 mg | ORAL_TABLET | Freq: Every day | ORAL | Status: DC

## 2013-03-24 MED ORDER — WARFARIN SODIUM 5 MG PO TABS: ORAL_TABLET | ORAL | Status: DC

## 2013-03-24 MED ORDER — DIAZEPAM 5 MG PO TABS
5.0000 mg | ORAL_TABLET | Freq: Two times a day (BID) | ORAL | Status: DC
Start: 2013-03-24 — End: 2013-12-04

## 2013-03-24 NOTE — Progress Notes (Signed)
Pre-visit discussion using our clinic review tool. No additional management support is needed unless otherwise documented below in the visit note.  

## 2013-03-25 NOTE — Assessment & Plan Note (Signed)
Stable with mild cognitive impairment that has not changed over the years.  Plan Continue risk reduction strategies

## 2013-03-25 NOTE — Assessment & Plan Note (Signed)
Stable and doing well. He follows with Dr. Julien Nordmann with last visit August '14: imaging showed no evidence of recurrence.

## 2013-03-25 NOTE — Assessment & Plan Note (Signed)
Interval history is benign. Limited physical exam is normal. Labs reviewed - in normal range. He is current w/ colorectal cancer screening. He has had a complete urologic evaluation including prostate biopsy that was negative - he does not need any further screening or testing. He had a full work-up for hematuria including cysto w/ biopsy that was negative. 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  Immunizations are up to date and Prevnar is given today.  In summary A very nice man who has had a complex medical history but appears to be medically stable at this time. He will return in 1 year or sooner as needed.

## 2013-03-25 NOTE — Assessment & Plan Note (Signed)
No complaints of chest pain, no limitations in activity.  Plan Continue risk reduction therapies

## 2013-03-25 NOTE — Assessment & Plan Note (Signed)
Taking and tolerating "statin" treatment w/o complications.   Plan Routine lab with recommendations to follow   Addendum: LDL less than goal of 80, HDL at goal of 40+, LFTs normal

## 2013-03-25 NOTE — Assessment & Plan Note (Signed)
Taking omeprazole daily. Symptoms are well controlled. No complications of treatment

## 2013-03-25 NOTE — Assessment & Plan Note (Signed)
BP Readings from Last 3 Encounters:  03/22/13 120/80  10/06/12 130/62  10/05/12 115/67   Great control of blood pressure on present regimen

## 2013-03-27 ENCOUNTER — Telehealth: Payer: Self-pay

## 2013-03-27 NOTE — Telephone Encounter (Signed)
Scripts that were printed 03/24/13 have been signed and mailed to patient today

## 2013-05-03 ENCOUNTER — Telehealth: Payer: Self-pay | Admitting: *Deleted

## 2013-05-03 NOTE — Telephone Encounter (Signed)
Spouse phoned wanting to confirm PCP receipt of test results being faxed in from outside facility-none showing in epic.   Please advise.  CB# [phone]

## 2013-05-04 NOTE — Telephone Encounter (Signed)
Spoke with pt advised of MDs message 

## 2013-05-04 NOTE — Telephone Encounter (Signed)
Not seen yet but I have unread reports in my fax file.

## 2013-05-05 ENCOUNTER — Ambulatory Visit (INDEPENDENT_AMBULATORY_CARE_PROVIDER_SITE_OTHER): Payer: MEDICARE | Admitting: General Practice

## 2013-05-05 ENCOUNTER — Telehealth: Payer: Self-pay

## 2013-05-05 DIAGNOSIS — I4891 Unspecified atrial fibrillation: Secondary | ICD-10-CM

## 2013-05-05 DIAGNOSIS — Z5181 Encounter for therapeutic drug level monitoring: Secondary | ICD-10-CM

## 2013-05-05 LAB — POCT INR: INR: 2.5

## 2013-05-05 NOTE — Telephone Encounter (Signed)
Called Pt and advised him of negative stool test results.

## 2013-05-05 NOTE — Progress Notes (Signed)
Pre visit review using our clinic review tool, if applicable. No additional management support is needed unless otherwise documented below in the visit note. 

## 2013-05-19 ENCOUNTER — Encounter: Payer: Self-pay | Admitting: Internal Medicine

## 2013-06-16 ENCOUNTER — Ambulatory Visit (INDEPENDENT_AMBULATORY_CARE_PROVIDER_SITE_OTHER): Payer: MEDICARE | Admitting: General Practice

## 2013-06-16 DIAGNOSIS — I4891 Unspecified atrial fibrillation: Secondary | ICD-10-CM

## 2013-06-16 DIAGNOSIS — Z5181 Encounter for therapeutic drug level monitoring: Secondary | ICD-10-CM

## 2013-06-16 DIAGNOSIS — G459 Transient cerebral ischemic attack, unspecified: Secondary | ICD-10-CM

## 2013-06-16 LAB — POCT INR: INR: 3.1

## 2013-06-16 NOTE — Progress Notes (Signed)
Pre visit review using our clinic review tool, if applicable. No additional management support is needed unless otherwise documented below in the visit note. 

## 2013-07-27 ENCOUNTER — Encounter: Payer: Self-pay | Admitting: Family Medicine

## 2013-07-27 ENCOUNTER — Encounter: Payer: Self-pay | Admitting: Internal Medicine

## 2013-07-27 ENCOUNTER — Ambulatory Visit (INDEPENDENT_AMBULATORY_CARE_PROVIDER_SITE_OTHER): Payer: MEDICARE | Admitting: Family Medicine

## 2013-07-27 ENCOUNTER — Other Ambulatory Visit (INDEPENDENT_AMBULATORY_CARE_PROVIDER_SITE_OTHER): Payer: MEDICARE

## 2013-07-27 ENCOUNTER — Ambulatory Visit (INDEPENDENT_AMBULATORY_CARE_PROVIDER_SITE_OTHER): Payer: MEDICARE | Admitting: Internal Medicine

## 2013-07-27 VITALS — BP 110/70 | HR 63 | Temp 97.5°F | Wt 206.6 lb

## 2013-07-27 VITALS — BP 110/70 | HR 63 | Temp 97.5°F | Resp 12 | Wt 206.0 lb

## 2013-07-27 DIAGNOSIS — M25069 Hemarthrosis, unspecified knee: Secondary | ICD-10-CM

## 2013-07-27 DIAGNOSIS — M25469 Effusion, unspecified knee: Secondary | ICD-10-CM

## 2013-07-27 DIAGNOSIS — I2510 Atherosclerotic heart disease of native coronary artery without angina pectoris: Secondary | ICD-10-CM

## 2013-07-27 DIAGNOSIS — Z5181 Encounter for therapeutic drug level monitoring: Secondary | ICD-10-CM

## 2013-07-27 DIAGNOSIS — M5030 Other cervical disc degeneration, unspecified cervical region: Secondary | ICD-10-CM

## 2013-07-27 DIAGNOSIS — M25462 Effusion, left knee: Secondary | ICD-10-CM

## 2013-07-27 DIAGNOSIS — M25062 Hemarthrosis, left knee: Secondary | ICD-10-CM

## 2013-07-27 LAB — PROTIME-INR
INR: 2.6 ratio — AB (ref 0.8–1.0)
PROTHROMBIN TIME: 27.8 s — AB (ref 9.6–13.1)

## 2013-07-27 NOTE — Progress Notes (Signed)
Bernard Bernard [name] [id number] Phone: [phone] Subjective:    I'm seeing this patient by the request  of:  Bernard Kehr, MD   CC: Left knee swelling  Bernard [name]:Bernard Bernard [name] Bernard [name] is a 80 y.o. male coming in with complaint of left knee swelling. Patient states that this started approximately in 4-5 days ago. Patient is walking and started having some discomfort. Patient noticed that his knee was swollen at that time. Patient is accompanied with his wife he states that there was no type of injury. Patient does have a past medical history significant for atrial fibrillation, history of CVA, and coronary artery disease on Coumadin. Patient was concern that this could be potentially a clot formation. Patient's last INR was June 16, 2013 and was 3.1. Patient does not make any adjustments to his Coumadin dosing at that time. Patient states the pain is mostly in the knee and seems to be an anterior as well as posterior aspects and can seem to radiate down his calf. Patient denies any fevers or chills or any abnormal weight loss. Patient also denies any shortness of breath. Patient is able to ambulate but is finds it difficult to flex his knee. Patient states going up stairs is very difficult. Patient rates the severity pain is 7/10. Patient has not tried any home modalities at this time.     Past medical history, social, surgical and family history all reviewed in electronic medical record.   Review of Systems: No headache, visual changes, nausea, vomiting, diarrhea, constipation, dizziness, abdominal pain, skin rash, fevers, chills, night sweats, weight loss, swollen lymph nodes, body aches, joint swelling, muscle aches, chest pain, shortness of breath, mood changes.   Objective Blood pressure 110/70, pulse 63, temperature 97.5 F (36.4 C), resp. rate 12, weight 206 lb (93.441 kg), SpO2 94.00%.  General: No apparent distress alert and  oriented x3 mood and affect normal, dressed appropriately.  HEENT: Pupils equal, extraocular movements intact  Respiratory: Patient's speak in full sentences and does not appear short of breath  Cardiovascular: No lower extremity edema, non tender, no erythema  Skin: Warm dry intact with no signs of infection or rash on extremities or on axial skeleton.  Abdomen: Soft nontender  Neuro: Cranial nerves II through XII are intact, neurovascularly intact in all extremities with 2+ DTRs and 2+ pulses.  Lymph: No lymphadenopathy of posterior or anterior cervical chain or axillae bilaterally.  Gait antalgic gait.  MSK:  Non tender with full range of motion and good stability and symmetric strength and tone of shoulders, elbows, wrist, hip,   and ankles bilaterally.  Knee: Left Inspection revealed the patient does have trace effusion especially of the suprapatellar pouch Mild warmness stated to touch. Patient is tender to palpation over the medial joint line. ROM  is lacking with extension to to 10 and flexion to 80. Negative Mcmurray's, Apley's, and Thessalonian tests. Patellar glide moderate crepitus. Patellar and quadriceps tendons unremarkable. Hamstring and quadriceps strength is normal.  Contralateral knee has total arthroplasty with well-healed incision. Nontender on exam.  MSK US performed of: Left knee This study was ordered, performed, and interpreted by Charlann Boxer D.O.  Knee: All structures visualized. Patient's medial and lateral joint line do show some mild narrowing. Patient does have a large effusion noted in the suprapatellar pouch and within the knee itself. Patellar Tendon unremarkable on long and transverse views without effusion. No abnormality of prepatellar bursa. LCL and MCL unremarkable  on long and transverse views. No abnormality of origin of medial or lateral head of the gastrocnemius.  IMPRESSION: Knee effusion with underlying osteoarthritis   After verbal  consent patient was prepped with alcohol swabs and under ultrasound guidance did have a 27-gauge 1-1/2 inch needle inserted in the suprapatellar pouch and a superior lateral fashion. Patient did have 3 cc of 0.5% Marcaine injected into the area. Patient then had 40 cc of frank blood removed from the knee joint itself. Patient tolerated the procedure well and have full range of motion afterwards. Post aspiration instructions given.       Impression and Recommendations:     This case required medical decision making of moderate complexity.

## 2013-07-27 NOTE — Progress Notes (Signed)
Pre visit review using our clinic review tool, if applicable. No additional management support is needed unless otherwise documented below in the visit note. 

## 2013-07-27 NOTE — Progress Notes (Signed)
   Subjective:    Patient ID: Bernard [name], male    DOB: 1932-08-27, 80 y.o.   MRN: [id number]  HPI  He had just started to walk yesterday afternoon when he experienced acute pain in the left knee with associated swelling. There was no injury or trauma. He denies hearing any "pop or tear". He is on warfarin; there was no ecchymosis or discoloration noted. The pain is described as constant up to a level X. Standing & walking do increase the pain  Ephraim Hamburger topically was of no benefit.  The pain and swelling has persisted.  He has had a total knee replacement on the right.  His wife was concerned about possible blood clots if his bleeding time were "too thick"      Review of Systems  He denies associated chest pain, palpitations, dyspnea  He has had some pain in the posterior cervicals spine with range of motion.     Objective:   Physical Exam  He appears healthy and well-nourished & in no distress  He has no lymphadenopathy about the neck or axilla  He does have decreased range of motion laterally in the cervical spine. This associated with pain particularly with left lateral rotation.  Chest is clear with no increased work of breathing  Rhythm is regular with no significant murmurs or gallops. S4 present  Hands reveal minimal arthritic changes  He has dramatic edema of the left knee circumferentially around the patella and into the upper anterior shin. There is no tenderness to palpation of the popliteal area.  Postoperative changes right knee with fusiform enlargement  Dorsalis pedis pulses are decreased  Homans sign is negative.  He has no cyanosis, clubbing, or edema.        Assessment & Plan:  #1 acute patellar effusion  #2 anticoagulant therapy  Plan: Sports medicine referral   PT/INR to verify adequate anticoagulation.

## 2013-07-27 NOTE — Patient Instructions (Signed)
Your next office appointment will be determined based upon review of your pending PT/INR. Those instructions will be transmitted to you through My Chart

## 2013-07-27 NOTE — Patient Instructions (Signed)
Good to see you Ice 20 minutes 3 times daily Wear compression daily until I see you.  But not at night.  Try exercises 3 times a week for your neck.  Get INR check downstairs.  See you in 1 weeks.

## 2013-07-27 NOTE — Assessment & Plan Note (Signed)
Patient did have a significant hemarthrosis of this knee. Patient states that it was not dramatic. Dr. Linna Darner is ordering INR which likely will be elevated. Patient was given a compression sleeve today. We discussed icing protocol and trying to avoid too much activity until we get the hemarthrosis completely resolved. Patient is feeling better already and was walking more normal on his way out. Patient is going to follow up again in one week for further evaluation knowing that there is a possibility that aspiration may be necessary again.

## 2013-07-28 ENCOUNTER — Ambulatory Visit (INDEPENDENT_AMBULATORY_CARE_PROVIDER_SITE_OTHER): Payer: MEDICARE | Admitting: General Practice

## 2013-07-28 DIAGNOSIS — I4891 Unspecified atrial fibrillation: Secondary | ICD-10-CM

## 2013-07-28 DIAGNOSIS — Z5181 Encounter for therapeutic drug level monitoring: Secondary | ICD-10-CM

## 2013-07-28 NOTE — Progress Notes (Signed)
Pre visit review using our clinic review tool, if applicable. No additional management support is needed unless otherwise documented below in the visit note. 

## 2013-08-03 ENCOUNTER — Ambulatory Visit (INDEPENDENT_AMBULATORY_CARE_PROVIDER_SITE_OTHER): Payer: MEDICARE | Admitting: Family Medicine

## 2013-08-03 ENCOUNTER — Encounter: Payer: Self-pay | Admitting: Family Medicine

## 2013-08-03 VITALS — BP 122/72 | HR 57 | Ht 70.0 in | Wt 207.0 lb

## 2013-08-03 DIAGNOSIS — M542 Cervicalgia: Secondary | ICD-10-CM

## 2013-08-03 DIAGNOSIS — M25062 Hemarthrosis, left knee: Secondary | ICD-10-CM

## 2013-08-03 DIAGNOSIS — M25069 Hemarthrosis, unspecified knee: Secondary | ICD-10-CM

## 2013-08-03 DIAGNOSIS — I2510 Atherosclerotic heart disease of native coronary artery without angina pectoris: Secondary | ICD-10-CM

## 2013-08-03 NOTE — Patient Instructions (Signed)
Good to see you Wear the sleeve with a lot of activity  Ice the knee when you need it Exercises 3 times a week for your neck.  Ice can help the neck as well.  Keep everything at eye level See me again when you need me.

## 2013-08-03 NOTE — Assessment & Plan Note (Signed)
Patient likely is going to have osteoarthritis of the neck. Patient though is responding very well to conservative therapy. Patient will come back again in 3-4 weeks if continued have any discomfort. At that time I would consider getting imaging to further evaluate and possibly formal physical therapy.

## 2013-08-03 NOTE — Progress Notes (Signed)
[name] [place] [address] [place], [place] [address] Phone: [phone] Subjective:      CC: Left knee swelling follow up, neck pain  PJK:[id number] YOGI ARTHER is a 80 y.o. male coming in for follow up of left knee pain. Patient was seen previously and did have a knee effusion with frank blood. Patient had 40 cc of blood removed from the knee was put in a compression sleeve. Patient is on Coumadin for his other comorbidities and his INR was 2.6. Patient states that there has been no further swelling in the knee. Denies any pain. Patient states that the compression sleeve is very comfortable.   Patient also complained of neck pain. Patient at last visit was given a handout home exercises and states that this has are to decrease his pain by about 85%. Patient has been able to do more the range of motion and is happy with the results. Denies any radiation into arms or any numbness or weakness. Patient is able to do all activities of daily living. Patient was severity at 2/10. No nighttime awakenings.    Past medical history, social, surgical and family history all reviewed in electronic medical record.   Review of Systems: No headache, visual changes, nausea, vomiting, diarrhea, constipation, dizziness, abdominal pain, skin rash, fevers, chills, night sweats, weight loss, swollen lymph nodes, body aches, joint swelling, muscle aches, chest pain, shortness of breath, mood changes.   Objective Blood pressure 122/72, pulse 57, height 5\' 10"  (1.778 m), weight 207 lb (93.895 kg), SpO2 95.00%.  General: No apparent distress alert and oriented x3 mood and affect normal, dressed appropriately.  HEENT: Pupils equal, extraocular movements intact  Respiratory: Patient's speak in full sentences and does not appear short of breath  Cardiovascular: No lower extremity edema, non tender, no erythema  Skin: Warm dry intact with no signs of infection or rash on extremities or  on axial skeleton.  Abdomen: Soft nontender  Neuro: Cranial nerves II through XII are intact, neurovascularly intact in all extremities with 2+ DTRs and 2+ pulses.  Lymph: No lymphadenopathy of posterior or anterior cervical chain or axillae bilaterally.  Gait antalgic gait.  MSK:  Non tender with full range of motion and good stability and symmetric strength and tone of shoulders, elbows, wrist, hip,   and ankles bilaterally.  Neck: Inspection unremarkable. No palpable stepoffs. Negative Spurling's maneuver. Patient does have mild crepitus and does have decreased range of motion with rotation flexion the last 10 bilaterally as well as side bending. Full flexion and extension. Grip strength and sensation normal in bilateral hands Strength good C4 to T1 distribution No sensory change to C4 to T1 Negative Hoffman sign bilaterally Reflexes normal Knee: Left Inspection reveals no effusion  Continued mild medial joint line tenderness ROM  is lacking with extension to to 10 and flexion to 80. Negative Mcmurray's, Apley's, and Thessalonian tests. Patellar glide moderate crepitus. Patellar and quadriceps tendons unremarkable. Hamstring and quadriceps strength is normal.  Contralateral knee has total arthroplasty with well-healed incision. Nontender on exam.  MSK US performed of: Left knee This study was ordered, performed, and interpreted by Charlann Boxer D.O.  Knee: All structures visualized. Patient's medial and lateral joint line do show some mild narrowing. Trace effusion still noted Patellar Tendon unremarkable on long and transverse views without effusion. No abnormality of prepatellar bursa. LCL and MCL unremarkable on long and transverse views. No abnormality of origin of medial or lateral head of the gastrocnemius.  IMPRESSION: Improved knee effusion     Impression and Recommendations:     This case required medical decision making of moderate complexity.

## 2013-08-03 NOTE — Assessment & Plan Note (Signed)
Seems to be resolved at this time. No further findings were cause with patient had a normal INR. Patient has had a significant past medical history of other comorbidities that could have contributed to this problem. At this point I would like to just monitor. Patient has any recurrent swelling he noted some back for further evaluation. Patient has any recurrent knee pain we'll consider further imaging. Otherwise patient will follow up on an as-needed basis. We discussed to in the interim the patient is going to continue the compression sleeve and was given home exercise program insure proper technique.  Spent greater than 25 minutes with patient face-to-face and had greater than 50% of counseling including as described above in assessment and plan.

## 2013-08-09 ENCOUNTER — Encounter: Payer: Self-pay | Admitting: Cardiology

## 2013-09-08 ENCOUNTER — Ambulatory Visit (INDEPENDENT_AMBULATORY_CARE_PROVIDER_SITE_OTHER): Payer: MEDICARE | Admitting: General Practice

## 2013-09-08 DIAGNOSIS — G459 Transient cerebral ischemic attack, unspecified: Secondary | ICD-10-CM

## 2013-09-08 DIAGNOSIS — I4891 Unspecified atrial fibrillation: Secondary | ICD-10-CM

## 2013-09-08 DIAGNOSIS — Z5181 Encounter for therapeutic drug level monitoring: Secondary | ICD-10-CM

## 2013-09-08 LAB — POCT INR: INR: 3.4

## 2013-09-08 NOTE — Progress Notes (Signed)
Pre visit review using our clinic review tool, if applicable. No additional management support is needed unless otherwise documented below in the visit note. 

## 2013-10-03 ENCOUNTER — Encounter (HOSPITAL_COMMUNITY): Payer: Self-pay

## 2013-10-03 ENCOUNTER — Other Ambulatory Visit (HOSPITAL_BASED_OUTPATIENT_CLINIC_OR_DEPARTMENT_OTHER): Payer: MEDICARE

## 2013-10-03 ENCOUNTER — Ambulatory Visit (HOSPITAL_COMMUNITY)
Admission: RE | Admit: 2013-10-03 | Discharge: 2013-10-03 | Disposition: A | Discharge status: Home / Self Care | Payer: MEDICARE | Source: Ambulatory Visit | Attending: Internal Medicine | Admitting: Internal Medicine

## 2013-10-03 DIAGNOSIS — C342 Malignant neoplasm of middle lobe, bronchus or lung: Secondary | ICD-10-CM

## 2013-10-03 DIAGNOSIS — J984 Other disorders of lung: Secondary | ICD-10-CM | POA: Insufficient documentation

## 2013-10-03 DIAGNOSIS — C3490 Malignant neoplasm of unspecified part of unspecified bronchus or lung: Secondary | ICD-10-CM | POA: Diagnosis present

## 2013-10-03 LAB — COMPREHENSIVE METABOLIC PANEL
ALK PHOS: 37 U/L — AB (ref 39–117)
ALT: 13 U/L (ref 0–53)
AST: 21 U/L (ref 0–37)
Albumin: 3.8 g/dL (ref 3.5–5.2)
BUN: 17 mg/dL (ref 6–23)
CALCIUM: 9.4 mg/dL (ref 8.4–10.5)
CHLORIDE: 102 meq/L (ref 96–112)
CO2: 28 mEq/L (ref 19–32)
CREATININE: 1.16 mg/dL (ref 0.50–1.35)
Glucose, Bld: 101 mg/dL — ABNORMAL HIGH (ref 70–99)
Potassium: 4.4 mEq/L (ref 3.5–5.3)
Sodium: 139 mEq/L (ref 135–145)
Total Bilirubin: 0.5 mg/dL (ref 0.2–1.2)
Total Protein: 7 g/dL (ref 6.0–8.3)

## 2013-10-03 LAB — CBC WITH DIFFERENTIAL/PLATELET
BASO%: 0.5 % (ref 0.0–2.0)
BASOS ABS: 0 10*3/uL (ref 0.0–0.1)
EOS%: 1 % (ref 0.0–7.0)
Eosinophils Absolute: 0.1 10*3/uL (ref 0.0–0.5)
HEMATOCRIT: 42.6 % (ref 38.4–49.9)
HGB: 13.7 g/dL (ref 13.0–17.1)
LYMPH#: 2.5 10*3/uL (ref 0.9–3.3)
LYMPH%: 35.9 % (ref 14.0–49.0)
MCH: 29.7 pg (ref 27.2–33.4)
MCHC: 32.2 g/dL (ref 32.0–36.0)
MCV: 92.2 fL (ref 79.3–98.0)
MONO#: 0.5 10*3/uL (ref 0.1–0.9)
MONO%: 7.2 % (ref 0.0–14.0)
NEUT#: 3.8 10*3/uL (ref 1.5–6.5)
NEUT%: 55.4 % (ref 39.0–75.0)
Platelets: 130 10*3/uL — ABNORMAL LOW (ref 140–400)
RBC: 4.62 10*6/uL (ref 4.20–5.82)
RDW: 14.3 % (ref 11.0–14.6)
WBC: 6.9 10*3/uL (ref 4.0–10.3)

## 2013-10-03 MED ORDER — IOHEXOL 300 MG/ML  SOLN
80.0000 mL | Freq: Once | INTRAMUSCULAR | Status: AC | PRN
  Administered 2013-10-03: 80 mL via INTRAVENOUS

## 2013-10-04 ENCOUNTER — Ambulatory Visit (INDEPENDENT_AMBULATORY_CARE_PROVIDER_SITE_OTHER): Payer: MEDICARE | Admitting: *Deleted

## 2013-10-04 DIAGNOSIS — G459 Transient cerebral ischemic attack, unspecified: Secondary | ICD-10-CM

## 2013-10-04 DIAGNOSIS — I4891 Unspecified atrial fibrillation: Secondary | ICD-10-CM

## 2013-10-04 DIAGNOSIS — Z5181 Encounter for therapeutic drug level monitoring: Secondary | ICD-10-CM

## 2013-10-04 LAB — POCT INR: INR: 2.9

## 2013-10-05 ENCOUNTER — Ambulatory Visit (HOSPITAL_BASED_OUTPATIENT_CLINIC_OR_DEPARTMENT_OTHER): Payer: MEDICARE | Admitting: Internal Medicine

## 2013-10-05 ENCOUNTER — Encounter: Payer: Self-pay | Admitting: Internal Medicine

## 2013-10-05 ENCOUNTER — Telehealth: Payer: Self-pay | Admitting: Internal Medicine

## 2013-10-05 VITALS — BP 127/63 | HR 59 | Temp 97.8°F | Resp 18 | Ht 70.0 in | Wt 209.4 lb

## 2013-10-05 DIAGNOSIS — C342 Malignant neoplasm of middle lobe, bronchus or lung: Secondary | ICD-10-CM

## 2013-10-05 DIAGNOSIS — I2510 Atherosclerotic heart disease of native coronary artery without angina pectoris: Secondary | ICD-10-CM

## 2013-10-05 DIAGNOSIS — Z85118 Personal history of other malignant neoplasm of bronchus and lung: Secondary | ICD-10-CM

## 2013-10-05 NOTE — Telephone Encounter (Signed)
, °

## 2013-10-05 NOTE — Progress Notes (Signed)
[facility] [phone]   [phone]  OFFICE PROGRESS NOTE  [name], MD [place] [address]  PRINCIPAL DIAGNOSIS: Stage IA non-small-cell lung cancer (T1a N0 M0) adenocarcinoma diagnosed in June 2010.   PRIOR THERAPY: Status post right middle lobectomy under the care of Dr. Arlyce Dice on August 28, 2008.   CURRENT THERAPY: Observation.  INTERVAL HISTORY: Bernard [name] 80 y.o. male returns to the clinic today for annual follow up visit accompanied by his wife. He has been observation for the last 5 years with no significant evidence for disease recurrence. The patient is feeling fine today with no specific complaints. He denied having any significant chest pain, shortness of breath, cough or hemoptysis. Marland KitchenHe has no weight loss or night sweats. He was recently found to have gallstones are currently on observation. The patient has repeat CT scan of the chest performed recently and he is here for evaluation and discussion of his scan results.  MEDICAL HISTORY: Past Medical History  Diagnosis Date  . History of BPH     benign prostatic hypertrophy  . Paroxysmal atrial fibrillation     postoperative after lung surgery  . Osteoarthrosis, unspecified whether generalized or localized, lower leg     knee  . Labyrinthitis, unspecified   . Esophageal reflux   . Cerebrovascular disease     s/p prior stroke  . Unspecified essential hypertension   . Other and unspecified hyperlipidemia   . Depressive disorder, not elsewhere classified   . CAD (coronary artery disease)     s/p MI 58s, normal myoview 7/11  . Cancer     lung ca dx'd 2010  . Malignant neoplasm middle lobe, bronchus or lung     right lung. Dx 07/27/08, sp/p resection 7/10.     ALLERGIES:  has No Known Allergies.  MEDICATIONS:  Current Outpatient Prescriptions  Medication Sig Dispense Refill  . APAP-Isometheptene-Dichloral 325-65-100 MG per capsule Take 1 capsule by  mouth 4 (four) times daily as needed.  30 capsule  3  . Calcium Carbonate (CALTRATE 600) 1500 MG TABS Take 1 tablet by mouth daily.        . cholecalciferol (VITAMIN D) 1000 UNITS tablet Take 1,000 Units by mouth daily.        . diazepam (VALIUM) 5 MG tablet Take 1 tablet (5 mg total) by mouth 2 (two) times daily.  180 tablet  3  . finasteride (PROSCAR) 5 MG tablet Take 1 tablet (5 mg total) by mouth daily.  90 tablet  3  . multivitamin-lutein (OCUVITE-LUTEIN) CAPS Take 1 capsule by mouth daily.      [name] omeprazole (PRILOSEC) 20 MG capsule Take 1 capsule (20 mg total) by mouth daily. Every other day   90 capsule  1  . simvastatin (ZOCOR) 20 MG tablet Take 1 tablet (20 mg total) by mouth daily.  90 tablet  3  . terazosin (HYTRIN) 5 MG capsule Take 1 capsule (5 mg total) by mouth once.  90 capsule  3  . warfarin (COUMADIN) 5 MG tablet Take as directed by anticoagulation clinic  90 tablet  3  . nitroGLYCERIN (NITROSTAT) 0.4 MG SL tablet Place 1 tablet (0.4 mg total) under the tongue every 5 (five) minutes as needed.  25 tablet  3   No current facility-administered medications for this visit.    SURGICAL HISTORY:  Past Surgical History  Procedure Laterality Date  . Esophagogastroduodenoscopy  05/14/04  . Bunionectomy  '  89    left foot  . Inguinal hernia repair  '71    left  . Anthroscopic surgery  '10    right knee \  . Vat-rml lobectomy    . Right tkr  8/11  . Eye surgery      both eyes - 2011 [facility])    REVIEW OF SYSTEMS:  A comprehensive review of systems was negative.   PHYSICAL EXAMINATION: General appearance: alert, cooperative and no distress Head: Normocephalic, without obvious abnormality, atraumatic Neck: no adenopathy Lymph nodes: Cervical, supraclavicular, and axillary nodes normal. Resp: clear to auscultation bilaterally Cardio: regular rate and rhythm, S1, S2 normal, no murmur, click, rub or gallop GI: soft, non-tender; bowel sounds normal; no masses,  no  organomegaly Extremities: extremities normal, atraumatic, no cyanosis or edema  ECOG PERFORMANCE STATUS: 1 - Symptomatic but completely ambulatory  Blood pressure 127/63, pulse 59, temperature 97.8 F (36.6 C), temperature source Oral, resp. rate 18, height 5\' 10"  (1.778 m), weight 209 lb 6.4 oz (94.983 kg).  LABORATORY DATA: Lab Results  Component Value Date   WBC 6.9 10/03/2013   HGB 13.7 10/03/2013   HCT 42.6 10/03/2013   MCV 92.2 10/03/2013   PLT 130* 10/03/2013      Chemistry      Component Value Date/Time   NA 139 10/03/2013 1049   NA 141 10/03/2012 0903   NA 141 09/29/2011 1218   K 4.4 10/03/2013 1049   K 4.2 10/03/2012 0903   K 4.4 09/29/2011 1218   CL 102 10/03/2013 1049   CL 103 09/29/2011 1218   CO2 28 10/03/2013 1049   CO2 24 10/03/2012 0903   CO2 28 09/29/2011 1218   BUN 17 10/03/2013 1049   BUN 14.9 10/03/2012 0903   BUN 16 09/29/2011 1218   CREATININE 1.16 10/03/2013 1049   CREATININE 1.0 10/03/2012 0903   CREATININE 1.3* 09/29/2011 1218      Component Value Date/Time   CALCIUM 9.4 10/03/2013 1049   CALCIUM 8.7 10/03/2012 0903   CALCIUM 8.8 09/29/2011 1218   ALKPHOS 37* 10/03/2013 1049   ALKPHOS 36* 10/03/2012 0903   ALKPHOS 38 09/29/2011 1218   AST 21 10/03/2013 1049   AST 17 10/03/2012 0903   AST 29 09/29/2011 1218   ALT 13 10/03/2013 1049   ALT 13 10/03/2012 0903   ALT 27 09/29/2011 1218   BILITOT 0.5 10/03/2013 1049   BILITOT 0.40 10/03/2012 0903   BILITOT 1.00 09/29/2011 1218       RADIOGRAPHIC STUDIES: Ct Chest W Contrast  10/03/2013   CLINICAL DATA:  Followup lung carcinoma.  EXAM: CT CHEST WITH CONTRAST  TECHNIQUE: Multidetector CT imaging of the chest was performed during intravenous contrast administration.  CONTRAST:  37mL OMNIPAQUE IOHEXOL 300 MG/ML  SOLN  COMPARISON:  10/03/2012  FINDINGS: Mediastinum/Hilar Regions: No masses or pathologically enlarged lymph nodes identified. Surgical clips again noted in right hilum.  Lungs: Postop scarring in the right midlung appears  stable. No suspicious pulmonary nodules or masses are identified. No evidence of pulmonary infiltrate or central endobronchial obstruction.  Pleura:  No evidence of effusion or mass.  Vascular/Cardiac: No thoracic aortic aneurysm or other significant abnormality identified.  Musculoskeletal:  No suspicious bone lesions identified.  Other:  None.  IMPRESSION: Stable postop changes in right hemithorax. No evidence of recurrent or metastatic carcinoma.   Electronically Signed   By: Earle Gell M.D.   On: 10/03/2013 14:22   ASSESSMENT AND PLAN: this is a very pleasant 80  years old white male with a stage IA non-small cell lung cancer status post resection and has been observation since 2010 with no evidence for disease recurrence.  I discussed the scan results with the patient and his wife.  I recommended for him to continue on observation with repeat CT scan of the chest without contrast in one year. He was advised to call immediately if he has any concerning symptoms in the interval.  The patient voices understanding of current disease status and treatment options and is in agreement with the current care plan.  All questions were answered. The patient knows to call the clinic with any problems, questions or concerns. We can certainly see the patient much sooner if necessary.  Disclaimer: This note was dictated with voice recognition software. Similar sounding words can inadvertently be transcribed and may be missed upon review.

## 2013-10-10 ENCOUNTER — Ambulatory Visit: Payer: MEDICARE | Admitting: Cardiology

## 2013-10-12 ENCOUNTER — Encounter: Payer: Self-pay | Admitting: Internal Medicine

## 2013-10-12 ENCOUNTER — Ambulatory Visit (INDEPENDENT_AMBULATORY_CARE_PROVIDER_SITE_OTHER): Payer: MEDICARE | Admitting: Internal Medicine

## 2013-10-12 VITALS — BP 122/66 | HR 57 | Ht 70.0 in | Wt 208.0 lb

## 2013-10-12 DIAGNOSIS — I2510 Atherosclerotic heart disease of native coronary artery without angina pectoris: Secondary | ICD-10-CM

## 2013-10-12 DIAGNOSIS — I10 Essential (primary) hypertension: Secondary | ICD-10-CM

## 2013-10-12 DIAGNOSIS — I4891 Unspecified atrial fibrillation: Secondary | ICD-10-CM

## 2013-10-12 NOTE — Patient Instructions (Signed)
Your physician recommends that you continue on your current medications as directed. Please refer to the Current Medication list given to you today.  Your physician wants you to follow-up in: [place].   You will receive a reminder letter in the mail two months in advance. If you don't receive a letter, please call our office to schedule the follow-up appointment.

## 2013-10-12 NOTE — Progress Notes (Signed)
PCP: [name], MD  The patient presents today for routine electrophysiology followup.  Since last being seen in our clinic, the patient reports doing very well.  He is unaware of any further afib. No chest pain or shortness of breath. He reports postural dizziness at times which is unchanged from last visit.  He was just given a clean bill of health for his 5 year lung cancer dx. Having some issues with constipation and had a recent  abd scan which showed some gallstones. Also concerned his prostate has greatly enlarged and he may require surgery for one or the both in the near future.  Today, he denies symptoms of palpitations, chest pain, shortness of breath, orthopnea, PND, lower extremity edema, presyncope, syncope, or neurologic sequela.  The patient feels that he is tolerating medications without difficulties and is otherwise without complaint today.   Past Medical History  Diagnosis Date  . History of BPH     benign prostatic hypertrophy  . Paroxysmal atrial fibrillation     postoperative after lung surgery  . Osteoarthrosis, unspecified whether generalized or localized, lower leg     knee  . Labyrinthitis, unspecified   . Esophageal reflux   . Cerebrovascular disease     s/p prior stroke  . Unspecified essential hypertension   . Other and unspecified hyperlipidemia   . Depressive disorder, not elsewhere classified   . CAD (coronary artery disease)     s/p MI 90s, normal myoview 7/11  . Cancer     lung ca dx'd 2010  . Malignant neoplasm middle lobe, bronchus or lung     right lung. Dx 07/27/08, sp/p resection 7/10.    Past Surgical History  Procedure Laterality Date  . Esophagogastroduodenoscopy  05/14/04  . Bunionectomy  '89    left foot  . Inguinal hernia repair  '71    left  . Anthroscopic surgery  '10    right knee \  . Vat-rml lobectomy    . Right tkr  8/11  . Eye surgery      both eyes - 2011 [facility])    Current Outpatient Prescriptions  Medication  Sig Dispense Refill  . APAP-Isometheptene-Dichloral 325-65-100 MG per capsule Take 1 capsule by mouth 4 (four) times daily as needed.  30 capsule  3  . Calcium Carbonate (CALTRATE 600) 1500 MG TABS Take 1 tablet by mouth daily.        . cholecalciferol (VITAMIN D) 1000 UNITS tablet Take 1,000 Units by mouth daily.        . diazepam (VALIUM) 5 MG tablet Take 1 tablet (5 mg total) by mouth 2 (two) times daily.  180 tablet  3  . finasteride (PROSCAR) 5 MG tablet Take 1 tablet (5 mg total) by mouth daily.  90 tablet  3  . multivitamin-lutein (OCUVITE-LUTEIN) CAPS Take 1 capsule by mouth daily.      . nitroGLYCERIN (NITROSTAT) 0.4 MG SL tablet Place 1 tablet (0.4 mg total) under the tongue every 5 (five) minutes as needed.  25 tablet  3  . omeprazole (PRILOSEC) 20 MG capsule Take 1 capsule (20 mg total) by mouth daily. Every other day   90 capsule  1  . simvastatin (ZOCOR) 20 MG tablet Take 1 tablet (20 mg total) by mouth daily.  90 tablet  3  . terazosin (HYTRIN) 5 MG capsule Take 1 capsule (5 mg total) by mouth once.  90 capsule  3  . warfarin (COUMADIN) 5 MG tablet Take as  directed by anticoagulation clinic  90 tablet  3   No current facility-administered medications for this visit.    No Known Allergies  History   Social History  . Marital Status: Married    Spouse Name: N/A    Number of Children: N/A  . Years of Education: N/A   Occupational History  . railroad Counsellor     retired   Social History Main Topics  . Smoking status: Never Smoker   . Smokeless tobacco: Never Used  . Alcohol Use: No  . Drug Use: No  . Sexual Activity: Yes    Partners: Female   Other Topics Concern  . Not on file   Social History Narrative   HSG. Married '70. 1 son '75. Lives with wife independently in [place]. 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 [name]. 09/10/09.     Family History  Problem Relation Age of Onset  .  Diabetes insipidus      2nd kin   . Coronary artery disease Neg Hx   . Colon cancer Neg Hx   . Prostate cancer Neg Hx    Physical Exam: Filed Vitals:   10/12/13 1403  BP: 122/66  Pulse: 57  Height: 5\' 10"  (1.778 m)  Weight: 94.348 kg (208 lb)    GEN- The patient is well appearing, alert and oriented x 3 today.   Head- normocephalic, atraumatic Eyes-  Sclera clear, conjunctiva pink Ears- hearing intact Oropharynx- clear Neck- supple, no JVP Lymph- no cervical lymphadenopathy Lungs- Clear to ausculation bilaterally, normal work of breathing Heart- Regular rate and rhythm, no murmurs, rubs or gallops, PMI not laterally displaced GI- soft, NT, ND, + BS Extremities- no clubbing, cyanosis, or edema MS- no significant deformity or atrophy Skin- no rash or lesion Psych- euthymic mood, full affect Neuro- strength and sensation are intact  ekg today reveals sinus rhythm 57 bpm, otherwise normal ekg  Assessment and Plan:   1. afib  No symptomatic reoccurrence in several years. On warfarin due to previous stroke.  2. HTN Stable No change required today  3. HL Stable No change required today  4. CAD Asymptomatic No changes  5. Mild bradycardia Asymptomatic No changes  Return in one year.

## 2013-11-01 ENCOUNTER — Ambulatory Visit (INDEPENDENT_AMBULATORY_CARE_PROVIDER_SITE_OTHER): Payer: MEDICARE | Admitting: *Deleted

## 2013-11-01 DIAGNOSIS — G459 Transient cerebral ischemic attack, unspecified: Secondary | ICD-10-CM

## 2013-11-01 DIAGNOSIS — Z5181 Encounter for therapeutic drug level monitoring: Secondary | ICD-10-CM

## 2013-11-01 DIAGNOSIS — I4891 Unspecified atrial fibrillation: Secondary | ICD-10-CM

## 2013-11-01 LAB — POCT INR: INR: 2.5

## 2013-11-22 ENCOUNTER — Ambulatory Visit (INDEPENDENT_AMBULATORY_CARE_PROVIDER_SITE_OTHER): Payer: MEDICARE | Admitting: *Deleted

## 2013-11-22 DIAGNOSIS — Z23 Encounter for immunization: Secondary | ICD-10-CM

## 2013-11-29 ENCOUNTER — Ambulatory Visit (INDEPENDENT_AMBULATORY_CARE_PROVIDER_SITE_OTHER): Payer: MEDICARE | Admitting: *Deleted

## 2013-11-29 DIAGNOSIS — Z5181 Encounter for therapeutic drug level monitoring: Secondary | ICD-10-CM

## 2013-11-29 DIAGNOSIS — G459 Transient cerebral ischemic attack, unspecified: Secondary | ICD-10-CM

## 2013-11-29 DIAGNOSIS — I4891 Unspecified atrial fibrillation: Secondary | ICD-10-CM

## 2013-11-29 LAB — POCT INR: INR: 3.2

## 2013-12-04 ENCOUNTER — Ambulatory Visit (INDEPENDENT_AMBULATORY_CARE_PROVIDER_SITE_OTHER): Payer: MEDICARE | Admitting: Internal Medicine

## 2013-12-04 ENCOUNTER — Encounter: Payer: Self-pay | Admitting: Internal Medicine

## 2013-12-04 VITALS — BP 148/72 | HR 64 | Temp 98.1°F | Wt 208.0 lb

## 2013-12-04 DIAGNOSIS — C3491 Malignant neoplasm of unspecified part of right bronchus or lung: Secondary | ICD-10-CM

## 2013-12-04 DIAGNOSIS — I482 Chronic atrial fibrillation, unspecified: Secondary | ICD-10-CM

## 2013-12-04 DIAGNOSIS — G8929 Other chronic pain: Secondary | ICD-10-CM | POA: Insufficient documentation

## 2013-12-04 DIAGNOSIS — I2510 Atherosclerotic heart disease of native coronary artery without angina pectoris: Secondary | ICD-10-CM

## 2013-12-04 DIAGNOSIS — R1010 Upper abdominal pain, unspecified: Secondary | ICD-10-CM

## 2013-12-04 DIAGNOSIS — R1011 Right upper quadrant pain: Secondary | ICD-10-CM | POA: Insufficient documentation

## 2013-12-04 DIAGNOSIS — I10 Essential (primary) hypertension: Secondary | ICD-10-CM

## 2013-12-04 MED ORDER — TERAZOSIN HCL 5 MG PO CAPS: 5.0000 mg | ORAL_CAPSULE | Freq: Once | ORAL | Status: DC

## 2013-12-04 MED ORDER — SIMVASTATIN 20 MG PO TABS: 20.0000 mg | ORAL_TABLET | Freq: Every day | ORAL | Status: DC

## 2013-12-04 MED ORDER — WARFARIN SODIUM 5 MG PO TABS: ORAL_TABLET | ORAL | Status: DC

## 2013-12-04 MED ORDER — DIAZEPAM 5 MG PO TABS: 5.0000 mg | ORAL_TABLET | Freq: Two times a day (BID) | ORAL | Status: DC

## 2013-12-04 MED ORDER — FINASTERIDE 5 MG PO TABS: 5.0000 mg | ORAL_TABLET | Freq: Every day | ORAL | Status: DC

## 2013-12-04 NOTE — Progress Notes (Signed)
Subjective:     HPI  New pt - switching from Dr Bernard [name]  C/o abd pain R x weeks or months  The patient presents for a follow-up of  A fib, chronic hypertension, chronic dyslipidemia,TIA controlled with medicines  He has a large cyst on R kidney (CT, [place]) Dr Jasmine December - pt wants to switch to Dr Alinda Money   BP Readings from Last 3 Encounters:  12/04/13 148/72  10/12/13 122/66  10/05/13 127/63   Wt Readings from Last 3 Encounters:  12/04/13 208 lb (94.348 kg)  10/12/13 208 lb (94.348 kg)  10/05/13 209 lb 6.4 oz (94.983 kg)      Review of Systems  Constitutional: Negative for appetite change, fatigue and unexpected weight change.  HENT: Negative for congestion, nosebleeds, sneezing, sore throat and trouble swallowing.   Eyes: Negative for itching and visual disturbance.  Respiratory: Negative for cough.   Cardiovascular: Negative for chest pain, palpitations and leg swelling.  Gastrointestinal: Positive for abdominal pain. Negative for nausea, diarrhea, blood in stool and abdominal distention.  Genitourinary: Positive for frequency. Negative for hematuria.  Musculoskeletal: Negative for back pain, gait problem, joint swelling and neck pain.  Skin: Negative for rash.  Neurological: Negative for dizziness, tremors, speech difficulty and weakness.  Psychiatric/Behavioral: Negative for suicidal ideas, confusion, sleep disturbance, dysphoric mood and agitation. The patient is not nervous/anxious.        Objective:   Physical Exam  Constitutional: He is oriented to person, place, and time. He appears well-developed. No distress.  NAD  HENT:  Mouth/Throat: Oropharynx is clear and moist.  Eyes: Conjunctivae are normal. Pupils are equal, round, and reactive to light.  Neck: Normal range of motion. No JVD present. No thyromegaly present.  Cardiovascular: Normal rate, regular rhythm, normal heart sounds and intact distal pulses.  Exam reveals no gallop and no friction rub.   No  murmur heard. Pulmonary/Chest: Effort normal and breath sounds normal. No respiratory distress. He has no wheezes. He has no rales. He exhibits no tenderness.  Abdominal: Soft. Bowel sounds are normal. He exhibits no distension and no mass. There is no tenderness. There is no rebound and no guarding.  Musculoskeletal: Normal range of motion. He exhibits no edema and no tenderness.  Lymphadenopathy:    He has no cervical adenopathy.  Neurological: He is alert and oriented to person, place, and time. He has normal reflexes. No cranial nerve deficit. He exhibits normal muscle tone. He displays a negative Romberg sign. Coordination and gait normal.  No meningeal signs  Skin: Skin is warm and dry. No rash noted.  Psychiatric: He has a normal mood and affect. His behavior is normal. Judgment and thought content normal.    Lab Results  Component Value Date   WBC 6.9 10/03/2013   HGB 13.7 10/03/2013   HCT 42.6 10/03/2013   PLT 130* 10/03/2013   GLUCOSE 101* 10/03/2013   CHOL 138 03/22/2013   TRIG 95.0 03/22/2013   HDL 42.60 03/22/2013   LDLCALC 76 03/22/2013   ALT 13 10/03/2013   AST 21 10/03/2013   NA 139 10/03/2013   K 4.4 10/03/2013   CL 102 10/03/2013   CREATININE 1.16 10/03/2013   BUN 17 10/03/2013   CO2 28 10/03/2013   TSH 2.67 04/22/2009   PSA 2.06 04/22/2009   INR 3.2 11/29/2013   HGBA1C  Value: 6.0 (NOTE)  According to the ADA Clinical Practice Recommendations for 2011, when HbA1c is used as a screening test:   >=6.5%   Diagnostic of Diabetes Mellitus           (if abnormal result  is confirmed)  5.7-6.4%   Increased risk of developing Diabetes Mellitus  References:Diagnosis and Classification of Diabetes Mellitus,Diabetes [id number],5993,57(SVXBL 1):S62-S69 and Standards of Medical Care in         Diabetes - 2011,Diabetes [id number],0092,33  (Suppl 1):S11-S61.* 10/23/2009    Abd US IMPRESSION:  Simple cysts in right kidney, largest in the  upper pole measuring  7.4 x 7.7 x 7.8 cm. Prostatomegaly. Incidental finding of  gallstones.  Electronically Signed  By: Abelardo Diesel M.D.  On: 03/23/2013 15:30  A complex case    Assessment & Plan:

## 2013-12-04 NOTE — Assessment & Plan Note (Signed)
10/15 ?etiology - poss R renal cyst related. He also has GS's.  He will see Dr Alinda Money

## 2013-12-04 NOTE — Progress Notes (Signed)
Pre visit review using our clinic review tool, if applicable. No additional management support is needed unless otherwise documented below in the visit note. 

## 2013-12-04 NOTE — Assessment & Plan Note (Signed)
Continue with current prescription therapy as reflected on the Med list.  

## 2013-12-04 NOTE — Assessment & Plan Note (Signed)
2010 Dr Rayann Heman  Continue with current prescription therapy as reflected on the Med list.

## 2013-12-04 NOTE — Assessment & Plan Note (Signed)
Dr Earlie Server Right middle lobe, s/p lobectomy July '10 - stage 1A non-small cell adenocarcinoma Doing well

## 2013-12-05 ENCOUNTER — Telehealth: Payer: Self-pay | Admitting: Internal Medicine

## 2013-12-05 NOTE — Telephone Encounter (Signed)
emmi emailed °

## 2013-12-27 ENCOUNTER — Ambulatory Visit (INDEPENDENT_AMBULATORY_CARE_PROVIDER_SITE_OTHER): Payer: MEDICARE | Admitting: Family

## 2013-12-27 DIAGNOSIS — G459 Transient cerebral ischemic attack, unspecified: Secondary | ICD-10-CM

## 2013-12-27 DIAGNOSIS — Z5181 Encounter for therapeutic drug level monitoring: Secondary | ICD-10-CM

## 2013-12-27 DIAGNOSIS — I4891 Unspecified atrial fibrillation: Secondary | ICD-10-CM

## 2013-12-27 NOTE — Patient Instructions (Signed)
Hold coumadin today only Then continue to take 1 tablet daily except take 1/2 tablet on Sun/Tues/Thurs.  Re-check in 4 weeks.   Anticoagulation Dose Instructions as of 12/27/2013      Bernard [name] Tue Wed Thu Fri Sat   New Dose 2.5 mg 5 mg 2.5 mg 5 mg 2.5 mg 5 mg 5 mg    Description        Hold coumadin today only Then continue to take 1 tablet daily except take 1/2 tablet on Sun/Tues/Thurs.  Re-check in 4 weeks.

## 2014-01-24 ENCOUNTER — Encounter: Payer: Self-pay | Admitting: Family Medicine

## 2014-01-24 ENCOUNTER — Ambulatory Visit (INDEPENDENT_AMBULATORY_CARE_PROVIDER_SITE_OTHER): Payer: MEDICARE | Admitting: Family Medicine

## 2014-01-24 ENCOUNTER — Ambulatory Visit (INDEPENDENT_AMBULATORY_CARE_PROVIDER_SITE_OTHER): Payer: MEDICARE

## 2014-01-24 VITALS — BP 102/60 | HR 64 | Ht 70.0 in | Wt 208.0 lb

## 2014-01-24 DIAGNOSIS — Z5181 Encounter for therapeutic drug level monitoring: Secondary | ICD-10-CM

## 2014-01-24 DIAGNOSIS — I2510 Atherosclerotic heart disease of native coronary artery without angina pectoris: Secondary | ICD-10-CM

## 2014-01-24 DIAGNOSIS — S76312A Strain of muscle, fascia and tendon of the posterior muscle group at thigh level, left thigh, initial encounter: Secondary | ICD-10-CM

## 2014-01-24 DIAGNOSIS — I482 Chronic atrial fibrillation, unspecified: Secondary | ICD-10-CM

## 2014-01-24 LAB — POCT INR: INR: 2.6

## 2014-01-24 NOTE — Patient Instructions (Signed)
Good to see you Ice is your friend Exercises 3 times a week for the hamstring Wear the brace for support See me again in 3 weeks and if not perfect we will inject the knee again For your right knee I would recommend seeing Dr. Maureen Ralphs.

## 2014-01-24 NOTE — Progress Notes (Signed)
  [name] [address] [place], [place] [address] Phone: [phone] Subjective:      CC: Left knee  Pain.   GUR:[id number] Bernard [name] is a 80 y.o. male coming in for follow up of left knee pain. Patient was seen previously and did have a knee effusion with frank blood. Patient also has known severe arthritis of this knee. Patient on the contralateral side does have a knee replacement. Patient states that he was on the latter and unfortunately fell off. Patient tried to catch himself and cause significant pain in the knee. Denies any swelling at this point. States that hurts more on the posterior aspect of his knee. Denies any radiation down the leg. Denies any numbness. Patient has not tried any home modalities at this time. Denies any nighttime awakening.    Past medical history, social, surgical and family history all reviewed in electronic medical record.   Review of Systems: No headache, visual changes, nausea, vomiting, diarrhea, constipation, dizziness, abdominal pain, skin rash, fevers, chills, night sweats, weight loss, swollen lymph nodes, body aches, joint swelling, muscle aches, chest pain, shortness of breath, mood changes.   Objective Blood pressure 102/60, pulse 64, height 5\' 10"  (1.778 m), weight 208 lb (94.348 kg), SpO2 95 %.  General: No apparent distress alert and oriented x3 mood and affect normal, dressed appropriately.  HEENT: Pupils equal, extraocular movements intact  Respiratory: Patient's speak in full sentences and does not appear short of breath  Cardiovascular: No lower extremity edema, non tender, no erythema  Skin: Warm dry intact with no signs of infection or rash on extremities or on axial skeleton.  Abdomen: Soft nontender  Neuro: Cranial nerves II through XII are intact, neurovascularly intact in all extremities with 2+ DTRs and 2+ pulses.  Lymph: No lymphadenopathy of posterior or anterior cervical chain or axillae  bilaterally.  Gait antalgic gait noted still MSK:  Non tender with full range of motion and good stability and symmetric strength and tone of shoulders, elbows, wrist, hip,  and ankles bilaterally. Moderate osteophytic changes of multiple joints.  Knee: Left Inspection reveals no effusion  Continued mild medial joint line tenderness, mild pain over the insertion of the hamstrings. ROM  is lacking with extension to to 10 and flexion to 100 which is better than previous exam. Negative Mcmurray's, Apley's, and Thessalonian tests. Patellar glide moderate crepitus. Patellar and quadriceps tendons unremarkable. Hamstring and quadriceps strength is normal.  Contralateral knee has total arthroplasty with well-healed incision. Nontender on exam.  MSK US performed of: Left knee This study was ordered, performed, and interpreted by Charlann Boxer D.O.  Knee: All structures visualized. Patient's medial and lateral joint line do show some moderate narrowing Trace effusion still noted but no significant change from previous exam Patellar Tendon unremarkable on long and transverse views without effusion. No abnormality of prepatellar bursa. LCL and MCL unremarkable on long and transverse views. No abnormality of origin of medial or lateral head of the gastrocnemius. Mild hypoechoic changes at the insertion of the hamstrings but no true tear appreciated.  IMPRESSION: Moderate arthritis with questionable hamstring strain     Impression and Recommendations:     This case required medical decision making of moderate complexity.

## 2014-01-24 NOTE — Assessment & Plan Note (Signed)
I believe the patient's pain is secondary to morbid hamstring strain than true knee injury. Patient does have the underlying arthritis that can contribute to some of the pain. Ultrasound today did not show any significant effusion. Patient did state that he felt that there was potential some instability of the knee. Patient given a brace today for more stability. We discussed icing regimen and given home exercises concentrating more on hamstring injury. Patient will try these interventions and come back and see me again in 3 weeks.  Spent greater than 25 minutes with patient face-to-face and had greater than 50% of counseling including as described above in assessment and plan.

## 2014-02-13 ENCOUNTER — Ambulatory Visit (INDEPENDENT_AMBULATORY_CARE_PROVIDER_SITE_OTHER): Payer: MEDICARE | Admitting: Family Medicine

## 2014-02-13 ENCOUNTER — Encounter: Payer: Self-pay | Admitting: Family Medicine

## 2014-02-13 ENCOUNTER — Ambulatory Visit (INDEPENDENT_AMBULATORY_CARE_PROVIDER_SITE_OTHER)
Admission: RE | Admit: 2014-02-13 | Discharge: 2014-02-13 | Disposition: A | Discharge status: Home / Self Care | Payer: MEDICARE | Source: Ambulatory Visit | Attending: Family Medicine | Admitting: Family Medicine

## 2014-02-13 VITALS — BP 120/72 | HR 63 | Ht 70.0 in | Wt 212.0 lb

## 2014-02-13 DIAGNOSIS — IMO0002 Reserved for concepts with insufficient information to code with codable children: Secondary | ICD-10-CM

## 2014-02-13 DIAGNOSIS — M25562 Pain in left knee: Secondary | ICD-10-CM

## 2014-02-13 DIAGNOSIS — M179 Osteoarthritis of knee, unspecified: Secondary | ICD-10-CM

## 2014-02-13 DIAGNOSIS — I2510 Atherosclerotic heart disease of native coronary artery without angina pectoris: Secondary | ICD-10-CM

## 2014-02-13 DIAGNOSIS — M1710 Unilateral primary osteoarthritis, unspecified knee: Secondary | ICD-10-CM

## 2014-02-13 NOTE — Progress Notes (Signed)
[name] [address], [place] [address] Phone: [phone] Subjective:      CC: Left knee  Pain.   Bernard [name] is a 80 y.o. male coming in for follow up of left knee pain. Patient was seen previously and did have a knee effusion with frank blood. Patient also has known severe arthritis of this knee. Patient on the contralateral side does have a knee replacement. Patient's states that overall he continues to give him discomfort. Patient states that he feels unstable. Patient is wearing a brace that is helpful. Denies any radiation of pain. Seems to be getting worse though. Patient would like to avoid a joint replacement if possible. Patient also has other health conditions including a renal cyst and may need further intervention for this.    Past medical history, social, surgical and family history all reviewed in electronic medical record.   Review of Systems: No headache, visual changes, nausea, vomiting, diarrhea, constipation, dizziness, abdominal pain, skin rash, fevers, chills, night sweats, weight loss, swollen lymph nodes, body aches, joint swelling, muscle aches, chest pain, shortness of breath, mood changes.   Objective Blood pressure 120/72, pulse 63, height 5\' 10"  (1.778 m), weight 212 lb (96.163 kg), SpO2 95 %.  General: No apparent distress alert and oriented x3 mood and affect normal, dressed appropriately.  HEENT: Pupils equal, extraocular movements intact  Respiratory: Patient's speak in full sentences and does not appear short of breath  Cardiovascular: No lower extremity edema, non tender, no erythema  Skin: Warm dry intact with no signs of infection or rash on extremities or on axial skeleton.  Abdomen: Soft nontender  Neuro: Cranial nerves II through XII are intact, neurovascularly intact in all extremities with 2+ DTRs and 2+ pulses.  Lymph: No lymphadenopathy of posterior or anterior cervical chain or  axillae bilaterally.  Gait antalgic gait noted still MSK:  Non tender with full range of motion and good stability and symmetric strength and tone of shoulders, elbows, wrist, hip,  and ankles bilaterally. Moderate osteophytic changes of multiple joints.  Knee: Left Inspection reveals no effusion  Continued to deal joint line tenderness Patient does have full range of motion but does have tenderness at the extreme positions. Negative Mcmurray's, Apley's, and Thessalonian tests. Patellar glide moderate crepitus. Patellar and quadriceps tendons unremarkable. Hamstring and quadriceps strength is normal.  Contralateral knee has total arthroplasty with well-healed incision. Nontender on exam. Ultrasound guidance left knee Procedure: Real-time Ultrasound Guided Injection of left knee Device: GE Logiq E  Ultrasound guided injection is preferred based studies that show increased duration, increased effect, greater accuracy, decreased procedural pain, increased response rate, and decreased cost with ultrasound guided versus blind injection.  Verbal informed consent obtained.  Time-out conducted.  Noted no overlying erythema, induration, or other signs of local infection.  Skin prepped in a sterile fashion.  Local anesthesia: Topical Ethyl chloride.  With sterile technique and under real time ultrasound guidance: With a 22-gauge 2 inch needle patient was injected with 4 cc of 0.5% Marcaine and 1 cc of Kenalog 40 mg/dL. This was from a superior lateral approach.  Completed without difficulty  Pain immediately resolved suggesting accurate placement of the medication.  Advised to call if fevers/chills, erythema, induration, drainage, or persistent bleeding.  Images permanently stored and available for review in the ultrasound unit.  Impression: Technically successful ultrasound guided injection.    Impression and Recommendations:     This case required medical decision  making of moderate  complexity.

## 2014-02-13 NOTE — Assessment & Plan Note (Signed)
Patient does have osteophytes arthritis of the left knee that seems to be severe likely bone-on-bone. X-rays will be taken today with patient having a history of the renal cyst recently. Patient was given an injection today and tolerated the procedure very well. Patient did have a hemarthrosis previously and may have had a ligamentous injury but secondary to the amount of arthritis this would not change management with patient is not wanting surgical intervention. We discussed the possibility of viscous supplementation of this does not seem to last long. Patient encouraged to continue home exercises and the bracing. Patient will come back and see me again in 3-4 weeks for further evaluation and treatment.  Spent greater than 25 minutes with patient face-to-face and had greater than 50% of counseling including as described above in assessment and plan.

## 2014-02-13 NOTE — Patient Instructions (Signed)
Good to see you We didi injection today Ice at the end of the day for 10-20 minutes Wear brace with activity Xrays downstairs today.  See me in 3-4 weeks if still in pain can do gel injections.

## 2014-02-21 ENCOUNTER — Ambulatory Visit (INDEPENDENT_AMBULATORY_CARE_PROVIDER_SITE_OTHER): Payer: MEDICARE | Admitting: Family Medicine

## 2014-02-21 DIAGNOSIS — G459 Transient cerebral ischemic attack, unspecified: Secondary | ICD-10-CM | POA: Diagnosis not present

## 2014-02-21 DIAGNOSIS — Z5181 Encounter for therapeutic drug level monitoring: Secondary | ICD-10-CM

## 2014-02-21 DIAGNOSIS — I4891 Unspecified atrial fibrillation: Secondary | ICD-10-CM

## 2014-02-21 LAB — POCT INR: INR: 2.5

## 2014-03-08 ENCOUNTER — Ambulatory Visit (INDEPENDENT_AMBULATORY_CARE_PROVIDER_SITE_OTHER): Payer: MEDICARE | Admitting: Internal Medicine

## 2014-03-08 ENCOUNTER — Encounter: Payer: Self-pay | Admitting: Internal Medicine

## 2014-03-08 ENCOUNTER — Other Ambulatory Visit (INDEPENDENT_AMBULATORY_CARE_PROVIDER_SITE_OTHER): Payer: MEDICARE

## 2014-03-08 VITALS — BP 130/74 | HR 64 | Temp 97.9°F | Wt 209.0 lb

## 2014-03-08 DIAGNOSIS — N281 Cyst of kidney, acquired: Secondary | ICD-10-CM

## 2014-03-08 DIAGNOSIS — Q6100 Congenital renal cyst, unspecified: Secondary | ICD-10-CM

## 2014-03-08 DIAGNOSIS — I10 Essential (primary) hypertension: Secondary | ICD-10-CM

## 2014-03-08 DIAGNOSIS — K8020 Calculus of gallbladder without cholecystitis without obstruction: Secondary | ICD-10-CM | POA: Insufficient documentation

## 2014-03-08 DIAGNOSIS — R109 Unspecified abdominal pain: Secondary | ICD-10-CM

## 2014-03-08 DIAGNOSIS — E785 Hyperlipidemia, unspecified: Secondary | ICD-10-CM

## 2014-03-08 DIAGNOSIS — R1011 Right upper quadrant pain: Secondary | ICD-10-CM

## 2014-03-08 DIAGNOSIS — G8929 Other chronic pain: Secondary | ICD-10-CM

## 2014-03-08 LAB — BASIC METABOLIC PANEL
BUN: 17 mg/dL (ref 6–23)
CHLORIDE: 105 meq/L (ref 96–112)
CO2: 28 mEq/L (ref 19–32)
Calcium: 8.9 mg/dL (ref 8.4–10.5)
Creatinine, Ser: 1.08 mg/dL (ref 0.40–1.50)
GFR: 70.08 mL/min (ref >60.00)
GLUCOSE: 100 mg/dL — AB (ref 70–99)
Potassium: 4.3 mEq/L (ref 3.5–5.1)
Sodium: 138 mEq/L (ref 135–145)

## 2014-03-08 LAB — LIPID PANEL
CHOLESTEROL: 132 mg/dL (ref 0–200)
HDL: 44.3 mg/dL (ref >39.00)
LDL Cholesterol: 75 mg/dL (ref 0–99)
NonHDL: 87.7
TRIGLYCERIDES: 66 mg/dL (ref 0.0–149.0)
Total CHOL/HDL Ratio: 3
VLDL: 13.2 mg/dL (ref 0.0–40.0)

## 2014-03-08 LAB — HEPATIC FUNCTION PANEL
ALT: 17 U/L (ref 0–53)
AST: 21 U/L (ref 0–37)
Albumin: 4.1 g/dL (ref 3.5–5.2)
Alkaline Phosphatase: 39 U/L (ref 39–117)
Bilirubin, Direct: 0.2 mg/dL (ref 0.0–0.3)
Total Bilirubin: 0.6 mg/dL (ref 0.2–1.2)
Total Protein: 7.1 g/dL (ref 6.0–8.3)

## 2014-03-08 NOTE — Assessment & Plan Note (Signed)
2015 no typical symptoms

## 2014-03-08 NOTE — Assessment & Plan Note (Signed)
Abd US IMPRESSION:  Simple cysts in right kidney, largest in the upper pole measuring  7.4 x 7.7 x 7.8 cm. Prostatomegaly. Incidental finding of  gallstones.  Electronically Signed  By: Abelardo Diesel M.D.  On: 03/23/2013 15:30   Urol ref due to pain Dr Alinda Money

## 2014-03-08 NOTE — Assessment & Plan Note (Signed)
Labs  Continue with current prescription therapy as reflected on the Med list. BP Readings from Last 3 Encounters:  03/08/14 130/74  02/13/14 120/72  01/24/14 102/60

## 2014-03-08 NOTE — Assessment & Plan Note (Signed)
Continue with current prescription therapy as reflected on the Med list.  

## 2014-03-08 NOTE — Assessment & Plan Note (Signed)
2015 R renal cyst vs other no typical GS symptoms Urol ref

## 2014-03-08 NOTE — Progress Notes (Signed)
Subjective:     HPI  F/u R sided discomfort, R renal cyst  The patient presents for a follow-up of  A fib, chronic hypertension, chronic dyslipidemia,TIA controlled with medicines  He has a large cyst on R kidney (CT, [place]) Dr Jasmine December - pt wants to switch to Dr Alinda Money   BP Readings from Last 3 Encounters:  03/08/14 130/74  02/13/14 120/72  01/24/14 102/60   Wt Readings from Last 3 Encounters:  03/08/14 209 lb (94.802 kg)  02/13/14 212 lb (96.163 kg)  01/24/14 208 lb (94.348 kg)      Review of Systems  Constitutional: Negative for appetite change, fatigue and unexpected weight change.  HENT: Negative for congestion, nosebleeds, sneezing, sore throat and trouble swallowing.   Eyes: Negative for itching and visual disturbance.  Respiratory: Negative for cough.   Cardiovascular: Negative for chest pain, palpitations and leg swelling.  Gastrointestinal: Positive for abdominal pain. Negative for nausea, diarrhea, blood in stool and abdominal distention.  Genitourinary: Positive for frequency. Negative for hematuria.  Musculoskeletal: Negative for back pain, joint swelling, gait problem and neck pain.  Skin: Negative for rash.  Neurological: Negative for dizziness, tremors, speech difficulty and weakness.  Psychiatric/Behavioral: Negative for suicidal ideas, confusion, sleep disturbance, dysphoric mood and agitation. The patient is not nervous/anxious.        Objective:   Physical Exam  Constitutional: He is oriented to person, place, and time. He appears well-developed. No distress.  NAD  HENT:  Mouth/Throat: Oropharynx is clear and moist.  Eyes: Conjunctivae are normal. Pupils are equal, round, and reactive to light.  Neck: Normal range of motion. No JVD present. No thyromegaly present.  Cardiovascular: Normal rate, regular rhythm, normal heart sounds and intact distal pulses.  Exam reveals no gallop and no friction rub.   No murmur heard. Pulmonary/Chest: Effort  normal and breath sounds normal. No respiratory distress. He has no wheezes. He has no rales. He exhibits no tenderness.  Abdominal: Soft. Bowel sounds are normal. He exhibits no distension and no mass. There is no tenderness. There is no rebound and no guarding.  Musculoskeletal: Normal range of motion. He exhibits no edema or tenderness.  Lymphadenopathy:    He has no cervical adenopathy.  Neurological: He is alert and oriented to person, place, and time. He has normal reflexes. No cranial nerve deficit. He exhibits normal muscle tone. He displays a negative Romberg sign. Coordination and gait normal.  No meningeal signs  Skin: Skin is warm and dry. No rash noted.  Psychiatric: He has a normal mood and affect. His behavior is normal. Judgment and thought content normal.  abd NT R flank tapping is sensitive  Lab Results  Component Value Date   WBC 6.9 10/03/2013   HGB 13.7 10/03/2013   HCT 42.6 10/03/2013   PLT 130* 10/03/2013   GLUCOSE 101* 10/03/2013   CHOL 138 03/22/2013   TRIG 95.0 03/22/2013   HDL 42.60 03/22/2013   LDLCALC 76 03/22/2013   ALT 13 10/03/2013   AST 21 10/03/2013   NA 139 10/03/2013   K 4.4 10/03/2013   CL 102 10/03/2013   CREATININE 1.16 10/03/2013   BUN 17 10/03/2013   CO2 28 10/03/2013   TSH 2.67 04/22/2009   PSA 2.06 04/22/2009   INR 2.5 02/21/2014   HGBA1C * 10/23/2009    6.0 (NOTE)  According to the ADA Clinical Practice Recommendations for 2011, when HbA1c is used as a screening test:   >=6.5%   Diagnostic of Diabetes Mellitus           (if abnormal result  is confirmed)  5.7-6.4%   Increased risk of developing Diabetes Mellitus  References:Diagnosis and Classification of Diabetes Mellitus,Diabetes IZXY,8118,86(LRJPV 1):S62-S69 and Standards of Medical Care in         Diabetes - 2011,Diabetes GKKD,5947,07  (Suppl 1):S11-S61.    Abd US IMPRESSION:  Simple cysts in right kidney,  largest in the upper pole measuring  7.4 x 7.7 x 7.8 cm. Prostatomegaly. Incidental finding of  gallstones.  Electronically Signed  By: Abelardo Diesel M.D.  On: 03/23/2013 15:30      Assessment & Plan:  Patient ID: Bernard [name], male   DOB: [birth year], 81 y.o.   MRN: [id number]

## 2014-03-08 NOTE — Progress Notes (Signed)
Pre visit review using our clinic review tool, if applicable. No additional management support is needed unless otherwise documented below in the visit note. 

## 2014-03-13 ENCOUNTER — Ambulatory Visit (INDEPENDENT_AMBULATORY_CARE_PROVIDER_SITE_OTHER): Payer: MEDICARE | Admitting: Family Medicine

## 2014-03-13 ENCOUNTER — Encounter: Payer: Self-pay | Admitting: Family Medicine

## 2014-03-13 VITALS — BP 120/70 | HR 61 | Ht 70.0 in | Wt 208.0 lb

## 2014-03-13 DIAGNOSIS — M1710 Unilateral primary osteoarthritis, unspecified knee: Secondary | ICD-10-CM

## 2014-03-13 DIAGNOSIS — IMO0002 Reserved for concepts with insufficient information to code with codable children: Secondary | ICD-10-CM

## 2014-03-13 DIAGNOSIS — M179 Osteoarthritis of knee, unspecified: Secondary | ICD-10-CM

## 2014-03-13 NOTE — Progress Notes (Signed)
[name] [address] [place], [place] [address] Phone: [phone] Subjective:      CC: Left knee  Pain.   HUT:[id number] Bernard [name] is a 81 y.o. male coming in for follow up of left knee pain. Patient was seen previously and did have a knee effusion with frank blood. Patient also has known severe arthritis of this knee. Patient on the contralateral side does have a knee replacement. Patient was given an injection in his left knee previously. Patient states that this did help somewhat and is approximately 10% better. Patient still though when he was walking a significant amount of time had more pain. Patient states that it is starting to affect his hip as well. Patient wants to avoid having any other joint starting to give him trouble. Patient is wondering what else could be done of this knee. Patient still would like to avoid replacement if possible.    Past medical history, social, surgical and family history all reviewed in electronic medical record.   Review of Systems: No headache, visual changes, nausea, vomiting, diarrhea, constipation, dizziness, abdominal pain, skin rash, fevers, chills, night sweats, weight loss, swollen lymph nodes, body aches, joint swelling, muscle aches, chest pain, shortness of breath, mood changes.   Objective Blood pressure 120/70, pulse 61, height 5\' 10"  (1.778 m), weight 208 lb (94.348 kg), SpO2 94 %.  General: No apparent distress alert and oriented x3 mood and affect normal, dressed appropriately.  HEENT: Pupils equal, extraocular movements intact  Respiratory: Patient's speak in full sentences and does not appear short of breath  Cardiovascular: No lower extremity edema, non tender, no erythema  Skin: Warm dry intact with no signs of infection or rash on extremities or on axial skeleton.  Abdomen: Soft nontender  Neuro: Cranial nerves II through XII are intact, neurovascularly intact in all extremities with 2+  DTRs and 2+ pulses.  Lymph: No lymphadenopathy of posterior or anterior cervical chain or axillae bilaterally.  Gait antalgic gait noted still MSK:  Non tender with full range of motion and good stability and symmetric strength and tone of shoulders, elbows, wrist, hip,  and ankles bilaterally. Moderate osteophytic changes of multiple joints.  Knee: Left Inspection reveals no effusion  Tender to palpation over the medial joint line Patient does have full range of motion but does have tenderness at the extreme positions. Negative Mcmurray's, Apley's, and Thessalonian tests. Patellar glide moderate crepitus. Patellar and quadriceps tendons unremarkable. Hamstring and quadriceps strength is normal.  Contralateral knee has total arthroplasty with well-healed incision. Nontender on exam.  Procedure: Real-time Ultrasound Guided Injection of left knee Device: GE Logiq E  Ultrasound guided injection is preferred based studies that show increased duration, increased effect, greater accuracy, decreased procedural pain, increased response rate, and decreased cost with ultrasound guided versus blind injection.  Verbal informed consent obtained.  Time-out conducted.  Noted no overlying erythema, induration, or other signs of local infection.  Skin prepped in a sterile fashion.  Local anesthesia: Topical Ethyl chloride.  With sterile technique and under real time ultrasound guidance: With a 22-gauge 2 inch needle patient was injected with16 mg/2.5 mL of Orthovisc (sodium hyaluronate) in a prefilled syringe was injected easily into the knee through a 22-gauge needle. This was from a superior lateral approach.  Completed without difficulty  Pain immediately resolved suggesting accurate placement of the medication.  Advised to call if fevers/chills, erythema, induration, drainage, or persistent bleeding.  Images permanently stored and available for  review in the ultrasound unit.  Impression: Technically  successful ultrasound guided injection.    Impression and Recommendations:     This case required medical decision making of moderate complexity.

## 2014-03-13 NOTE — Patient Instructions (Signed)
Good to see you In 6 hours ice that knee.  Continue the brace and exercises See me again in 1 week.

## 2014-03-13 NOTE — Assessment & Plan Note (Signed)
Patient was started on Orthovisc injections today. We discussed icing regimen home exercises as well as continuing the bracing. Patient has done formal physical therapy before. Patient has done viscous supplementation greater than 5 years ago with minimal benefit. Patient continued to try to stay active. Patient come back in 1 week for further evaluation. At that point we will do the second in a series of 3 or 4 injections of Orthovisc.

## 2014-03-21 ENCOUNTER — Ambulatory Visit (INDEPENDENT_AMBULATORY_CARE_PROVIDER_SITE_OTHER): Payer: MEDICARE | Admitting: Family Medicine

## 2014-03-21 ENCOUNTER — Ambulatory Visit (INDEPENDENT_AMBULATORY_CARE_PROVIDER_SITE_OTHER): Payer: MEDICARE

## 2014-03-21 ENCOUNTER — Encounter: Payer: Self-pay | Admitting: Family Medicine

## 2014-03-21 VITALS — BP 112/70 | HR 64 | Ht 70.0 in | Wt 208.0 lb

## 2014-03-21 DIAGNOSIS — M179 Osteoarthritis of knee, unspecified: Secondary | ICD-10-CM

## 2014-03-21 DIAGNOSIS — M1710 Unilateral primary osteoarthritis, unspecified knee: Secondary | ICD-10-CM

## 2014-03-21 DIAGNOSIS — IMO0002 Reserved for concepts with insufficient information to code with codable children: Secondary | ICD-10-CM

## 2014-03-21 DIAGNOSIS — Z5181 Encounter for therapeutic drug level monitoring: Secondary | ICD-10-CM

## 2014-03-21 LAB — POCT INR: INR: 2.5

## 2014-03-21 NOTE — Progress Notes (Signed)
Pre visit review using our clinic review tool, if applicable. No additional management support is needed unless otherwise documented below in the visit note. 

## 2014-03-21 NOTE — Assessment & Plan Note (Signed)
Patient was given second in a series of 4 injection of the Orthovisc today. Patient tolerated the procedure very well. We discussed the icing regimen and the home exercises As well as the bracing. Patient will come back in one week for the third of the for injections.

## 2014-03-21 NOTE — Patient Instructions (Signed)
Verbal instructions given

## 2014-03-21 NOTE — Progress Notes (Signed)
[name] [address], [place] [address] Phone: [phone] Subjective:      CC: Left knee  Pain.   RDE:YCXKGYJEHU Bernard [name] is a 81 y.o. male coming in for follow up of left knee pain. Patient was seen previously and did have a knee effusion with frank blood. Patient also has known severe arthritis of this knee. Patient on the contralateral side does have a knee replacement. Patient had failed all other conservative therapy for his knee and started with the Orthovisc injections last week. Patient states that he already feels approximately 20% better. Continues to wear the brace. Patient has not been walking secondary to the weather which may have also help some of the pain. Patient though states that it has not swollen again which she was having previously.    Past medical history, social, surgical and family history all reviewed in electronic medical record.   Review of Systems: No headache, visual changes, nausea, vomiting, diarrhea, constipation, dizziness, abdominal pain, skin rash, fevers, chills, night sweats, weight loss, swollen lymph nodes, body aches, joint swelling, muscle aches, chest pain, shortness of breath, mood changes.   Objective Blood pressure 112/70, pulse 64, height 5\' 10"  (1.778 m), weight 208 lb (94.348 kg), SpO2 95 %.  General: No apparent distress alert and oriented x3 mood and affect normal, dressed appropriately.  HEENT: Pupils equal, extraocular movements intact  Respiratory: Patient's speak in full sentences and does not appear short of breath  Cardiovascular: No lower extremity edema, non tender, no erythema  Skin: Warm dry intact with no signs of infection or rash on extremities or on axial skeleton.  Abdomen: Soft nontender  Neuro: Cranial nerves II through XII are intact, neurovascularly intact in all extremities with 2+ DTRs and 2+ pulses.  Lymph: No lymphadenopathy of posterior or anterior cervical chain or  axillae bilaterally.  Gait antalgic gait noted still MSK:  Non tender with full range of motion and good stability and symmetric strength and tone of shoulders, elbows, wrist, hip,  and ankles bilaterally. Moderate osteophytic changes of multiple joints.  Knee: Left Inspection reveals no effusion  Tender to palpation over the medial joint line Patient does have full range of motion but does have tenderness at the extreme positions. Negative Mcmurray's, Apley's, and Thessalonian tests. Patellar glide moderate crepitus. Patellar and quadriceps tendons unremarkable. Hamstring and quadriceps strength is normal.  Contralateral knee has total arthroplasty with well-healed incision. Nontender on exam.  Procedure: Real-time Ultrasound Guided Injection of left knee Device: GE Logiq E  Ultrasound guided injection is preferred based studies that show increased duration, increased effect, greater accuracy, decreased procedural pain, increased response rate, and decreased cost with ultrasound guided versus blind injection.  Verbal informed consent obtained.  Time-out conducted.  Noted no overlying erythema, induration, or other signs of local infection.  Skin prepped in a sterile fashion.  Local anesthesia: Topical Ethyl chloride.  With sterile technique and under real time ultrasound guidance: With a 22-gauge 2 inch needle patient was injected with16 mg/2.5 mL of Orthovisc (sodium hyaluronate) in a prefilled syringe was injected easily into the knee through a 22-gauge needle. This was from a superior lateral approach.  Completed without difficulty  Pain immediately resolved suggesting accurate placement of the medication.  Advised to call if fevers/chills, erythema, induration, drainage, or persistent bleeding.  Images permanently stored and available for review in the ultrasound unit.  Impression: Technically successful ultrasound guided injection.    Impression and Recommendations:  This  case required medical decision making of moderate complexity.

## 2014-03-28 ENCOUNTER — Encounter: Payer: Self-pay | Admitting: Family Medicine

## 2014-03-28 ENCOUNTER — Ambulatory Visit (INDEPENDENT_AMBULATORY_CARE_PROVIDER_SITE_OTHER): Payer: MEDICARE | Admitting: Family Medicine

## 2014-03-28 VITALS — BP 118/64 | HR 65 | Ht 70.0 in | Wt 212.0 lb

## 2014-03-28 DIAGNOSIS — M1710 Unilateral primary osteoarthritis, unspecified knee: Secondary | ICD-10-CM

## 2014-03-28 DIAGNOSIS — M179 Osteoarthritis of knee, unspecified: Secondary | ICD-10-CM | POA: Diagnosis not present

## 2014-03-28 DIAGNOSIS — IMO0002 Reserved for concepts with insufficient information to code with codable children: Secondary | ICD-10-CM

## 2014-03-28 NOTE — Assessment & Plan Note (Signed)
Patient has made improvement. Patient not have any swelling today. Patient did respond well to the injection. Patient and will come back and see me again in 1 week for fourth and final injection.

## 2014-03-28 NOTE — Patient Instructions (Signed)
Good to see you Ice is your friend Continue the brace See you Thursday!

## 2014-03-28 NOTE — Progress Notes (Signed)
[name] [address], [place] [address] Phone: [phone] Subjective:      CC: Left knee  Pain.   IRS:[id number] Bernard [name] is a 81 y.o. male coming in for follow up of left knee pain. Patient was seen previously and did have a knee effusion with frank blood. Patient also has known severe arthritis of this knee. Patient on the contralateral side does have a knee replacement. Patient had failed all other conservative therapy for his knee and started with the Orthovisc injections.   Patient is already had 2 injections of the series of 4. Patient continues to notice some mild improvement. Patient though did increase his activity which did give him some mild soreness. Denies any new symptoms.    Past medical history, social, surgical and family history all reviewed in electronic medical record.   Review of Systems: No headache, visual changes, nausea, vomiting, diarrhea, constipation, dizziness, abdominal pain, skin rash, fevers, chills, night sweats, weight loss, swollen lymph nodes, body aches, joint swelling, muscle aches, chest pain, shortness of breath, mood changes.   Objective Blood pressure 118/64, pulse 65, height 5\' 10"  (1.778 m), weight 212 lb (96.163 kg), SpO2 94 %.  General: No apparent distress alert and oriented x3 mood and affect normal, dressed appropriately.  HEENT: Pupils equal, extraocular movements intact  Respiratory: Patient's speak in full sentences and does not appear short of breath  Cardiovascular: No lower extremity edema, non tender, no erythema  Skin: Warm dry intact with no signs of infection or rash on extremities or on axial skeleton.  Abdomen: Soft nontender  Neuro: Cranial nerves II through XII are intact, neurovascularly intact in all extremities with 2+ DTRs and 2+ pulses.  Lymph: No lymphadenopathy of posterior or anterior cervical chain or axillae bilaterally.  Gait antalgic gait noted still MSK:  Non  tender with full range of motion and good stability and symmetric strength and tone of shoulders, elbows, wrist, hip,  and ankles bilaterally. Moderate osteophytic changes of multiple joints.  Knee: Left Inspection reveals no effusion  Tender to palpation over the medial joint line Patient does have full range of motion but does have tenderness at the extreme positions. Negative Mcmurray's, Apley's, and Thessalonian tests. Patellar glide moderate crepitus. Patellar and quadriceps tendons unremarkable. Hamstring and quadriceps strength is normal.  Contralateral knee has total arthroplasty with well-healed incision. Nontender on exam.  Procedure: Real-time Ultrasound Guided Injection of left knee Device: GE Logiq E  Ultrasound guided injection is preferred based studies that show increased duration, increased effect, greater accuracy, decreased procedural pain, increased response rate, and decreased cost with ultrasound guided versus blind injection.  Verbal informed consent obtained.  Time-out conducted.  Noted no overlying erythema, induration, or other signs of local infection.  Skin prepped in a sterile fashion.  Local anesthesia: Topical Ethyl chloride.  With sterile technique and under real time ultrasound guidance: With a 22-gauge 2 inch needle patient was injected with15 mg/2.5 mL of Orthovisc (sodium hyaluronate) in a prefilled syringe was injected easily into the knee through a 22-gauge needle. This was from a superior lateral approach.  Completed without difficulty  Pain immediately resolved suggesting accurate placement of the medication.  Advised to call if fevers/chills, erythema, induration, drainage, or persistent bleeding.  Images permanently stored and available for review in the ultrasound unit.  Impression: Technically successful ultrasound guided injection.    Impression and Recommendations:     This case required medical decision making of  moderate  complexity.

## 2014-03-28 NOTE — Progress Notes (Signed)
Pre visit review using our clinic review tool, if applicable. No additional management support is needed unless otherwise documented below in the visit note. 

## 2014-04-05 ENCOUNTER — Ambulatory Visit (INDEPENDENT_AMBULATORY_CARE_PROVIDER_SITE_OTHER): Payer: MEDICARE | Admitting: Family Medicine

## 2014-04-05 ENCOUNTER — Encounter: Payer: Self-pay | Admitting: Family Medicine

## 2014-04-05 VITALS — BP 100/62 | HR 65 | Ht 70.0 in | Wt 212.0 lb

## 2014-04-05 DIAGNOSIS — IMO0002 Reserved for concepts with insufficient information to code with codable children: Secondary | ICD-10-CM

## 2014-04-05 DIAGNOSIS — M1710 Unilateral primary osteoarthritis, unspecified knee: Secondary | ICD-10-CM

## 2014-04-05 DIAGNOSIS — M179 Osteoarthritis of knee, unspecified: Secondary | ICD-10-CM | POA: Diagnosis not present

## 2014-04-05 NOTE — Assessment & Plan Note (Signed)
Patient was given fourth and final injection of the Synvisc series today. Patient will continue conservative therapy and follow-up in one month.

## 2014-04-05 NOTE — Progress Notes (Signed)
  [name] [address], [place] [address] Phone: [phone] Subjective:      CC: Left knee  Pain.   MPN:[id number] Bernard [name] is a 81 y.o. male coming in for follow up of left knee pain. Patient was seen previously and did have a knee effusion with frank blood. Patient also has known severe arthritis of this knee. Patient on the contralateral side does have a knee replacement. Patient had failed all other conservative therapy for his knee and started with the Orthovisc injections.   Patient is already had 3 injections of the series of 4. Patient continues to improve. Less pain almost daily.     Past medical history, social, surgical and family history all reviewed in electronic medical record.   Review of Systems: No headache, visual changes, nausea, vomiting, diarrhea, constipation, dizziness, abdominal pain, skin rash, fevers, chills, night sweats, weight loss, swollen lymph nodes, body aches, joint swelling, muscle aches, chest pain, shortness of breath, mood changes.   Objective Blood pressure 100/62, pulse 65, height 5\' 10"  (1.778 m), weight 212 lb (96.163 kg), SpO2 96 %.  General: No apparent distress alert and oriented x3 mood and affect normal, dressed appropriately.  HEENT: Pupils equal, extraocular movements intact  Respiratory: Patient's speak in full sentences and does not appear short of breath  Cardiovascular: No lower extremity edema, non tender, no erythema  Skin: Warm dry intact with no signs of infection or rash on extremities or on axial skeleton.  Abdomen: Soft nontender  Neuro: Cranial nerves II through XII are intact, neurovascularly intact in all extremities with 2+ DTRs and 2+ pulses.  Lymph: No lymphadenopathy of posterior or anterior cervical chain or axillae bilaterally.  Gait antalgic gait noted still MSK:  Non tender with full range of motion and good stability and symmetric strength and tone of shoulders, elbows,  wrist, hip,  and ankles bilaterally. Moderate osteophytic changes of multiple joints.  Knee: Left Inspection reveals no effusion  Tender to palpation over the medial joint line Patient does have full range of motion but does have tenderness at the extreme positions. Negative Mcmurray's, Apley's, and Thessalonian tests. Patellar glide moderate crepitus. Patellar and quadriceps tendons unremarkable. Hamstring and quadriceps strength is normal.  Contralateral knee has total arthroplasty with well-healed incision. Nontender on exam.  Procedure: Real-time Ultrasound Guided Injection of left knee Device: GE Logiq E  Ultrasound guided injection is preferred based studies that show increased duration, increased effect, greater accuracy, decreased procedural pain, increased response rate, and decreased cost with ultrasound guided versus blind injection.  Verbal informed consent obtained.  Time-out conducted.  Noted no overlying erythema, induration, or other signs of local infection.  Skin prepped in a sterile fashion.  Local anesthesia: Topical Ethyl chloride.  With sterile technique and under real time ultrasound guidance: With a 22-gauge 2 inch needle patient was injected with15 mg/2.5 mL of Orthovisc (sodium hyaluronate) in a prefilled syringe was injected easily into the knee through a 22-gauge needle. This was from a superior lateral approach.  Completed without difficulty  Pain immediately resolved suggesting accurate placement of the medication.  Advised to call if fevers/chills, erythema, induration, drainage, or persistent bleeding.  Images permanently stored and available for review in the ultrasound unit.  Impression: Technically successful ultrasound guided injection.    Impression and Recommendations:     This case required medical decision making of moderate complexity.

## 2014-04-05 NOTE — Patient Instructions (Signed)
You are doing great COntinue to hit the bike and the exercises Wear brace when you need it See me again in 1 month.

## 2014-04-05 NOTE — Progress Notes (Signed)
Pre visit review using our clinic review tool, if applicable. No additional management support is needed unless otherwise documented below in the visit note. 

## 2014-05-02 ENCOUNTER — Ambulatory Visit (INDEPENDENT_AMBULATORY_CARE_PROVIDER_SITE_OTHER): Payer: MEDICARE | Admitting: General Practice

## 2014-05-02 DIAGNOSIS — I4891 Unspecified atrial fibrillation: Secondary | ICD-10-CM

## 2014-05-02 DIAGNOSIS — Z5181 Encounter for therapeutic drug level monitoring: Secondary | ICD-10-CM | POA: Diagnosis not present

## 2014-05-02 DIAGNOSIS — G459 Transient cerebral ischemic attack, unspecified: Secondary | ICD-10-CM | POA: Diagnosis not present

## 2014-05-02 LAB — POCT INR: INR: 2.5

## 2014-05-02 NOTE — Progress Notes (Signed)
Agree with plan 

## 2014-05-02 NOTE — Progress Notes (Signed)
Pre visit review using our clinic review tool, if applicable. No additional management support is needed unless otherwise documented below in the visit note. 

## 2014-05-04 ENCOUNTER — Encounter: Payer: Self-pay | Admitting: Family Medicine

## 2014-05-04 ENCOUNTER — Ambulatory Visit (INDEPENDENT_AMBULATORY_CARE_PROVIDER_SITE_OTHER): Payer: MEDICARE | Admitting: Family Medicine

## 2014-05-04 VITALS — BP 118/62 | HR 80 | Ht 70.0 in | Wt 212.0 lb

## 2014-05-04 DIAGNOSIS — M179 Osteoarthritis of knee, unspecified: Secondary | ICD-10-CM

## 2014-05-04 DIAGNOSIS — IMO0002 Reserved for concepts with insufficient information to code with codable children: Secondary | ICD-10-CM

## 2014-05-04 DIAGNOSIS — M1710 Unilateral primary osteoarthritis, unspecified knee: Secondary | ICD-10-CM

## 2014-05-04 NOTE — Patient Instructions (Addendum)
Good to see you as always.  Ice is your friend Conitnue to monitor but I would see Allusion You tried hard and I am sorry we did not get better.

## 2014-05-04 NOTE — Progress Notes (Signed)
  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 Patient has known severe arthritis of this knee. Patient has undergone Orthovisc injection series one month ago. Patient was to continue bracing, home exercises, icing protocol. Patient states his knees are feeling better for 2 weeks and then unfortunately the pain is started to return. Patient is also having a feeling that this knee is unstable and giving out on him. Patient does have a past medical history significant for right total knee replacement.    Past medical history, social, surgical and family history all reviewed in electronic medical record.   Review of Systems: No headache, visual changes, nausea, vomiting, diarrhea, constipation, dizziness, abdominal pain, skin rash, fevers, chills, night sweats, weight loss, swollen lymph nodes, body aches, joint swelling, muscle aches, chest pain, shortness of breath, mood changes.   Objective Blood pressure 118/62, pulse 80, height 5\' 10"  (1.778 m), weight 212 lb (96.163 kg).  General: No apparent distress alert and oriented x3 mood and affect normal, dressed appropriately.  HEENT: Pupils equal, extraocular movements intact  Respiratory: Patient's speak in full sentences and does not appear short of breath  Cardiovascular: No lower extremity edema, non tender, no erythema  Skin: Warm dry intact with no signs of infection or rash on extremities or on axial skeleton.  Abdomen: Soft nontender  Neuro: Cranial nerves II through XII are intact, neurovascularly intact in all extremities with 2+ DTRs and 2+ pulses.  Lymph: No lymphadenopathy of posterior or anterior cervical chain or axillae bilaterally.  Gait antalgic gait noted still MSK:  Non tender with full range of motion and good stability and  symmetric strength and tone of shoulders, elbows, wrist, hip,  and ankles bilaterally. Moderate osteophytic changes of multiple joints.  Knee: Left Inspection reveals no effusion  Tender to palpation over the medial joint line Patient does have full range of motion but does have tenderness at the extreme positions. Negative Mcmurray's, Apley's, and Thessalonian tests. Patellar glide moderate crepitus. Patellar and quadriceps tendons unremarkable. Hamstring and quadriceps strength is normal.  Contralateral knee has total arthroplasty with well-healed incision. Nontender on exam. No significant change from previous exam    Impression and Recommendations:     This case required medical decision making of moderate complexity.

## 2014-05-04 NOTE — Assessment & Plan Note (Signed)
Discussed with patient again at great length. I do believe that unfortunately patient does have severe arthritis that has now failed all conservative therapy. Patient has undergone a right total knee replacement multiple years ago. We discussed that this pain is affecting all his daily activities and is seeming to be unstable. Patient is going to follow-up with his orthopedic surgeon for further evaluation and the possibility of a replacement. Patient has other problems including gallstones as well as a renal cyst that needs to be taking care of as well. Patient will have to prioritize. Discussed with him that in one month we can try another steroid injection if necessary. This would only be a temporizing measure. Otherwise patient can follow-up on an as-needed basis.  Spent  25 minutes with patient face-to-face and had greater than 50% of counseling including as described above in assessment and plan.

## 2014-05-04 NOTE — Progress Notes (Signed)
Pre visit review using our clinic review tool, if applicable. No additional management support is needed unless otherwise documented below in the visit note. 

## 2014-05-14 ENCOUNTER — Other Ambulatory Visit: Payer: Self-pay | Admitting: *Deleted

## 2014-05-14 MED ORDER — WARFARIN SODIUM 5 MG PO TABS: ORAL_TABLET | ORAL | Status: DC

## 2014-06-12 ENCOUNTER — Ambulatory Visit (INDEPENDENT_AMBULATORY_CARE_PROVIDER_SITE_OTHER): Payer: MEDICARE | Admitting: General Practice

## 2014-06-12 DIAGNOSIS — I4891 Unspecified atrial fibrillation: Secondary | ICD-10-CM | POA: Diagnosis not present

## 2014-06-12 DIAGNOSIS — G459 Transient cerebral ischemic attack, unspecified: Secondary | ICD-10-CM | POA: Diagnosis not present

## 2014-06-12 DIAGNOSIS — Z5181 Encounter for therapeutic drug level monitoring: Secondary | ICD-10-CM

## 2014-06-12 LAB — POCT INR: INR: 3.2

## 2014-06-12 NOTE — Progress Notes (Signed)
Agree with plan 

## 2014-06-12 NOTE — Progress Notes (Signed)
Pre visit review using our clinic review tool, if applicable. No additional management support is needed unless otherwise documented below in the visit note. 

## 2014-06-13 ENCOUNTER — Ambulatory Visit: Payer: MEDICARE

## 2014-07-10 ENCOUNTER — Ambulatory Visit (INDEPENDENT_AMBULATORY_CARE_PROVIDER_SITE_OTHER): Payer: MEDICARE | Admitting: Internal Medicine

## 2014-07-10 ENCOUNTER — Encounter: Payer: Self-pay | Admitting: Internal Medicine

## 2014-07-10 ENCOUNTER — Ambulatory Visit (INDEPENDENT_AMBULATORY_CARE_PROVIDER_SITE_OTHER): Payer: MEDICARE | Admitting: General Practice

## 2014-07-10 VITALS — BP 130/80 | HR 69 | Wt 211.0 lb

## 2014-07-10 DIAGNOSIS — I481 Persistent atrial fibrillation: Secondary | ICD-10-CM | POA: Diagnosis not present

## 2014-07-10 DIAGNOSIS — I4891 Unspecified atrial fibrillation: Secondary | ICD-10-CM

## 2014-07-10 DIAGNOSIS — I4819 Other persistent atrial fibrillation: Secondary | ICD-10-CM

## 2014-07-10 DIAGNOSIS — Q6100 Congenital renal cyst, unspecified: Secondary | ICD-10-CM

## 2014-07-10 DIAGNOSIS — I10 Essential (primary) hypertension: Secondary | ICD-10-CM

## 2014-07-10 DIAGNOSIS — I2510 Atherosclerotic heart disease of native coronary artery without angina pectoris: Secondary | ICD-10-CM

## 2014-07-10 DIAGNOSIS — G459 Transient cerebral ischemic attack, unspecified: Secondary | ICD-10-CM

## 2014-07-10 DIAGNOSIS — N320 Bladder-neck obstruction: Secondary | ICD-10-CM

## 2014-07-10 DIAGNOSIS — R109 Unspecified abdominal pain: Secondary | ICD-10-CM

## 2014-07-10 DIAGNOSIS — N281 Cyst of kidney, acquired: Secondary | ICD-10-CM

## 2014-07-10 DIAGNOSIS — Z5181 Encounter for therapeutic drug level monitoring: Secondary | ICD-10-CM

## 2014-07-10 DIAGNOSIS — G8929 Other chronic pain: Secondary | ICD-10-CM

## 2014-07-10 DIAGNOSIS — R1011 Right upper quadrant pain: Secondary | ICD-10-CM

## 2014-07-10 LAB — POCT INR: INR: 2.6

## 2014-07-10 NOTE — Assessment & Plan Note (Signed)
F/u Dr Jimmye Norman US IMPRESSION:  Simple cysts in right kidney, largest in the upper pole measuring  7.4 x 7.7 x 7.8 cm. Prostatomegaly. Incidental finding of  gallstones.  Electronically Signed  By: Abelardo Diesel M.D.  On: 03/23/2013 15:30

## 2014-07-10 NOTE — Assessment & Plan Note (Signed)
  Medication Alpha blockade - Terazosin

## 2014-07-10 NOTE — Progress Notes (Signed)
Subjective:     HPI  F/u R sided discomfort, R renal cyst - seeing Dr Alinda Money  The patient presents for a follow-up of  A fib, chronic hypertension, chronic dyslipidemia,TIA controlled with medicines. F/u R knee pain  He has a large cyst on R kidney (CT, [place]) Dr Jasmine December - pt wants to switch to Dr Alinda Money   BP Readings from Last 3 Encounters:  07/10/14 130/80  05/04/14 118/62  04/05/14 100/62   Wt Readings from Last 3 Encounters:  07/10/14 211 lb (95.709 kg)  05/04/14 212 lb (96.163 kg)  04/05/14 212 lb (96.163 kg)      Review of Systems  Constitutional: Negative for appetite change, fatigue and unexpected weight change.  HENT: Negative for congestion, nosebleeds, sneezing, sore throat and trouble swallowing.   Eyes: Negative for itching and visual disturbance.  Respiratory: Negative for cough.   Cardiovascular: Negative for chest pain, palpitations and leg swelling.  Gastrointestinal: Positive for abdominal pain. Negative for nausea, diarrhea, blood in stool and abdominal distention.  Genitourinary: Positive for frequency. Negative for hematuria.  Musculoskeletal: Negative for back pain, joint swelling, gait problem and neck pain.  Skin: Negative for rash.  Neurological: Negative for dizziness, tremors, speech difficulty and weakness.  Psychiatric/Behavioral: Negative for suicidal ideas, confusion, sleep disturbance, dysphoric mood and agitation. The patient is not nervous/anxious.        Objective:   Physical Exam  Constitutional: He is oriented to person, place, and time. He appears well-developed. No distress.  NAD  HENT:  Mouth/Throat: Oropharynx is clear and moist.  Eyes: Conjunctivae are normal. Pupils are equal, round, and reactive to light.  Neck: Normal range of motion. No JVD present. No thyromegaly present.  Cardiovascular: Normal rate, regular rhythm, normal heart sounds and intact distal pulses.  Exam reveals no gallop and no friction rub.   No murmur  heard. Pulmonary/Chest: Effort normal and breath sounds normal. No respiratory distress. He has no wheezes. He has no rales. He exhibits no tenderness.  Abdominal: Soft. Bowel sounds are normal. He exhibits no distension and no mass. There is no tenderness. There is no rebound and no guarding.  Musculoskeletal: Normal range of motion. He exhibits no edema or tenderness.  Lymphadenopathy:    He has no cervical adenopathy.  Neurological: He is alert and oriented to person, place, and time. He has normal reflexes. No cranial nerve deficit. He exhibits normal muscle tone. He displays a negative Romberg sign. Coordination and gait normal.  No meningeal signs  Skin: Skin is warm and dry. No rash noted.  Psychiatric: He has a normal mood and affect. His behavior is normal. Judgment and thought content normal.  abd NT R/RUQ flank tapping is sensitive  Lab Results  Component Value Date   WBC 6.9 10/03/2013   HGB 13.7 10/03/2013   HCT 42.6 10/03/2013   PLT 130* 10/03/2013   GLUCOSE 100* 03/08/2014   CHOL 132 03/08/2014   TRIG 66.0 03/08/2014   HDL 44.30 03/08/2014   LDLCALC 75 03/08/2014   ALT 17 03/08/2014   AST 21 03/08/2014   NA 138 03/08/2014   K 4.3 03/08/2014   CL 105 03/08/2014   CREATININE 1.08 03/08/2014   BUN 17 03/08/2014   CO2 28 03/08/2014   TSH 2.67 04/22/2009   PSA 2.06 04/22/2009   INR 3.2 06/12/2014   HGBA1C * 10/23/2009    6.0 (NOTE)  According to the ADA Clinical Practice Recommendations for 2011, when HbA1c is used as a screening test:   >=6.5%   Diagnostic of Diabetes Mellitus           (if abnormal result  is confirmed)  5.7-6.4%   Increased risk of developing Diabetes Mellitus  References:Diagnosis and Classification of Diabetes Mellitus,Diabetes LDKC,4619,01(QQUIV 1):S62-S69 and Standards of Medical Care in         Diabetes - 2011,Diabetes HOYW,3142,76  (Suppl 1):S11-S61.    Abd US IMPRESSION:   Simple cysts in right kidney, largest in the upper pole measuring  7.4 x 7.7 x 7.8 cm. Prostatomegaly. Incidental finding of  gallstones.  Electronically Signed  By: Abelardo Diesel M.D.  On: 03/23/2013 15:30  Cologuard neg (2015) - pt refused colonoscopy     Assessment & Plan:

## 2014-07-10 NOTE — Progress Notes (Signed)
Pre visit review using our clinic review tool, if applicable. No additional management support is needed unless otherwise documented below in the visit note. 

## 2014-07-10 NOTE — Assessment & Plan Note (Signed)
On Coumadin 

## 2014-07-10 NOTE — Assessment & Plan Note (Signed)
No change F/u w/Dr Alinda Money

## 2014-07-10 NOTE — Assessment & Plan Note (Signed)
Not on ASA (on Coumadin), °NTG prn °On Simvastatin °

## 2014-07-10 NOTE — Progress Notes (Signed)
Agree with plan 

## 2014-07-11 ENCOUNTER — Ambulatory Visit: Payer: MEDICARE

## 2014-07-24 ENCOUNTER — Encounter: Payer: Self-pay | Admitting: Internal Medicine

## 2014-07-24 ENCOUNTER — Ambulatory Visit (INDEPENDENT_AMBULATORY_CARE_PROVIDER_SITE_OTHER): Payer: MEDICARE | Admitting: Internal Medicine

## 2014-07-24 VITALS — BP 140/76 | HR 73 | Temp 98.8°F | Wt 209.0 lb

## 2014-07-24 DIAGNOSIS — J209 Acute bronchitis, unspecified: Secondary | ICD-10-CM | POA: Insufficient documentation

## 2014-07-24 DIAGNOSIS — I2510 Atherosclerotic heart disease of native coronary artery without angina pectoris: Secondary | ICD-10-CM | POA: Diagnosis not present

## 2014-07-24 MED ORDER — AMOXICILLIN 875 MG PO TABS: 875.0000 mg | ORAL_TABLET | Freq: Two times a day (BID) | ORAL | Status: DC

## 2014-07-24 MED ORDER — PROMETHAZINE-CODEINE 6.25-10 MG/5ML PO SYRP: 5.0000 mL | ORAL_SOLUTION | ORAL | Status: DC | PRN

## 2014-07-24 NOTE — Progress Notes (Signed)
Subjective:     Cough This is a new problem. The current episode started in the past 7 days. The problem occurs every few minutes. The cough is productive of purulent sputum. Associated symptoms include nasal congestion. Pertinent negatives include no chest pain, ear pain, headaches, rash, sore throat or weight loss. The treatment provided no relief. There is no history of COPD.    F/u R sided discomfort, R renal cyst - seeing Dr Alinda Money  The patient presents for a follow-up of  A fib, chronic hypertension, chronic dyslipidemia,TIA controlled with medicines. F/u R knee pain  He has a large cyst on R kidney (CT, [place]) Dr Jasmine December - pt wants to switch to Dr Alinda Money   BP Readings from Last 3 Encounters:  07/24/14 140/76  07/10/14 130/80  05/04/14 118/62   Wt Readings from Last 3 Encounters:  07/24/14 209 lb (94.802 kg)  07/10/14 211 lb (95.709 kg)  05/04/14 212 lb (96.163 kg)      Review of Systems  Constitutional: Negative for weight loss, appetite change, fatigue and unexpected weight change.  HENT: Negative for congestion, ear pain, nosebleeds, sneezing, sore throat and trouble swallowing.   Eyes: Negative for itching and visual disturbance.  Respiratory: Positive for cough.   Cardiovascular: Negative for chest pain, palpitations and leg swelling.  Gastrointestinal: Positive for abdominal pain. Negative for nausea, diarrhea, blood in stool and abdominal distention.  Genitourinary: Positive for frequency. Negative for hematuria.  Musculoskeletal: Negative for back pain, joint swelling, gait problem and neck pain.  Skin: Negative for rash.  Neurological: Negative for dizziness, tremors, speech difficulty, weakness and headaches.  Psychiatric/Behavioral: Negative for suicidal ideas, confusion, sleep disturbance, dysphoric mood and agitation. The patient is not nervous/anxious.        Objective:   Physical Exam  Constitutional: He is oriented to person, place, and time. He  appears well-developed. No distress.  NAD  HENT:  Mouth/Throat: Oropharynx is clear and moist.  Eyes: Conjunctivae are normal. Pupils are equal, round, and reactive to light.  Neck: Normal range of motion. No JVD present. No thyromegaly present.  Cardiovascular: Normal rate, regular rhythm, normal heart sounds and intact distal pulses.  Exam reveals no gallop and no friction rub.   No murmur heard. Pulmonary/Chest: Effort normal and breath sounds normal. No respiratory distress. He has no wheezes. He has no rales. He exhibits no tenderness.  Abdominal: Soft. Bowel sounds are normal. He exhibits no distension and no mass. There is no tenderness. There is no rebound and no guarding.  Musculoskeletal: Normal range of motion. He exhibits no edema or tenderness.  Lymphadenopathy:    He has no cervical adenopathy.  Neurological: He is alert and oriented to person, place, and time. He has normal reflexes. No cranial nerve deficit. He exhibits normal muscle tone. He displays a negative Romberg sign. Coordination and gait normal.  Skin: Skin is warm and dry. No rash noted.  Psychiatric: He has a normal mood and affect. His behavior is normal. Judgment and thought content normal.  abd NT R/RUQ flank tapping is sensitive  Lab Results  Component Value Date   WBC 6.9 10/03/2013   HGB 13.7 10/03/2013   HCT 42.6 10/03/2013   PLT 130* 10/03/2013   GLUCOSE 100* 03/08/2014   CHOL 132 03/08/2014   TRIG 66.0 03/08/2014   HDL 44.30 03/08/2014   LDLCALC 75 03/08/2014   ALT 17 03/08/2014   AST 21 03/08/2014   NA 138 03/08/2014   K 4.3 03/08/2014  CL 105 03/08/2014   CREATININE 1.08 03/08/2014   BUN 17 03/08/2014   CO2 28 03/08/2014   TSH 2.67 04/22/2009   PSA 2.06 04/22/2009   INR 2.6 07/10/2014   HGBA1C * 10/23/2009    6.0 (NOTE)                                                                       According to the ADA Clinical Practice Recommendations for 2011, when HbA1c is used as a  screening test:   >=6.5%   Diagnostic of Diabetes Mellitus           (if abnormal result  is confirmed)  5.7-6.4%   Increased risk of developing Diabetes Mellitus  References:Diagnosis and Classification of Diabetes Mellitus,Diabetes ITVI,7125,27(HSJWT 1):S62-S69 and Standards of Medical Care in         Diabetes - 2011,Diabetes Care,2011,34  (Suppl 1):S11-S61.    Abd US IMPRESSION:  Simple cysts in right kidney, largest in the upper pole measuring  7.4 x 7.7 x 7.8 cm. Prostatomegaly. Incidental finding of  gallstones.  Electronically Signed  By: Abelardo Diesel M.D.  On: 03/23/2013 15:30  Cologuard neg (2015) - pt refused colonoscopy     Assessment & Plan:

## 2014-07-24 NOTE — Progress Notes (Signed)
Pre visit review using our clinic review tool, if applicable. No additional management support is needed unless otherwise documented below in the visit note. 

## 2014-07-24 NOTE — Assessment & Plan Note (Signed)
amox 875 bid

## 2014-08-08 ENCOUNTER — Ambulatory Visit (INDEPENDENT_AMBULATORY_CARE_PROVIDER_SITE_OTHER): Payer: MEDICARE | Admitting: *Deleted

## 2014-08-08 DIAGNOSIS — I480 Paroxysmal atrial fibrillation: Secondary | ICD-10-CM | POA: Diagnosis not present

## 2014-08-08 DIAGNOSIS — Z5181 Encounter for therapeutic drug level monitoring: Secondary | ICD-10-CM | POA: Diagnosis not present

## 2014-08-08 DIAGNOSIS — I4891 Unspecified atrial fibrillation: Secondary | ICD-10-CM | POA: Diagnosis not present

## 2014-08-08 DIAGNOSIS — G459 Transient cerebral ischemic attack, unspecified: Secondary | ICD-10-CM

## 2014-08-08 LAB — POCT INR: INR: 3.5

## 2014-08-08 NOTE — Progress Notes (Signed)
I have reviewed and agree with the plan. 

## 2014-08-08 NOTE — Progress Notes (Signed)
Pre visit review using our clinic review tool, if applicable. No additional management support is needed unless otherwise documented below in the visit note. 

## 2014-08-15 ENCOUNTER — Telehealth: Payer: Self-pay | Admitting: Internal Medicine

## 2014-08-15 NOTE — Telephone Encounter (Signed)
returned call and lvm for pt to call radiology to r/s ct then call us back to r/s all other appts around the ct

## 2014-08-17 ENCOUNTER — Telehealth: Payer: Self-pay | Admitting: Internal Medicine

## 2014-08-17 NOTE — Telephone Encounter (Signed)
returned pt's call and r/s lab adn MD to ct scan dates....pto k and aware....pt is al concerned for insurance and ct scan transferred to [facility].

## 2014-08-31 ENCOUNTER — Ambulatory Visit: Payer: Self-pay | Admitting: Orthopedic Surgery

## 2014-08-31 NOTE — Progress Notes (Signed)
Preoperative surgical orders have been place into the Epic hospital system for MELVIN WHITEFORD on 08/31/2014, 12:54 PM  by Mickel Crow for surgery on 09/24/2014.  Preop Total Knee orders including Experal, IV Tylenol, and IV Decadron as long as there are no contraindications to the above medications. Arlee Muslim, PA-C

## 2014-09-05 ENCOUNTER — Ambulatory Visit (INDEPENDENT_AMBULATORY_CARE_PROVIDER_SITE_OTHER): Payer: MEDICARE | Admitting: General Practice

## 2014-09-05 DIAGNOSIS — I4891 Unspecified atrial fibrillation: Secondary | ICD-10-CM

## 2014-09-05 DIAGNOSIS — Z5181 Encounter for therapeutic drug level monitoring: Secondary | ICD-10-CM | POA: Diagnosis not present

## 2014-09-05 DIAGNOSIS — G459 Transient cerebral ischemic attack, unspecified: Secondary | ICD-10-CM

## 2014-09-05 LAB — POCT INR: INR: 2.9

## 2014-09-05 NOTE — Progress Notes (Signed)
I have reviewed and agree with the plan. 

## 2014-09-05 NOTE — Progress Notes (Signed)
Pre visit review using our clinic review tool, if applicable. No additional management support is needed unless otherwise documented below in the visit note. 

## 2014-09-06 ENCOUNTER — Other Ambulatory Visit: Payer: Self-pay | Admitting: Medical Oncology

## 2014-09-06 DIAGNOSIS — C3491 Malignant neoplasm of unspecified part of right bronchus or lung: Secondary | ICD-10-CM

## 2014-09-07 ENCOUNTER — Encounter (HOSPITAL_COMMUNITY): Payer: Self-pay

## 2014-09-07 ENCOUNTER — Other Ambulatory Visit (HOSPITAL_BASED_OUTPATIENT_CLINIC_OR_DEPARTMENT_OTHER): Payer: MEDICARE

## 2014-09-07 ENCOUNTER — Ambulatory Visit (HOSPITAL_COMMUNITY)
Admission: RE | Admit: 2014-09-07 | Discharge: 2014-09-07 | Disposition: A | Discharge status: Home / Self Care | Payer: MEDICARE | Source: Ambulatory Visit | Attending: Internal Medicine | Admitting: Internal Medicine

## 2014-09-07 DIAGNOSIS — C3491 Malignant neoplasm of unspecified part of right bronchus or lung: Secondary | ICD-10-CM

## 2014-09-07 DIAGNOSIS — Z85118 Personal history of other malignant neoplasm of bronchus and lung: Secondary | ICD-10-CM | POA: Diagnosis present

## 2014-09-07 DIAGNOSIS — C342 Malignant neoplasm of middle lobe, bronchus or lung: Secondary | ICD-10-CM

## 2014-09-07 DIAGNOSIS — C3490 Malignant neoplasm of unspecified part of unspecified bronchus or lung: Secondary | ICD-10-CM | POA: Diagnosis not present

## 2014-09-07 LAB — CBC WITH DIFFERENTIAL/PLATELET
BASO%: 0.5 % (ref 0.0–2.0)
Basophils Absolute: 0 10*3/uL (ref 0.0–0.1)
EOS%: 0.7 % (ref 0.0–7.0)
Eosinophils Absolute: 0 10*3/uL (ref 0.0–0.5)
HCT: 42.2 % (ref 38.4–49.9)
HEMOGLOBIN: 13.9 g/dL (ref 13.0–17.1)
LYMPH%: 35.5 % (ref 14.0–49.0)
MCH: 30.1 pg (ref 27.2–33.4)
MCHC: 32.9 g/dL (ref 32.0–36.0)
MCV: 91.5 fL (ref 79.3–98.0)
MONO#: 0.5 10*3/uL (ref 0.1–0.9)
MONO%: 7.4 % (ref 0.0–14.0)
NEUT#: 3.6 10*3/uL (ref 1.5–6.5)
NEUT%: 55.9 % (ref 39.0–75.0)
Platelets: 136 10*3/uL — ABNORMAL LOW (ref 140–400)
RBC: 4.62 10*6/uL (ref 4.20–5.82)
RDW: 14.8 % — ABNORMAL HIGH (ref 11.0–14.6)
WBC: 6.5 10*3/uL (ref 4.0–10.3)
lymph#: 2.3 10*3/uL (ref 0.9–3.3)

## 2014-09-07 LAB — COMPREHENSIVE METABOLIC PANEL (CC13)
ALT: 15 U/L (ref 0–55)
AST: 20 U/L (ref 5–34)
Albumin: 3.6 g/dL (ref 3.5–5.0)
Alkaline Phosphatase: 43 U/L (ref 40–150)
Anion Gap: 6 mEq/L (ref 3–11)
BILIRUBIN TOTAL: 0.67 mg/dL (ref 0.20–1.20)
BUN: 16.3 mg/dL (ref 7.0–26.0)
CO2: 26 meq/L (ref 22–29)
Calcium: 9 mg/dL (ref 8.4–10.4)
Chloride: 109 mEq/L (ref 98–109)
Creatinine: 1 mg/dL (ref 0.7–1.3)
EGFR: 68 mL/min/{1.73_m2} — ABNORMAL LOW (ref >90)
Glucose: 92 mg/dl (ref 70–140)
Potassium: 4.5 mEq/L (ref 3.5–5.1)
SODIUM: 141 meq/L (ref 136–145)
TOTAL PROTEIN: 6.5 g/dL (ref 6.4–8.3)

## 2014-09-07 MED ORDER — IOHEXOL 300 MG/ML  SOLN
100.0000 mL | Freq: Once | INTRAMUSCULAR | Status: AC | PRN
  Administered 2014-09-07: 80 mL via INTRAVENOUS

## 2014-09-12 ENCOUNTER — Ambulatory Visit (HOSPITAL_BASED_OUTPATIENT_CLINIC_OR_DEPARTMENT_OTHER): Payer: MEDICARE | Admitting: Internal Medicine

## 2014-09-12 ENCOUNTER — Telehealth: Payer: Self-pay | Admitting: Internal Medicine

## 2014-09-12 ENCOUNTER — Encounter: Payer: Self-pay | Admitting: Internal Medicine

## 2014-09-12 VITALS — BP 109/63 | HR 61 | Temp 98.0°F | Resp 17 | Ht 70.0 in | Wt 212.5 lb

## 2014-09-12 DIAGNOSIS — C3491 Malignant neoplasm of unspecified part of right bronchus or lung: Secondary | ICD-10-CM

## 2014-09-12 DIAGNOSIS — Z85118 Personal history of other malignant neoplasm of bronchus and lung: Secondary | ICD-10-CM

## 2014-09-12 NOTE — Telephone Encounter (Signed)
Gave patient avs report and appointments for July 2017. Radiology schedulers will call with ct - pt aware.

## 2014-09-12 NOTE — Progress Notes (Signed)
[place] [phone]   Fax:([phone]  OFFICE PROGRESS NOTE  Bernard Kehr, MD Bernard [name] [id number]  PRINCIPAL DIAGNOSIS: Stage IA non-small-cell lung cancer (T1a N0 M0) adenocarcinoma diagnosed in June 2010.   PRIOR THERAPY: Status post right middle lobectomy under the care of Dr. Arlyce Dice on August 28, 2008.   CURRENT THERAPY: Observation.  INTERVAL HISTORY: Bernard [name] 81 y.o. male returns to the clinic today for annual follow up visit accompanied by his wife. He has been observation for the last 6 years with no significant evidence for disease recurrence. The patient is feeling fine today with no specific complaints. He denied having any significant chest pain, shortness of breath, cough or hemoptysis. Marland KitchenHe has no weight loss or night sweats. He is scheduled to have left knee replacement on 09/24/2014. The patient has repeat CT scan of the chest performed recently and he is here for evaluation and discussion of his scan results.  MEDICAL HISTORY: Past Medical History  Diagnosis Date  . History of BPH     benign prostatic hypertrophy  . Paroxysmal atrial fibrillation     postoperative after lung surgery  . Osteoarthrosis, unspecified whether generalized or localized, lower leg     knee  . Labyrinthitis, unspecified   . Esophageal reflux   . Cerebrovascular disease     s/p prior stroke  . Unspecified essential hypertension   . Other and unspecified hyperlipidemia   . Depressive disorder, not elsewhere classified   . CAD (coronary artery disease)     s/p MI 26s, normal myoview 7/11  . Cancer     lung ca dx'd 2010  . Malignant neoplasm middle lobe, bronchus or lung     right lung. Dx 07/27/08, sp/p resection 7/10.     ALLERGIES:  has No Known Allergies.  MEDICATIONS:  Current Outpatient Prescriptions  Medication Sig Dispense Refill  . amoxicillin (AMOXIL) 875 MG tablet Take 1 tablet (875 mg total) by mouth 2 (two)  times daily. 20 tablet 0  . APAP-Isometheptene-Dichloral 325-65-100 MG per capsule Take 1 capsule by mouth 4 (four) times daily as needed. 30 capsule 3  . Calcium Carbonate (CALTRATE 600) 1500 MG TABS Take 1 tablet by mouth daily.      . cholecalciferol (VITAMIN D) 1000 UNITS tablet Take 1,000 Units by mouth daily.      . diazepam (VALIUM) 5 MG tablet Take 1 tablet (5 mg total) by mouth 2 (two) times daily. 180 tablet 1  . finasteride (PROSCAR) 5 MG tablet Take 1 tablet (5 mg total) by mouth daily. 90 tablet 3  . multivitamin-lutein (OCUVITE-LUTEIN) CAPS Take 1 capsule by mouth daily.    . nitroGLYCERIN (NITROSTAT) 0.4 MG SL tablet Place 1 tablet (0.4 mg total) under the tongue every 5 (five) minutes as needed. 25 tablet 3  . omeprazole (PRILOSEC) 20 MG capsule Take 1 capsule (20 mg total) by mouth daily. Every other day  90 capsule 1  . promethazine-codeine (PHENERGAN WITH CODEINE) 6.25-10 MG/5ML syrup Take 5 mLs by mouth every 4 (four) hours as needed. 300 mL 0  . simvastatin (ZOCOR) 20 MG tablet Take 1 tablet (20 mg total) by mouth daily. 90 tablet 3  . terazosin (HYTRIN) 5 MG capsule Take 1 capsule (5 mg total) by mouth once. 90 capsule 3  . warfarin (COUMADIN) 5 MG tablet Take as directed by anticoagulation clinic 90 tablet 3   No current facility-administered medications for this  visit.    SURGICAL HISTORY:  Past Surgical History  Procedure Laterality Date  . Esophagogastroduodenoscopy  05/14/04  . Bunionectomy  '89    left foot  . Inguinal hernia repair  '71    left  . Anthroscopic surgery  '10    right knee \  . Vat-rml lobectomy    . Right tkr  8/11  . Eye surgery      both eyes - 2011 [facility])    REVIEW OF SYSTEMS:  A comprehensive review of systems was negative.   PHYSICAL EXAMINATION: General appearance: alert, cooperative and no distress Head: Normocephalic, without obvious abnormality, atraumatic Neck: no adenopathy Lymph nodes: Cervical, supraclavicular, and  axillary nodes normal. Resp: clear to auscultation bilaterally Cardio: regular rate and rhythm, S1, S2 normal, no murmur, click, rub or gallop GI: soft, non-tender; bowel sounds normal; no masses,  no organomegaly Extremities: extremities normal, atraumatic, no cyanosis or edema  ECOG PERFORMANCE STATUS: 1 - Symptomatic but completely ambulatory  Blood pressure 109/63, pulse 61, temperature 98 F (36.7 C), temperature source Oral, resp. rate 17, height '5\' 10"'$  (1.778 m), weight 212 lb 8 oz (96.389 kg), SpO2 97 %.  LABORATORY DATA: Lab Results  Component Value Date   WBC 6.5 09/07/2014   HGB 13.9 09/07/2014   HCT 42.2 09/07/2014   MCV 91.5 09/07/2014   PLT 136* 09/07/2014      Chemistry      Component Value Date/Time   NA 141 09/07/2014 1030   NA 138 03/08/2014 1124   NA 141 09/29/2011 1218   K 4.5 09/07/2014 1030   K 4.3 03/08/2014 1124   K 4.4 09/29/2011 1218   CL 105 03/08/2014 1124   CL 103 09/29/2011 1218   CO2 26 09/07/2014 1030   CO2 28 03/08/2014 1124   CO2 28 09/29/2011 1218   BUN 16.3 09/07/2014 1030   BUN 17 03/08/2014 1124   BUN 16 09/29/2011 1218   CREATININE 1.0 09/07/2014 1030   CREATININE 1.08 03/08/2014 1124   CREATININE 1.3* 09/29/2011 1218      Component Value Date/Time   CALCIUM 9.0 09/07/2014 1030   CALCIUM 8.9 03/08/2014 1124   CALCIUM 8.8 09/29/2011 1218   ALKPHOS 43 09/07/2014 1030   ALKPHOS 39 03/08/2014 1124   ALKPHOS 38 09/29/2011 1218   AST 20 09/07/2014 1030   AST 21 03/08/2014 1124   AST 29 09/29/2011 1218   ALT 15 09/07/2014 1030   ALT 17 03/08/2014 1124   ALT 27 09/29/2011 1218   BILITOT 0.67 09/07/2014 1030   BILITOT 0.6 03/08/2014 1124   BILITOT 1.00 09/29/2011 1218       RADIOGRAPHIC STUDIES: Ct Chest W Contrast  09/07/2014   CLINICAL DATA:  Lung cancer diagnosed June 2010 with surgery only. Right lung surgery.  EXAM: CT CHEST WITH CONTRAST  TECHNIQUE: Multidetector CT imaging of the chest was performed during  intravenous contrast administration.  CONTRAST:  60m OMNIPAQUE IOHEXOL 300 MG/ML  SOLN  COMPARISON:  CT 10/03/2013  FINDINGS: Mediastinum/Nodes: No axillary supraclavicular lymphadenopathy. No mediastinal hilar lymphadenopathy. No pericardial fluid. Esophagus is normal.  Lungs/Pleura:  There is linear scarring at the right lung base at the resection margin. No nodularity. Small calcified nodule in the right upper lobe is unchanged on image 17. Left lung is clear.  Upper abdomen: Limited view of the liver, kidneys, pancreas are unremarkable. Normal adrenal glands. Benign cyst of the right kidney.  Musculoskeletal: No aggressive osseous lesion.  IMPRESSION: 1. No evidence lung  cancer recurrence. 2. Postsurgical changes right lung.   Electronically Signed   By: Suzy Bouchard M.D.   On: 09/07/2014 14:17   ASSESSMENT AND PLAN: this is a very pleasant 81 years old white male with a stage IA non-small cell lung cancer status post resection and has been observation since 2010 with no evidence for disease recurrence.  I discussed the scan results with the patient and his wife.  I recommended for him to continue on observation with repeat CT scan of the chest without contrast in one year. He was advised to call immediately if he has any concerning symptoms in the interval.  The patient voices understanding of current disease status and treatment options and is in agreement with the current care plan.  All questions were answered. The patient knows to call the clinic with any problems, questions or concerns. We can certainly see the patient much sooner if necessary.  Disclaimer: This note was dictated with voice recognition software. Similar sounding words can inadvertently be transcribed and may be missed upon review.

## 2014-09-14 ENCOUNTER — Encounter (HOSPITAL_COMMUNITY): Payer: Self-pay

## 2014-09-14 ENCOUNTER — Encounter (HOSPITAL_COMMUNITY)
Admission: RE | Admit: 2014-09-14 | Discharge: 2014-09-14 | Disposition: A | Discharge status: Home / Self Care | Payer: MEDICARE | Source: Ambulatory Visit | Attending: Orthopedic Surgery | Admitting: Orthopedic Surgery

## 2014-09-14 DIAGNOSIS — Z01812 Encounter for preprocedural laboratory examination: Secondary | ICD-10-CM | POA: Diagnosis present

## 2014-09-14 DIAGNOSIS — M179 Osteoarthritis of knee, unspecified: Secondary | ICD-10-CM | POA: Insufficient documentation

## 2014-09-14 HISTORY — DX: Headache, unspecified: R51.9

## 2014-09-14 HISTORY — DX: Headache: R51

## 2014-09-14 HISTORY — DX: Cyst of kidney, acquired: N28.1

## 2014-09-14 LAB — PROTIME-INR
INR: 2.68 — AB (ref 0.00–1.49)
Prothrombin Time: 28.1 seconds — ABNORMAL HIGH (ref 11.6–15.2)

## 2014-09-14 LAB — URINALYSIS, ROUTINE W REFLEX MICROSCOPIC
BILIRUBIN URINE: NEGATIVE
Glucose, UA: NEGATIVE mg/dL
Hgb urine dipstick: NEGATIVE
KETONES UR: NEGATIVE mg/dL
Leukocytes, UA: NEGATIVE
Nitrite: NEGATIVE
PH: 7.5 (ref 5.0–8.0)
Protein, ur: NEGATIVE mg/dL
Specific Gravity, Urine: 1.026 (ref 1.005–1.030)
UROBILINOGEN UA: 1 mg/dL (ref 0.0–1.0)

## 2014-09-14 LAB — APTT: aPTT: 40 seconds — ABNORMAL HIGH (ref 24–37)

## 2014-09-14 LAB — SURGICAL PCR SCREEN
MRSA, PCR: NEGATIVE
Staphylococcus aureus: NEGATIVE

## 2014-09-14 NOTE — Progress Notes (Signed)
09-14-14 1620 Labs viewable in [place]. Note PT/PTT(Coumadin/Lovenox)-will have repeat AM preop PT/INR.

## 2014-09-14 NOTE — Patient Instructions (Addendum)
20 Bernard [name]  09/14/2014   Your procedure is scheduled on:   09-24-2014 Monday  Enter through [facility]  Entrance and follow signs to [facility]. Arrive at      0730  AM.  (Limit 1 person with you).  Call this number if you have problems the morning of surgery: [phone]  Or Presurgical Testing [phone].   For Living Will and/or Health Care Power Attorney Forms: please provide copy for your medical record,may bring AM of surgery(Forms should be already notarized -we do not provide this service).(09-14-14 Yes, prefers not to bring at this time-difficult to get .)   Do not eat food/ or drink: After Midnight.      Take these medicines the morning of surgery with A SIP OF WATER: NONE. Coumadin and Lovenox as per MD instructions.   Do not wear jewelry, make-up or nail polish.  Do not wear deodorant, lotions, powders, or perfumes.   Do not shave legs and under arms- 48 hours(2 days) prior to first CHG shower.(Shaving face and neck okay.)  Do not bring valuables to the hospital.(Hospital is not responsible for lost valuables).  Contacts, dentures or removable bridgework, body piercing, hair pins may not be worn into surgery.  Leave suitcase in the car. After surgery it may be brought to your room.  For patients admitted to the hospital, checkout time is 11:00 AM the day of discharge.(Restricted visitors-Any Persons displaying flu-like symptoms or illness).    Patients discharged the day of surgery will not be allowed to drive home. Must have responsible person with you x 24 hours once discharged.  Name and phone number of your driver: [id number] [phone] cell     Please read over the following fact sheets that you were given:  CHG(Chlorhexidine Gluconate 4% Surgical Soap) use, MRSA Information, Blood Transfusion fact sheet, Incentive Spirometry Instruction.  Remember : Type/Screen "Blue armbands" - may not be removed once applied(would result in being retested  AM of surgery, if removed).         [facility] - Preparing for Surgery Before surgery, you can play an important role.  Because skin is not sterile, your skin needs to be as free of germs as possible.  You can reduce the number of germs on your skin by washing with CHG (chlorahexidine gluconate) soap before surgery.  CHG is an antiseptic cleaner which kills germs and bonds with the skin to continue killing germs even after washing. Please DO NOT use if you have an allergy to CHG or antibacterial soaps.  If your skin becomes reddened/irritated stop using the CHG and inform your nurse when you arrive at Short Stay. Do not shave (including legs and underarms) for at least 48 hours prior to the first CHG shower.  You may shave your face/neck. Please follow these instructions carefully:  1.  Shower with CHG Soap the night before surgery and the  morning of Surgery.  2.  If you choose to wash your hair, wash your hair first as usual with your  normal  shampoo.  3.  After you shampoo, rinse your hair and body thoroughly to remove the  shampoo.                           4.  Use CHG as you would any other liquid soap.  You can apply chg directly  to the skin and wash  Gently with a scrungie or clean washcloth.  5.  Apply the CHG Soap to your body ONLY FROM THE NECK DOWN.   Do not use on face/ open                           Wound or open sores. Avoid contact with eyes, ears mouth and genitals (private parts).                       Wash face,  Genitals (private parts) with your normal soap.             6.  Wash thoroughly, paying special attention to the area where your surgery  will be performed.  7.  Thoroughly rinse your body with warm water from the neck down.  8.  DO NOT shower/wash with your normal soap after using and rinsing off  the CHG Soap.                9.  Pat yourself dry with a clean towel.            10.  Wear clean pajamas.            11.  Place clean sheets on your  bed the night of your first shower and do not  sleep with pets. Day of Surgery : Do not apply any lotions/deodorants the morning of surgery.  Please wear clean clothes to the hospital/surgery center.  FAILURE TO FOLLOW THESE INSTRUCTIONS MAY RESULT IN THE CANCELLATION OF YOUR SURGERY PATIENT SIGNATURE_________________________________  NURSE SIGNATURE__________________________________  ________________________________________________________________________   Bernard [name]  An incentive spirometer is a tool that can help keep your lungs clear and active. This tool measures how well you are filling your lungs with each breath. Taking long deep breaths may help reverse or decrease the chance of developing breathing (pulmonary) problems (especially infection) following:  A long period of time when you are unable to move or be active. BEFORE THE PROCEDURE   If the spirometer includes an indicator to show your best effort, your nurse or respiratory therapist will set it to a desired goal.  If possible, sit up straight or lean slightly forward. Try not to slouch.  Hold the incentive spirometer in an upright position. INSTRUCTIONS FOR USE   Sit on the edge of your bed if possible, or sit up as far as you can in bed or on a chair.  Hold the incentive spirometer in an upright position.  Breathe out normally.  Place the mouthpiece in your mouth and seal your lips tightly around it.  Breathe in slowly and as deeply as possible, raising the piston or the ball toward the top of the column.  Hold your breath for 3-5 seconds or for as long as possible. Allow the piston or ball to fall to the bottom of the column.  Remove the mouthpiece from your mouth and breathe out normally.  Rest for a few seconds and repeat Steps 1 through 7 at least 10 times every 1-2 hours when you are awake. Take your time and take a few normal breaths between deep breaths.  The spirometer may include an  indicator to show your best effort. Use the indicator as a goal to work toward during each repetition.  After each set of 10 deep breaths, practice coughing to be sure your lungs are clear. If you have an incision (the cut made at the time of  surgery), support your incision when coughing by placing a pillow or rolled up towels firmly against it. Once you are able to get out of bed, walk around indoors and cough well. You may stop using the incentive spirometer when instructed by your caregiver.  RISKS AND COMPLICATIONS  Take your time so you do not get dizzy or light-headed.  If you are in pain, you may need to take or ask for pain medication before doing incentive spirometry. It is harder to take a deep breath if you are having pain. AFTER USE  Rest and breathe slowly and easily.  It can be helpful to keep track of a log of your progress. Your caregiver can provide you with a simple table to help with this. If you are using the spirometer at home, follow these instructions: [place] IF:   You are having difficultly using the spirometer.  You have trouble using the spirometer as often as instructed.  Your pain medication is not giving enough relief while using the spirometer.  You develop fever of 100.5 F (38.1 C) or higher. SEEK IMMEDIATE MEDICAL CARE IF:   You cough up bloody sputum that had not been present before.  You develop fever of 102 F (38.9 C) or greater.  You develop worsening pain at or near the incision site. MAKE SURE YOU:   Understand these instructions.  Will watch your condition.  Will get help right away if you are not doing well or get worse. Document Released: 06/22/2006 Document Revised: 05/04/2011 Document Reviewed: 08/23/2006 Exit[employer] Patient Information 2014 [employer], [place].   ________________________________________________________________________  WHAT IS A BLOOD TRANSFUSION? Blood Transfusion Information  A transfusion is the  replacement of blood or some of its parts. Blood is made up of multiple cells which provide different functions.  Red blood cells carry oxygen and are used for blood loss replacement.  White blood cells fight against infection.  Platelets control bleeding.  Plasma helps clot blood.  Other blood products are available for specialized needs, such as hemophilia or other clotting disorders. BEFORE THE TRANSFUSION  Who gives blood for transfusions?   Healthy volunteers who are fully evaluated to make sure their blood is safe. This is blood bank blood. Transfusion therapy is the safest it has ever been in the practice of medicine. Before blood is taken from a donor, a complete history is taken to make sure that person has no history of diseases nor engages in risky social behavior (examples are intravenous drug use or sexual activity with multiple partners). The donor's travel history is screened to minimize risk of transmitting infections, such as malaria. The donated blood is tested for signs of infectious diseases, such as HIV and hepatitis. The blood is then tested to be sure it is compatible with you in order to minimize the chance of a transfusion reaction. If you or a relative donates blood, this is often done in anticipation of surgery and is not appropriate for emergency situations. It takes many days to process the donated blood. RISKS AND COMPLICATIONS Although transfusion therapy is very safe and saves many lives, the main dangers of transfusion include:   Getting an infectious disease.  Developing a transfusion reaction. This is an allergic reaction to something in the blood you were given. Every precaution is taken to prevent this. The decision to have a blood transfusion has been considered carefully by your caregiver before blood is given. Blood is not given unless the benefits outweigh the risks. AFTER THE TRANSFUSION  Right after receiving a blood transfusion, you will usually  feel much better and more energetic. This is especially true if your red blood cells have gotten low (anemic). The transfusion raises the level of the red blood cells which carry oxygen, and this usually causes an energy increase.  The nurse administering the transfusion will monitor you carefully for complications. HOME CARE INSTRUCTIONS  No special instructions are needed after a transfusion. You may find your energy is better. Speak with your caregiver about any limitations on activity for underlying diseases you may have. SEEK MEDICAL CARE IF:   Your condition is not improving after your transfusion.  You develop redness or irritation at the intravenous (IV) site. SEEK IMMEDIATE MEDICAL CARE IF:  Any of the following symptoms occur over the next 12 hours:  Shaking chills.  You have a temperature by mouth above 102 F (38.9 C), not controlled by medicine.  Chest, back, or muscle pain.  People around you feel you are not acting correctly or are confused.  Shortness of breath or difficulty breathing.  Dizziness and fainting.  You get a rash or develop hives.  You have a decrease in urine output.  Your urine turns a dark color or changes to pink, red, or brown. Any of the following symptoms occur over the next 10 days:  You have a temperature by mouth above 102 F (38.9 C), not controlled by medicine.  Shortness of breath.  Weakness after normal activity.  The white part of the eye turns yellow (jaundice).  You have a decrease in the amount of urine or are urinating less often.  Your urine turns a dark color or changes to pink, red, or brown. Document Released: 02/07/2000 Document Revised: 05/04/2011 Document Reviewed: 09/26/2007 [facility] Patient Information 2014 [place], [place].  _______________________________________________________________________

## 2014-09-14 NOTE — Pre-Procedure Instructions (Addendum)
EKG 10-12-13 Epic. CBC/d,CMP 09-07-14 Epic. CT Chest 7'16 Epic.Clearance with chart - Dr. Alain Marion, Dr. Rayann Heman. 09-14-14 1620 note sent to Dr. Anne Fu office [phone] to note PT/PTT- will repeat PT/INR AM of preop.

## 2014-09-23 ENCOUNTER — Ambulatory Visit: Payer: Self-pay | Admitting: Orthopedic Surgery

## 2014-09-23 NOTE — H&P (Signed)
Leul L. Florene Glen DOB: [birth year] Married / Language: English / Race: White Male Date of Admission:  09/24/2014 CC:  Left Knee Pain History of Present Illness The patient is a 81 year old male who comes in for a preoperative History and Physical. The patient is scheduled for a left total knee arthroplasty to be performed by Dr. Dione Plover. [name], MD at [facility] on 09-24-2014. The patient is a 81 year old male who reports left knee symptoms including: pain which began several month(s) (approximately) ago following a specific injury (felt pop in the knee with starting to take a walk). Prior to being seen, the patient was previously evaluated by Dr. Tamala Julian [facility] sports medicine). Past treatment for this problem has included intra-articular injection of corticosteroids (and aspiration, followed by visco that did not provide relief). Symptoms are reported to be located in the left knee and include knee pain, swelling and instability. Note for "Knee pain": History of Right TKA 10/21/09, also notes left posterior hip pain that started in January He states that the left knee has gotten progressively worse since January this year. He always has some discomfort with it, but it got really bad in January. He felt an episode where he felt a pop in knee and developed significant swelling. He saw his primary care practitioner, Dr. Tamala Julian who aspirated fluid and gave him a cortisone shot. The cortisone did not help and he subsequently had viscous supplement injections with no benefit. The knee is now hurting with all activities. When he is resting he tends to do well, but he is unable to do anything he desires because of knee pain. Any type of firm surface is very difficult for him to walk on. He has been found to have osteoarthritis of the that knee. He is ready to get the knee fixed. They have been treated conservatively in the past for the above stated problem and despite conservative measures, they continue to  have progressive pain and severe functional limitations and dysfunction. They have failed non-operative management including home exercise, medications, and injections. It is felt that they would benefit from undergoing total joint replacement. Risks and benefits of the procedure have been discussed with the patient and they elect to proceed with surgery. There are no active contraindications to surgery such as ongoing infection or rapidly progressive neurological disease.   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 Primary osteoarthritis of left knee (M17.12) Anxiety Disorder Cerebrovascular Accident 1990, March 2000 Cardiac Arrhythmia  Gastroesophageal Reflux Disease Migraine Headache Lung Cancer Myocardial infarction August 1976 Skin Cancer Prostate Disease Hypertension Heart murmur Atrial Fibrillation Hypercholesterolemia Urinary Tract Infection Past History Osteoporosis Measles Mumps  Allergies No Known Drug Allergies  Family History  Osteoporosis mother Osteoarthritis mother Depression father Cerebrovascular Accident father Diabetes Mellitus mother Hypertension mother Heart Disease grandfather mothers side Cancer sister  Social History  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 never smoker Drug/Alcohol Rehab (Currently) no Exercise Exercises daily; does running / walking Advance Directives Living Will, Healthcare POA  Medication History  Isometheptene-APAP-Dichloral (Oral) Specific dose unknown - Active. Simvastatin ('20MG'$  Tablet, Oral) Active. PriLOSEC ('20MG'$  Capsule DR, Oral) Active. Nitrostat (0.'4MG'$  Tab Sublingual, Sublingual) Active. Vitamin  D (1000UNIT Tablet, 1 (one) Oral) Active. Coumadin ('5MG'$  Tablet, Oral) Active.  Terazosin HCl ('5MG'$  Capsule, Oral) Active. Finasteride ('5MG'$  Tablet, Oral) Active. Diazepam ('5MG'$  Tablet, Oral) Active. Ocuvite Adult 50+ (Oral) Active. Amiodarone HCl ('200MG'$  Tablet, Oral) Active.  Past Surgical History Lung Surgery  Date: 08/28/2008. removed right middle lobe for cancer Inguinal Hernia Repair Date: 02/1969. open: left Total Knee Replacement Date: 10/21/2009. right Vasectomy Arthroscopy of Knee Date: 08/03/2008. right Foot Surgery Date: 07/1987. left bunion Cataract Surgery bilateral; Right - 05/29/2009, Left - 05/01/2009   Review of Systems General Not Present- Chills, Fatigue, Fever, Memory Loss, Night Sweats, Weight Gain and Weight Loss. Skin Not Present- Eczema, Hives, Itching, Lesions and Rash. HEENT Present- Headache. Not Present- Dentures, Double Vision, Hearing Loss, Tinnitus and Visual Loss. Respiratory Not Present- Allergies, Chronic Cough, Coughing up blood, Shortness of breath at rest and Shortness of breath with exertion. Cardiovascular Not Present- Chest Pain, Difficulty Breathing Lying Down, Murmur, Palpitations, Racing/skipping heartbeats and Swelling. Gastrointestinal Present- Constipation. Not Present- Abdominal Pain, Bloody Stool, Diarrhea, Difficulty Swallowing, Heartburn, Jaundice, Loss of appetitie, Nausea and Vomiting. Male Genitourinary Present- Blood in Urine (small amount), Urinary frequency and Urinating at Night. Not Present- Discharge, Flank Pain, Incontinence, Painful Urination, Urgency, Urinary Retention and Weak urinary stream. Musculoskeletal Present- Joint Pain. Not Present- Back Pain, Joint Swelling, Morning Stiffness, Muscle Pain, Muscle Weakness and Spasms. Neurological Not Present- Blackout spells, Difficulty with balance, Dizziness, Paralysis, Tremor and Weakness. Psychiatric Not Present- Insomnia.  Vitals Weight: 210 lb Height: 70in Weight was  reported by patient. Height was reported by patient. Body Surface Area: 2.13 m Body Mass Index: 30.13 kg/m  BP: 114/64 (Sitting, Right Arm, Standard)  Physical Exam General Mental Status -Alert, cooperative and good historian. General Appearance-pleasant, Not in acute distress. Orientation-Oriented X3. Build & Nutrition-Well nourished and Well developed.  Head and Neck Head-normocephalic, atraumatic . Neck Global Assessment - supple, no bruit auscultated on the right, no bruit auscultated on the left.  Eye Vision-Wears corrective lenses. Pupil - Bilateral-Regular and Round. Motion - Bilateral-EOMI.  Chest and Lung Exam Auscultation Breath sounds - clear at anterior chest wall and clear at posterior chest wall. Adventitious sounds - No Adventitious sounds.  Cardiovascular Auscultation Rhythm - Regular rate and rhythm(with only one ectopic or extra beat (history of A.fib)). Heart Sounds - S1 WNL and S2 WNL. Murmurs & Other Heart Sounds - Auscultation of the heart reveals - No Murmurs.  Abdomen Inspection Contour - Generalized mild distention. Palpation/Percussion Tenderness - Abdomen is non-tender to palpation. Rigidity (guarding) - Abdomen is soft. Auscultation Auscultation of the abdomen reveals - Bowel sounds normal.  Male Genitourinary Note: Not done, not pertinent to present illness   Musculoskeletal Note: On exam, he is alert and oriented, in no apparent distress. His right knee looks great. There is no swelling, range 5 to 120 with no tenderness or instability. Left knee, no effusion, range 5 to 125. Marked crepitus on range of motion. Tenderness medial greater than lateral with no instability noted. Pulses sensation and motor are intact.  RADIOGRAPHS AP both knees and lateral show bone-on-bone arthritis in medial and patellofemoral compartments of the left knee. His prosthesis on the right is in excellent position with no  abnormalities.   Assessment & Plan Primary osteoarthritis of left knee (M17.12)  Started Lovenox '150MG'$ /ML, 1 (one) Syringe daily at 8 am, 4 Syringe Local Order: Take injections for 4 days (Friday 7/28 thru Sunday 7/31).  Note:Surgical Plans: Left Total Knee Replacement  Disposition: Home  PCP: Dr. Alain Marion - Patient has been seen preoperatively and felt to be  stable for surgery. Cards: Dr. Rayann Heman - Patient has been seen preoperatively and felt to be stable for surgery. "Would need Lovenox bridge off of coumadin."  Topical TXA - MI  Anesthesia Issues: None  Comments:  Patient requested a CPM for home. He will start outpatient therapy at [facility] on 10/05/2014.  Signed electronically by Ok Edwards, III PA-C

## 2014-09-24 ENCOUNTER — Encounter (HOSPITAL_COMMUNITY)
Admission: RE | Disposition: A | Discharge status: Home Health | Payer: Self-pay | Source: Ambulatory Visit | Attending: Orthopedic Surgery

## 2014-09-24 ENCOUNTER — Encounter (HOSPITAL_COMMUNITY): Payer: Self-pay | Admitting: *Deleted

## 2014-09-24 ENCOUNTER — Inpatient Hospital Stay (HOSPITAL_COMMUNITY): Payer: MEDICARE | Admitting: Anesthesiology

## 2014-09-24 ENCOUNTER — Inpatient Hospital Stay (HOSPITAL_COMMUNITY)
Admission: RE | Admit: 2014-09-24 | Discharge: 2014-09-26 | DRG: 470 | Disposition: A | Discharge status: Home Health | Payer: MEDICARE | Source: Ambulatory Visit | Attending: Orthopedic Surgery | Admitting: Orthopedic Surgery

## 2014-09-24 DIAGNOSIS — I252 Old myocardial infarction: Secondary | ICD-10-CM

## 2014-09-24 DIAGNOSIS — Z01812 Encounter for preprocedural laboratory examination: Secondary | ICD-10-CM | POA: Diagnosis not present

## 2014-09-24 DIAGNOSIS — I2510 Atherosclerotic heart disease of native coronary artery without angina pectoris: Secondary | ICD-10-CM | POA: Diagnosis present

## 2014-09-24 DIAGNOSIS — M1712 Unilateral primary osteoarthritis, left knee: Secondary | ICD-10-CM

## 2014-09-24 DIAGNOSIS — E785 Hyperlipidemia, unspecified: Secondary | ICD-10-CM | POA: Diagnosis present

## 2014-09-24 DIAGNOSIS — M810 Age-related osteoporosis without current pathological fracture: Secondary | ICD-10-CM | POA: Diagnosis present

## 2014-09-24 DIAGNOSIS — I739 Peripheral vascular disease, unspecified: Secondary | ICD-10-CM | POA: Diagnosis present

## 2014-09-24 DIAGNOSIS — Z85118 Personal history of other malignant neoplasm of bronchus and lung: Secondary | ICD-10-CM

## 2014-09-24 DIAGNOSIS — F419 Anxiety disorder, unspecified: Secondary | ICD-10-CM | POA: Diagnosis present

## 2014-09-24 DIAGNOSIS — M179 Osteoarthritis of knee, unspecified: Secondary | ICD-10-CM | POA: Diagnosis present

## 2014-09-24 DIAGNOSIS — I10 Essential (primary) hypertension: Secondary | ICD-10-CM | POA: Diagnosis present

## 2014-09-24 DIAGNOSIS — K219 Gastro-esophageal reflux disease without esophagitis: Secondary | ICD-10-CM | POA: Diagnosis present

## 2014-09-24 DIAGNOSIS — Z8249 Family history of ischemic heart disease and other diseases of the circulatory system: Secondary | ICD-10-CM | POA: Diagnosis not present

## 2014-09-24 DIAGNOSIS — M25562 Pain in left knee: Secondary | ICD-10-CM | POA: Diagnosis present

## 2014-09-24 DIAGNOSIS — Z8673 Personal history of transient ischemic attack (TIA), and cerebral infarction without residual deficits: Secondary | ICD-10-CM | POA: Diagnosis not present

## 2014-09-24 DIAGNOSIS — Z79899 Other long term (current) drug therapy: Secondary | ICD-10-CM | POA: Diagnosis not present

## 2014-09-24 DIAGNOSIS — Z96651 Presence of right artificial knee joint: Secondary | ICD-10-CM | POA: Diagnosis present

## 2014-09-24 DIAGNOSIS — M1710 Unilateral primary osteoarthritis, unspecified knee: Secondary | ICD-10-CM | POA: Diagnosis present

## 2014-09-24 HISTORY — PX: TOTAL KNEE ARTHROPLASTY: SHX125

## 2014-09-24 HISTORY — DX: Cerebral infarction, unspecified: I63.9

## 2014-09-24 LAB — TYPE AND SCREEN
ABO/RH(D): A POS
ANTIBODY SCREEN: NEGATIVE

## 2014-09-24 LAB — PROTIME-INR
INR: 1.11 (ref 0.00–1.49)
Prothrombin Time: 14.5 seconds (ref 11.6–15.2)

## 2014-09-24 SURGERY — ARTHROPLASTY, KNEE, TOTAL
Anesthesia: Monitor Anesthesia Care | Site: Knee | Laterality: Left

## 2014-09-24 MED ORDER — ONDANSETRON HCL 4 MG PO TABS
4.0000 mg | ORAL_TABLET | Freq: Four times a day (QID) | ORAL | Status: DC | PRN
Start: 2014-09-24 — End: 2014-09-26

## 2014-09-24 MED ORDER — DEXTROSE-NACL 5-0.9 % IV SOLN
INTRAVENOUS | Status: DC
Start: 2014-09-24 — End: 2014-09-26
  Administered 2014-09-24: 100 mL/h via INTRAVENOUS
  Administered 2014-09-25: 05:00:00 via INTRAVENOUS

## 2014-09-24 MED ORDER — PROPOFOL 10 MG/ML IV BOLUS
INTRAVENOUS | Status: AC
  Filled 2014-09-24: qty 20

## 2014-09-24 MED ORDER — METHOCARBAMOL 500 MG PO TABS
500.0000 mg | ORAL_TABLET | Freq: Four times a day (QID) | ORAL | Status: DC | PRN
Start: 2014-09-24 — End: 2014-09-26
  Administered 2014-09-24 – 2014-09-25 (×2): 500 mg via ORAL
  Filled 2014-09-24 (×2): qty 1

## 2014-09-24 MED ORDER — TRAMADOL HCL 50 MG PO TABS: 50.0000 mg | ORAL_TABLET | Freq: Four times a day (QID) | ORAL | Status: DC | PRN

## 2014-09-24 MED ORDER — BUPIVACAINE HCL 0.25 % IJ SOLN
INTRAMUSCULAR | Status: DC | PRN
  Administered 2014-09-24: 30 mL

## 2014-09-24 MED ORDER — SODIUM CHLORIDE 0.9 % IJ SOLN
INTRAMUSCULAR | Status: AC
  Filled 2014-09-24: qty 50

## 2014-09-24 MED ORDER — LIDOCAINE HCL (CARDIAC) 20 MG/ML IV SOLN
INTRAVENOUS | Status: DC | PRN
  Administered 2014-09-24: 100 mg via INTRAVENOUS

## 2014-09-24 MED ORDER — ACETAMINOPHEN 10 MG/ML IV SOLN
1000.0000 mg | Freq: Once | INTRAVENOUS | Status: AC
  Administered 2014-09-24: 1000 mg via INTRAVENOUS
  Filled 2014-09-24: qty 100

## 2014-09-24 MED ORDER — BUPIVACAINE LIPOSOME 1.3 % IJ SUSP
INTRAMUSCULAR | Status: DC | PRN
  Administered 2014-09-24: 20 mL

## 2014-09-24 MED ORDER — LIDOCAINE HCL (CARDIAC) 20 MG/ML IV SOLN
INTRAVENOUS | Status: AC
  Filled 2014-09-24: qty 5

## 2014-09-24 MED ORDER — DOCUSATE SODIUM 100 MG PO CAPS
100.0000 mg | ORAL_CAPSULE | Freq: Two times a day (BID) | ORAL | Status: DC
Start: 2014-09-24 — End: 2014-09-26
  Administered 2014-09-24 – 2014-09-26 (×4): 100 mg via ORAL

## 2014-09-24 MED ORDER — TERAZOSIN HCL 5 MG PO CAPS
5.0000 mg | ORAL_CAPSULE | ORAL | Status: DC
  Administered 2014-09-25: 5 mg via ORAL
  Filled 2014-09-24: qty 1

## 2014-09-24 MED ORDER — WARFARIN - PHARMACIST DOSING INPATIENT: Freq: Every day | Status: DC

## 2014-09-24 MED ORDER — PROPOFOL INFUSION 10 MG/ML OPTIME
INTRAVENOUS | Status: DC | PRN
  Administered 2014-09-24: 50 ug/kg/min via INTRAVENOUS

## 2014-09-24 MED ORDER — WARFARIN SODIUM 5 MG PO TABS
5.0000 mg | ORAL_TABLET | Freq: Once | ORAL | Status: AC
  Administered 2014-09-24: 5 mg via ORAL
  Filled 2014-09-24: qty 1

## 2014-09-24 MED ORDER — BISACODYL 10 MG RE SUPP: 10.0000 mg | Freq: Every day | RECTAL | Status: DC | PRN

## 2014-09-24 MED ORDER — DIPHENHYDRAMINE HCL 12.5 MG/5ML PO ELIX: 12.5000 mg | ORAL_SOLUTION | ORAL | Status: DC | PRN

## 2014-09-24 MED ORDER — BUPIVACAINE LIPOSOME 1.3 % IJ SUSP
20.0000 mL | Freq: Once | INTRAMUSCULAR | Status: DC
  Filled 2014-09-24: qty 20

## 2014-09-24 MED ORDER — ACETAMINOPHEN 500 MG PO TABS
1000.0000 mg | ORAL_TABLET | Freq: Four times a day (QID) | ORAL | Status: AC
  Administered 2014-09-24 – 2014-09-25 (×4): 1000 mg via ORAL
  Filled 2014-09-24 (×4): qty 2

## 2014-09-24 MED ORDER — FLEET ENEMA 7-19 GM/118ML RE ENEM: 1.0000 | ENEMA | Freq: Once | RECTAL | Status: AC | PRN

## 2014-09-24 MED ORDER — ONDANSETRON HCL 4 MG/2ML IJ SOLN: 4.0000 mg | Freq: Four times a day (QID) | INTRAMUSCULAR | Status: DC | PRN

## 2014-09-24 MED ORDER — FENTANYL CITRATE (PF) 100 MCG/2ML IJ SOLN
INTRAMUSCULAR | Status: DC | PRN
  Administered 2014-09-24 (×2): 50 ug via INTRAVENOUS

## 2014-09-24 MED ORDER — PHENOL 1.4 % MT LIQD: 1.0000 | OROMUCOSAL | Status: DC | PRN

## 2014-09-24 MED ORDER — TRANEXAMIC ACID 1000 MG/10ML IV SOLN
2000.0000 mg | INTRAVENOUS | Status: DC | PRN
  Administered 2014-09-24: 2000 mg via TOPICAL

## 2014-09-24 MED ORDER — MIDAZOLAM HCL 2 MG/2ML IJ SOLN
INTRAMUSCULAR | Status: AC
  Filled 2014-09-24: qty 2

## 2014-09-24 MED ORDER — PANTOPRAZOLE SODIUM 40 MG PO TBEC
40.0000 mg | DELAYED_RELEASE_TABLET | ORAL | Status: DC
  Administered 2014-09-25: 40 mg via ORAL
  Filled 2014-09-24 (×2): qty 1

## 2014-09-24 MED ORDER — ACETAMINOPHEN 10 MG/ML IV SOLN
INTRAVENOUS | Status: AC
  Filled 2014-09-24: qty 100

## 2014-09-24 MED ORDER — POLYETHYLENE GLYCOL 3350 17 G PO PACK: 17.0000 g | PACK | Freq: Every day | ORAL | Status: DC | PRN

## 2014-09-24 MED ORDER — DEXAMETHASONE SODIUM PHOSPHATE 10 MG/ML IJ SOLN
10.0000 mg | Freq: Once | INTRAMUSCULAR | Status: AC
  Administered 2014-09-24: 10 mg via INTRAVENOUS

## 2014-09-24 MED ORDER — ONDANSETRON HCL 4 MG/2ML IJ SOLN
INTRAMUSCULAR | Status: DC | PRN
  Administered 2014-09-24: 4 mg via INTRAVENOUS

## 2014-09-24 MED ORDER — FINASTERIDE 5 MG PO TABS
5.0000 mg | ORAL_TABLET | Freq: Every day | ORAL | Status: DC
  Administered 2014-09-24 – 2014-09-26 (×3): 5 mg via ORAL
  Filled 2014-09-24 (×3): qty 1

## 2014-09-24 MED ORDER — DEXTROSE 5 % IV SOLN
500.0000 mg | Freq: Four times a day (QID) | INTRAVENOUS | Status: DC | PRN
  Administered 2014-09-24: 500 mg via INTRAVENOUS
  Filled 2014-09-24 (×2): qty 5

## 2014-09-24 MED ORDER — CEFAZOLIN SODIUM-DEXTROSE 2-3 GM-% IV SOLR
2.0000 g | INTRAVENOUS | Status: AC
  Administered 2014-09-24: 2 g via INTRAVENOUS

## 2014-09-24 MED ORDER — 0.9 % SODIUM CHLORIDE (POUR BTL) OPTIME
TOPICAL | Status: DC | PRN
  Administered 2014-09-24: 1000 mL

## 2014-09-24 MED ORDER — SODIUM CHLORIDE 0.9 % IV SOLN: INTRAVENOUS | Status: DC

## 2014-09-24 MED ORDER — SODIUM CHLORIDE 0.9 % IR SOLN
Status: DC | PRN
  Administered 2014-09-24: 1000 mL

## 2014-09-24 MED ORDER — DIAZEPAM 5 MG PO TABS: 5.0000 mg | ORAL_TABLET | Freq: Two times a day (BID) | ORAL | Status: DC

## 2014-09-24 MED ORDER — MORPHINE SULFATE 10 MG/ML IJ SOLN: 1.0000 mg | INTRAMUSCULAR | Status: DC | PRN

## 2014-09-24 MED ORDER — CEFAZOLIN SODIUM-DEXTROSE 2-3 GM-% IV SOLR
2.0000 g | Freq: Four times a day (QID) | INTRAVENOUS | Status: AC
  Administered 2014-09-24 (×2): 2 g via INTRAVENOUS
  Filled 2014-09-24 (×2): qty 50

## 2014-09-24 MED ORDER — DEXAMETHASONE SODIUM PHOSPHATE 10 MG/ML IJ SOLN
10.0000 mg | Freq: Once | INTRAMUSCULAR | Status: AC
  Administered 2014-09-25: 10 mg via INTRAVENOUS
  Filled 2014-09-24: qty 1

## 2014-09-24 MED ORDER — CHLORHEXIDINE GLUCONATE 4 % EX LIQD: 60.0000 mL | Freq: Once | CUTANEOUS | Status: DC

## 2014-09-24 MED ORDER — HYDROMORPHONE HCL 1 MG/ML IJ SOLN: 0.2500 mg | INTRAMUSCULAR | Status: DC | PRN

## 2014-09-24 MED ORDER — METOCLOPRAMIDE HCL 5 MG/ML IJ SOLN: 5.0000 mg | Freq: Three times a day (TID) | INTRAMUSCULAR | Status: DC | PRN

## 2014-09-24 MED ORDER — CEFAZOLIN SODIUM-DEXTROSE 2-3 GM-% IV SOLR
INTRAVENOUS | Status: AC
  Filled 2014-09-24: qty 50

## 2014-09-24 MED ORDER — SIMVASTATIN 20 MG PO TABS
20.0000 mg | ORAL_TABLET | Freq: Every day | ORAL | Status: DC
  Administered 2014-09-24 – 2014-09-26 (×3): 20 mg via ORAL
  Filled 2014-09-24 (×3): qty 1

## 2014-09-24 MED ORDER — BUPIVACAINE HCL (PF) 0.25 % IJ SOLN
INTRAMUSCULAR | Status: AC
  Filled 2014-09-24: qty 30

## 2014-09-24 MED ORDER — ENOXAPARIN SODIUM 30 MG/0.3ML ~~LOC~~ SOLN
30.0000 mg | Freq: Two times a day (BID) | SUBCUTANEOUS | Status: DC
  Administered 2014-09-25 – 2014-09-26 (×3): 30 mg via SUBCUTANEOUS
  Filled 2014-09-24 (×5): qty 0.3

## 2014-09-24 MED ORDER — FENTANYL CITRATE (PF) 100 MCG/2ML IJ SOLN
INTRAMUSCULAR | Status: AC
  Filled 2014-09-24: qty 2

## 2014-09-24 MED ORDER — EPHEDRINE SULFATE 50 MG/ML IJ SOLN
INTRAMUSCULAR | Status: DC | PRN
  Administered 2014-09-24: 5 mg via INTRAVENOUS

## 2014-09-24 MED ORDER — TRANEXAMIC ACID 1000 MG/10ML IV SOLN
2000.0000 mg | Freq: Once | INTRAVENOUS | Status: DC
  Filled 2014-09-24: qty 20

## 2014-09-24 MED ORDER — ACETAMINOPHEN 325 MG PO TABS: 650.0000 mg | ORAL_TABLET | Freq: Four times a day (QID) | ORAL | Status: DC | PRN

## 2014-09-24 MED ORDER — BUPIVACAINE IN DEXTROSE 0.75-8.25 % IT SOLN
INTRATHECAL | Status: DC | PRN
  Administered 2014-09-24: 15 mg via INTRATHECAL

## 2014-09-24 MED ORDER — LACTATED RINGERS IV SOLN
INTRAVENOUS | Status: DC
Start: 2014-09-24 — End: 2014-09-24
  Administered 2014-09-24: 1000 mL via INTRAVENOUS

## 2014-09-24 MED ORDER — NITROGLYCERIN 0.4 MG SL SUBL: 0.4000 mg | SUBLINGUAL_TABLET | SUBLINGUAL | Status: DC | PRN

## 2014-09-24 MED ORDER — METOCLOPRAMIDE HCL 10 MG PO TABS: 5.0000 mg | ORAL_TABLET | Freq: Three times a day (TID) | ORAL | Status: DC | PRN

## 2014-09-24 MED ORDER — ACETAMINOPHEN 650 MG RE SUPP: 650.0000 mg | Freq: Four times a day (QID) | RECTAL | Status: DC | PRN

## 2014-09-24 MED ORDER — MIDAZOLAM HCL 5 MG/5ML IJ SOLN
INTRAMUSCULAR | Status: DC | PRN
  Administered 2014-09-24 (×2): 0.5 mg via INTRAVENOUS

## 2014-09-24 MED ORDER — SODIUM CHLORIDE 0.9 % IJ SOLN
INTRAMUSCULAR | Status: DC | PRN
Start: 2014-09-24 — End: 2014-09-24
  Administered 2014-09-24: 30 mL

## 2014-09-24 MED ORDER — MENTHOL 3 MG MT LOZG: 1.0000 | LOZENGE | OROMUCOSAL | Status: DC | PRN

## 2014-09-24 MED ORDER — OXYCODONE HCL 5 MG PO TABS
5.0000 mg | ORAL_TABLET | ORAL | Status: DC | PRN
  Administered 2014-09-24: 10 mg via ORAL
  Administered 2014-09-24: 5 mg via ORAL
  Administered 2014-09-24 – 2014-09-25 (×3): 10 mg via ORAL
  Administered 2014-09-25: 5 mg via ORAL
  Administered 2014-09-25: 10 mg via ORAL
  Administered 2014-09-25: 5 mg via ORAL
  Administered 2014-09-26 (×3): 10 mg via ORAL
  Filled 2014-09-24 (×4): qty 2
  Filled 2014-09-24 (×2): qty 1
  Filled 2014-09-24 (×2): qty 2
  Filled 2014-09-24: qty 1
  Filled 2014-09-24 (×2): qty 2

## 2014-09-24 MED ORDER — DEXAMETHASONE SODIUM PHOSPHATE 10 MG/ML IJ SOLN
INTRAMUSCULAR | Status: AC
  Filled 2014-09-24: qty 1

## 2014-09-24 SURGICAL SUPPLY — 65 items
BAG DECANTER FOR FLEXI CONT (MISCELLANEOUS) ×3 IMPLANT
BAG SPEC THK2 15X12 ZIP CLS (MISCELLANEOUS) ×1
BAG ZIPLOCK 12X15 (MISCELLANEOUS) ×3 IMPLANT
BANDAGE ELASTIC 6 VELCRO ST LF (GAUZE/BANDAGES/DRESSINGS) ×3 IMPLANT
BANDAGE ESMARK 6X9 LF (GAUZE/BANDAGES/DRESSINGS) ×1 IMPLANT
BLADE SAG 18X100X1.27 (BLADE) ×3 IMPLANT
BLADE SAW SGTL 11.0X1.19X90.0M (BLADE) ×3 IMPLANT
BNDG CMPR 9X6 STRL LF SNTH (GAUZE/BANDAGES/DRESSINGS) ×1
BNDG ESMARK 6X9 LF (GAUZE/BANDAGES/DRESSINGS) ×3
BOWL SMART MIX CTS (DISPOSABLE) ×3 IMPLANT
CAP KNEE TOTAL 3 SIGMA ×2 IMPLANT
CEMENT HV SMART SET (Cement) ×6 IMPLANT
CLOSURE WOUND 1/2 X4 (GAUZE/BANDAGES/DRESSINGS) ×1
CUFF TOURN SGL QUICK 34 (TOURNIQUET CUFF) ×3
CUFF TRNQT CYL 34X4X40X1 (TOURNIQUET CUFF) ×1 IMPLANT
DECANTER SPIKE VIAL GLASS SM (MISCELLANEOUS) ×3 IMPLANT
DRAPE EXTREMITY T 121X128X90 (DRAPE) ×3 IMPLANT
DRAPE POUCH INSTRU U-SHP 10X18 (DRAPES) ×3 IMPLANT
DRAPE U-SHAPE 47X51 STRL (DRAPES) ×3 IMPLANT
DRSG ADAPTIC 3X8 NADH LF (GAUZE/BANDAGES/DRESSINGS) ×3 IMPLANT
DRSG PAD ABDOMINAL 8X10 ST (GAUZE/BANDAGES/DRESSINGS) ×3 IMPLANT
DURAPREP 26ML APPLICATOR (WOUND CARE) ×3 IMPLANT
ELECT REM PT RETURN 9FT ADLT (ELECTROSURGICAL) ×3
ELECTRODE REM PT RTRN 9FT ADLT (ELECTROSURGICAL) ×1 IMPLANT
EVACUATOR 1/8 PVC DRAIN (DRAIN) ×3 IMPLANT
FACESHIELD WRAPAROUND (MASK) ×15 IMPLANT
FACESHIELD WRAPAROUND OR TEAM (MASK) ×5 IMPLANT
GAUZE SPONGE 4X4 12PLY STRL (GAUZE/BANDAGES/DRESSINGS) ×3 IMPLANT
GLOVE BIO SURGEON STRL SZ7.5 (GLOVE) IMPLANT
GLOVE BIO SURGEON STRL SZ8 (GLOVE) ×3 IMPLANT
GLOVE BIOGEL PI IND STRL 6.5 (GLOVE) IMPLANT
GLOVE BIOGEL PI IND STRL 8 (GLOVE) ×1 IMPLANT
GLOVE BIOGEL PI INDICATOR 6.5 (GLOVE)
GLOVE BIOGEL PI INDICATOR 8 (GLOVE) ×2
GLOVE SURG SS PI 6.5 STRL IVOR (GLOVE) IMPLANT
GOWN STRL REUS W/TWL LRG LVL3 (GOWN DISPOSABLE) ×3 IMPLANT
GOWN STRL REUS W/TWL XL LVL3 (GOWN DISPOSABLE) IMPLANT
HANDPIECE INTERPULSE COAX TIP (DISPOSABLE) ×3
IMMOBILIZER KNEE 20 (SOFTGOODS) ×3
IMMOBILIZER KNEE 20 THIGH 36 (SOFTGOODS) ×1 IMPLANT
KIT BASIN OR (CUSTOM PROCEDURE TRAY) ×3 IMPLANT
MANIFOLD NEPTUNE II (INSTRUMENTS) ×3 IMPLANT
NDL SAFETY ECLIPSE 18X1.5 (NEEDLE) ×2 IMPLANT
NEEDLE HYPO 18GX1.5 SHARP (NEEDLE) ×6
NS IRRIG 1000ML POUR BTL (IV SOLUTION) ×3 IMPLANT
PACK TOTAL JOINT (CUSTOM PROCEDURE TRAY) ×3 IMPLANT
PAD ABD 8X10 STRL (GAUZE/BANDAGES/DRESSINGS) ×2 IMPLANT
PADDING CAST COTTON 6X4 STRL (CAST SUPPLIES) ×5 IMPLANT
PEN SKIN MARKING BROAD (MISCELLANEOUS) ×3 IMPLANT
POSITIONER SURGICAL ARM (MISCELLANEOUS) ×3 IMPLANT
SET HNDPC FAN SPRY TIP SCT (DISPOSABLE) ×1 IMPLANT
STRIP CLOSURE SKIN 1/2X4 (GAUZE/BANDAGES/DRESSINGS) ×3 IMPLANT
SUCTION FRAZIER 12FR DISP (SUCTIONS) ×3 IMPLANT
SUT MNCRL AB 4-0 PS2 18 (SUTURE) ×3 IMPLANT
SUT VIC AB 2-0 CT1 27 (SUTURE) ×9
SUT VIC AB 2-0 CT1 TAPERPNT 27 (SUTURE) ×3 IMPLANT
SUT VLOC 180 0 24IN GS25 (SUTURE) ×3 IMPLANT
SYR 20CC LL (SYRINGE) ×3 IMPLANT
SYR 50ML LL SCALE MARK (SYRINGE) ×3 IMPLANT
TOWEL OR 17X26 10 PK STRL BLUE (TOWEL DISPOSABLE) ×3 IMPLANT
TOWEL OR NON WOVEN STRL DISP B (DISPOSABLE) IMPLANT
TRAY FOLEY CATH 16FRSI W/METER (SET/KITS/TRAYS/PACK) ×2 IMPLANT
WATER STERILE IRR 1500ML POUR (IV SOLUTION) ×3 IMPLANT
WRAP KNEE MAXI GEL POST OP (GAUZE/BANDAGES/DRESSINGS) ×3 IMPLANT
YANKAUER SUCT BULB TIP 10FT TU (MISCELLANEOUS) ×3 IMPLANT

## 2014-09-24 NOTE — Anesthesia Preprocedure Evaluation (Addendum)
Anesthesia Evaluation  Patient identified by MRN, date of birth, ID band Patient awake    Reviewed: Allergy & Precautions, H&P , NPO status , Patient's Chart, lab work & pertinent test results  Airway Mallampati: III  TM Distance: >3 FB Neck ROM: Full    Dental no notable dental hx. (+) Teeth Intact, Dental Advisory Given   Pulmonary neg pulmonary ROS,  breath sounds clear to auscultation  Pulmonary exam normal       Cardiovascular hypertension, Pt. on medications + CAD and + Peripheral Vascular Disease + dysrhythmias Atrial Fibrillation Rhythm:Regular Rate:Normal     Neuro/Psych  Headaches, Depression TIACVA, No Residual Symptoms    GI/Hepatic Neg liver ROS, GERD-  Medicated and Controlled,  Endo/Other  negative endocrine ROS  Renal/GU Renal disease  negative genitourinary   Musculoskeletal  (+) Arthritis -, Osteoarthritis,    Abdominal   Peds  Hematology negative hematology ROS (+)   Anesthesia Other Findings   Reproductive/Obstetrics negative OB ROS                           Anesthesia Physical Anesthesia Plan  ASA: III  Anesthesia Plan: MAC and Spinal   Post-op Pain Management:    Induction: Intravenous  Airway Management Planned: Simple Face Mask  Additional Equipment:   Intra-op Plan:   Post-operative Plan:   Informed Consent: I have reviewed the patients History and Physical, chart, labs and discussed the procedure including the risks, benefits and alternatives for the proposed anesthesia with the patient or authorized representative who has indicated his/her understanding and acceptance.   Dental advisory given  Plan Discussed with: CRNA  Anesthesia Plan Comments:         Anesthesia Quick Evaluation

## 2014-09-24 NOTE — Anesthesia Procedure Notes (Addendum)
Spinal Patient location during procedure: OR Start time: 09/24/2014 10:26 AM End time: 09/24/2014 10:32 AM Staffing Anesthesiologist: Roderic Palau Performed by: anesthesiologist  Preanesthetic Checklist Completed: patient identified, surgical consent, pre-op evaluation, timeout performed, IV checked, risks and benefits discussed and monitors and equipment checked Spinal Block Patient position: sitting Prep: DuraPrep Patient monitoring: cardiac monitor, continuous pulse ox and blood pressure Approach: midline Location: L3-4 Injection technique: single-shot Needle Needle type: Spinocan  Needle gauge: 22 G Needle length: 9 cm Assessment Sensory level: T6 Additional Notes Functioning IV was confirmed and monitors were applied. Sterile prep and drape, including hand hygiene and sterile gloves were used. The patient was positioned and the spine was prepped. The skin was anesthetized with lidocaine.  Free flow of clear CSF was obtained prior to injecting local anesthetic into the CSF.  The spinal needle aspirated freely following injection.  The needle was carefully withdrawn.  The patient tolerated the procedure well.   Procedure Name: MAC Date/Time: 09/24/2014 10:20 AM Performed by: Carleene Cooper A Pre-anesthesia Checklist: Patient identified, Emergency Drugs available, Timeout performed, Suction available and Patient being monitored Patient Re-evaluated:Patient Re-evaluated prior to inductionOxygen Delivery Method: Simple face mask Dental Injury: Teeth and Oropharynx as per pre-operative assessment

## 2014-09-24 NOTE — H&P (View-Only) (Signed)
Bernard [name] DOB: Sep 21, 1932 Married / Language: English / Race: White Male Date of Admission:  09/24/2014 CC:  Left Knee Pain History of Present Illness The patient is a 81 year old male who comes in for a preoperative History and Physical. The patient is scheduled for a left total knee arthroplasty to be performed by Dr. Dione [name]. [name], MD at [facility] on 09-24-2014. The patient is a 81 year old male who reports left knee symptoms including: pain which began several month(s) (approximately) ago following a specific injury (felt pop in the knee with starting to take a walk). Prior to being seen, the patient was previously evaluated by Dr. Tamala [name] [facility] sports medicine). Past treatment for this problem has included intra-articular injection of corticosteroids (and aspiration, followed by visco that did not provide relief). Symptoms are reported to be located in the left knee and include knee pain, swelling and instability. Note for "Knee pain": History of Right TKA 10/21/09, also notes left posterior hip pain that started in January He states that the left knee has gotten progressively worse since January this year. He always has some discomfort with it, but it got really bad in January. He felt an episode where he felt a pop in knee and developed significant swelling. He saw his primary care practitioner, Dr. Tamala [name] who aspirated fluid and gave him a cortisone shot. The cortisone did not help and he subsequently had viscous supplement injections with no benefit. The knee is now hurting with all activities. When he is resting he tends to do well, but he is unable to do anything he desires because of knee pain. Any type of firm surface is very difficult for him to walk on. He has been found to have osteoarthritis of the that knee. He is ready to get the knee fixed. They have been treated conservatively in the past for the above stated problem and despite conservative measures, they continue to  have progressive pain and severe functional limitations and dysfunction. They have failed non-operative management including home exercise, medications, and injections. It is felt that they would benefit from undergoing total joint replacement. Risks and benefits of the procedure have been discussed with the patient and they elect to proceed with surgery. There are no active contraindications to surgery such as ongoing infection or rapidly progressive neurological disease.   Problem List/Past Medical Status post total right knee replacement (E45.409) right Primary osteoarthritis of left knee (M17.12) Anxiety Disorder Cerebrovascular Accident 1990, March 2000 Cardiac Arrhythmia  Gastroesophageal Reflux Disease Migraine Headache Lung Cancer Myocardial infarction August 1976 Skin Cancer Prostate Disease Hypertension Heart murmur Atrial Fibrillation Hypercholesterolemia Urinary Tract Infection Past History Osteoporosis Measles Mumps  Allergies No Known Drug Allergies  Family History  Osteoporosis mother Osteoarthritis mother Depression father Cerebrovascular Accident father Diabetes Mellitus mother Hypertension mother Heart Disease grandfather mothers side Cancer sister  Social History  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 never smoker Drug/Alcohol Rehab (Currently) no Exercise Exercises daily; does running / walking Advance Directives Living Will, Healthcare POA  Medication History  Isometheptene-APAP-Dichloral (Oral) Specific dose unknown - Active. Simvastatin ('20MG'$  Tablet, Oral) Active. PriLOSEC ('20MG'$  Capsule DR, Oral) Active. Nitrostat (0.'4MG'$  Tab Sublingual, Sublingual) Active. Vitamin  D (1000UNIT Tablet, 1 (one) Oral) Active. Coumadin ('5MG'$  Tablet, Oral) Active.  Terazosin HCl ('5MG'$  Capsule, Oral) Active. Finasteride ('5MG'$  Tablet, Oral) Active. Diazepam ('5MG'$  Tablet, Oral) Active. Ocuvite Adult 50+ (Oral) Active. Amiodarone HCl ('200MG'$  Tablet, Oral) Active.  Past Surgical History Lung Surgery  Date: 08/28/2008. removed right middle lobe for cancer Inguinal Hernia Repair Date: 02/1969. open: left Total Knee Replacement Date: 10/21/2009. right Vasectomy Arthroscopy of Knee Date: 08/03/2008. right Foot Surgery Date: 07/1987. left bunion Cataract Surgery bilateral; Right - 05/29/2009, Left - 05/01/2009   Review of Systems General Not Present- Chills, Fatigue, Fever, Memory Loss, Night Sweats, Weight Gain and Weight Loss. Skin Not Present- Eczema, Hives, Itching, Lesions and Rash. HEENT Present- Headache. Not Present- Dentures, Double Vision, Hearing Loss, Tinnitus and Visual Loss. Respiratory Not Present- Allergies, Chronic Cough, Coughing up blood, Shortness of breath at rest and Shortness of breath with exertion. Cardiovascular Not Present- Chest Pain, Difficulty Breathing Lying Down, Murmur, Palpitations, Racing/skipping heartbeats and Swelling. Gastrointestinal Present- Constipation. Not Present- Abdominal Pain, Bloody Stool, Diarrhea, Difficulty Swallowing, Heartburn, Jaundice, Loss of appetitie, Nausea and Vomiting. Male Genitourinary Present- Blood in Urine (small amount), Urinary frequency and Urinating at Night. Not Present- Discharge, Flank Pain, Incontinence, Painful Urination, Urgency, Urinary Retention and Weak urinary stream. Musculoskeletal Present- Joint Pain. Not Present- Back Pain, Joint Swelling, Morning Stiffness, Muscle Pain, Muscle Weakness and Spasms. Neurological Not Present- Blackout spells, Difficulty with balance, Dizziness, Paralysis, Tremor and Weakness. Psychiatric Not Present- Insomnia.  Vitals Weight: 210 lb Height: 70in Weight was  reported by patient. Height was reported by patient. Body Surface Area: 2.13 m Body Mass Index: 30.13 kg/m  BP: 114/64 (Sitting, Right Arm, Standard)  Physical Exam General Mental Status -Alert, cooperative and good historian. General Appearance-pleasant, Not in acute distress. Orientation-Oriented X3. Build & Nutrition-Well nourished and Well developed.  Head and Neck Head-normocephalic, atraumatic . Neck Global Assessment - supple, no bruit auscultated on the right, no bruit auscultated on the left.  Eye Vision-Wears corrective lenses. Pupil - Bilateral-Regular and Round. Motion - Bilateral-EOMI.  Chest and Lung Exam Auscultation Breath sounds - clear at anterior chest wall and clear at posterior chest wall. Adventitious sounds - No Adventitious sounds.  Cardiovascular Auscultation Rhythm - Regular rate and rhythm(with only one ectopic or extra beat (history of A.fib)). Heart Sounds - S1 WNL and S2 WNL. Murmurs & Other Heart Sounds - Auscultation of the heart reveals - No Murmurs.  Abdomen Inspection Contour - Generalized mild distention. Palpation/Percussion Tenderness - Abdomen is non-tender to palpation. Rigidity (guarding) - Abdomen is soft. Auscultation Auscultation of the abdomen reveals - Bowel sounds normal.  Male Genitourinary Note: Not done, not pertinent to present illness   Musculoskeletal Note: On exam, he is alert and oriented, in no apparent distress. His right knee looks great. There is no swelling, range 5 to 120 with no tenderness or instability. Left knee, no effusion, range 5 to 125. Marked crepitus on range of motion. Tenderness medial greater than lateral with no instability noted. Pulses sensation and motor are intact.  RADIOGRAPHS AP both knees and lateral show bone-on-bone arthritis in medial and patellofemoral compartments of the left knee. His prosthesis on the right is in excellent position with no  abnormalities.   Assessment & Plan Primary osteoarthritis of left knee (M17.12)  Started Lovenox '150MG'$ /ML, 1 (one) Syringe daily at 8 am, 4 Syringe Local Order: Take injections for 4 days (Friday 7/28 thru Sunday 7/31).  Note:Surgical Plans: Left Total Knee Replacement  Disposition: Home  PCP: Dr. Alain Marion - Patient has been seen preoperatively and felt to be  stable for surgery. Cards: Dr. Rayann Heman - Patient has been seen preoperatively and felt to be stable for surgery. "Would need Lovenox bridge off of coumadin."  Topical TXA - MI  Anesthesia Issues: None  Comments:  Patient requested a CPM for home. He will start outpatient therapy at [facility] on 10/05/2014.  Signed electronically by Ok Edwards, III PA-C

## 2014-09-24 NOTE — Interval H&P Note (Signed)
History and Physical Interval Note:  09/24/2014 8:15 AM  Bernard [name]  has presented today for surgery, with the diagnosis of left knee osteoarthritis  The various methods of treatment have been discussed with the patient and family. After consideration of risks, benefits and other options for treatment, the patient has consented to  Procedure(s): LEFT TOTAL KNEE ARTHROPLASTY (Left) as a surgical intervention .  The patient's history has been reviewed, patient examined, no change in status, stable for surgery.  I have reviewed the patient's chart and labs.  Questions were answered to the patient's satisfaction.     Gearlean Alf

## 2014-09-24 NOTE — Op Note (Signed)
Pre-operative diagnosis- Osteoarthritis  Left knee(s)  Post-operative diagnosis- Osteoarthritis Left knee(s)  Procedure-  Left  Total Knee Arthroplasty  Surgeon- Dione Plover. Shakeda Pearse, MD  Assistant- Arlee Muslim, PA-C   Anesthesia-  Spinal  EBL-* No blood loss amount entered *   Drains Hemovac  Tourniquet time-  Total Tourniquet Time Documented: Thigh (Left) - 32 minutes Total: Thigh (Left) - 32 minutes     Complications- None  Condition-PACU - hemodynamically stable.   Brief Clinical Note   Bernard [name] is a 81 y.o. year old male with end stage OA of his left knee with progressively worsening pain and dysfunction. He has constant pain, with activity and at rest and significant functional deficits with difficulties even with ADLs. He has had extensive non-op management including analgesics, injections of cortisone and viscosupplements, and home exercise program, but remains in significant pain with significant dysfunction. Radiographs show bone on bone arthritis medial and patellofemoral. He presents now for left Total Knee Arthroplasty.     Procedure in detail---   The patient is brought into the operating room and positioned supine on the operating table. After successful administration of  Spinal,   a tourniquet is placed high on the  Left thigh(s) and the lower extremity is prepped and draped in the usual sterile fashion. Time out is performed by the operating team and then the  Left lower extremity is wrapped in Esmarch, knee flexed and the tourniquet inflated to 300 mmHg.       A midline incision is made with a ten blade through the subcutaneous tissue to the level of the extensor mechanism. A fresh blade is used to make a medial parapatellar arthrotomy. Soft tissue over the proximal medial tibia is subperiosteally elevated to the joint line with a knife and into the semimembranosus bursa with a Cobb elevator. Soft tissue over the proximal lateral tibia is elevated with attention  being paid to avoiding the patellar tendon on the tibial tubercle. The patella is everted, knee flexed 90 degrees and the ACL and PCL are removed. Findings are bone on bone medial and patellofemoral with large global osteophytes.        The drill is used to create a starting hole in the distal femur and the canal is thoroughly irrigated with sterile saline to remove the fatty contents. The 5 degree Left  valgus alignment guide is placed into the femoral canal and the distal femoral cutting block is pinned to remove 10 mm off the distal femur. Resection is made with an oscillating saw.      The tibia is subluxed forward and the menisci are removed. The extramedullary alignment guide is placed referencing proximally at the medial aspect of the tibial tubercle and distally along the second metatarsal axis and tibial crest. The block is pinned to remove 22m off the more deficient medial  side. Resection is made with an oscillating saw. Size 4is the most appropriate size for the tibia and the proximal tibia is prepared with the modular drill and keel punch for that size.      The femoral sizing guide is placed and size 5 is most appropriate. Rotation is marked off the epicondylar axis and confirmed by creating a rectangular flexion gap at 90 degrees. The size 5 cutting block is pinned in this rotation and the anterior, posterior and chamfer cuts are made with the oscillating saw. The intercondylar block is then placed and that cut is made.      Trial size 4  tibial component, trial size 5 posterior stabilized femur and a 15  mm posterior stabilized rotating platform insert trial is placed. Full extension is achieved with excellent varus/valgus and anterior/posterior balance throughout full range of motion. The patella is everted and thickness measured to be 24  mm. Free hand resection is taken to 14 mm, a 38 template is placed, lug holes are drilled, trial patella is placed, and it tracks normally. Osteophytes are  removed off the posterior femur with the trial in place. All trials are removed and the cut bone surfaces prepared with pulsatile lavage. Cement is mixed and once ready for implantation, the size 4 tibial implant, size  5 posterior stabilized femoral component, and the size 38 patella are cemented in place and the patella is held with the clamp. The trial insert is placed and the knee held in full extension. The Exparel (20 ml mixed with 30 ml saline) and .25% Bupivicaine, are injected into the extensor mechanism, posterior capsule, medial and lateral gutters and subcutaneous tissues.  All extruded cement is removed and once the cement is hard the permanent 15 mm posterior stabilized rotating platform insert is placed into the tibial tray.      The wound is copiously irrigated with saline solution and the extensor mechanism closed over a hemovac drain with #1 V-loc suture. The tourniquet is released for a total tourniquet time of 32  minutes. Flexion against gravity is 140 degrees and the patella tracks normally. Subcutaneous tissue is closed with 2.0 vicryl and subcuticular with running 4.0 Monocryl. The incision is cleaned and dried and steri-strips and a bulky sterile dressing are applied. The limb is placed into a knee immobilizer and the patient is awakened and transported to recovery in stable condition.      Please note that a surgical assistant was a medical necessity for this procedure in order to perform it in a safe and expeditious manner. Surgical assistant was necessary to retract the ligaments and vital neurovascular structures to prevent injury to them and also necessary for proper positioning of the limb to allow for anatomic placement of the prosthesis.   Dione Plover Roshawna Colclasure, MD    09/24/2014, 11:30 AM

## 2014-09-24 NOTE — Progress Notes (Signed)
ANTICOAGULATION CONSULT NOTE - Initial Consult  Pharmacy Consult for Coumadin Indication: atrial fibrillation  No Known Allergies  Patient Measurements: Height: '5\' 10"'$  (177.8 cm) Weight: 212 lb (96.163 kg) IBW/kg (Calculated) : 73  Vital Signs: Temp: 97.7 F (36.5 C) (08/01 1313) Temp Source: Oral (08/01 1313) BP: 120/69 mmHg (08/01 1313) Pulse Rate: 52 (08/01 1313)  Labs:  Recent Labs  09/24/14 0825  LABPROT 14.5  INR 1.11    Estimated Creatinine Clearance: 69.7 mL/min (by C-G formula based on Cr of 1).   Medical History: Past Medical History  Diagnosis Date  . History of BPH     benign prostatic hypertrophy  . Paroxysmal atrial fibrillation     postoperative after lung surgery  . Osteoarthrosis, unspecified whether generalized or localized, lower leg     knee  . Labyrinthitis, unspecified   . Esophageal reflux   . Cerebrovascular disease     s/p prior stroke  . Unspecified essential hypertension   . Other and unspecified hyperlipidemia   . Depressive disorder, not elsewhere classified   . Dysrhythmia     history Atrial Fib, converted with Amiodarone use- no longer taking.  [name] Heart murmur   . Cancer     lung ca dx'd 2010 - Dr. Earlie Server following.  . Malignant neoplasm middle lobe, bronchus or lung     right lung. Dx 07/27/08, sp/p resection 7/10.   [name] Headache     migraines occ., none in months.  . Renal cyst, right     being followed  . CAD (coronary artery disease)     s/p MI 1976, normal myoview 7/11  . Stroke     TIA March 2000     no side effects now    Medications:  Prescriptions prior to admission  Medication Sig Dispense Refill Last Dose  . cholecalciferol (VITAMIN D) 1000 UNITS tablet Take 1,000 Units by mouth daily.     09/18/2014  . diazepam (VALIUM) 5 MG tablet Take 1 tablet (5 mg total) by mouth 2 (two) times daily. 180 tablet 1 09/23/2014 at 2230  . enoxaparin (LOVENOX) 150 MG/ML injection Inject 150 mg into the skin as directed.    09/23/2014 at 0800  . finasteride (PROSCAR) 5 MG tablet Take 1 tablet (5 mg total) by mouth daily. 90 tablet 3 09/23/2014 at 1900  . multivitamin-lutein (OCUVITE-LUTEIN) CAPS Take 1 capsule by mouth daily.   09/18/2014  . nitroGLYCERIN (NITROSTAT) 0.4 MG SL tablet Place 1 tablet (0.4 mg total) under the tongue every 5 (five) minutes as needed. 25 tablet 3 years ago  . omeprazole (PRILOSEC) 20 MG capsule Take 1 capsule (20 mg total) by mouth daily. Every other day  (Patient taking differently: Take 20 mg by mouth daily as needed (heartburn). Every other day ) 90 capsule 1 Past Month at Unknown time  . simvastatin (ZOCOR) 20 MG tablet Take 1 tablet (20 mg total) by mouth daily. 90 tablet 3 09/23/2014 at 1900  . terazosin (HYTRIN) 5 MG capsule Take 1 capsule (5 mg total) by mouth once. (Patient taking differently: Take 5 mg by mouth every other day. ) 90 capsule 3 09/23/2014 at 1900  . warfarin (COUMADIN) 5 MG tablet Take as directed by anticoagulation clinic (Patient taking differently: Take 2.5-5 mg by mouth daily. '5mg'$  everyday except Tues, Thurs, and Sun take 2.'5mg'$ ) 90 tablet 3 09/18/2014  . APAP-Isometheptene-Dichloral 325-65-100 MG per capsule Take 1 capsule by mouth 4 (four) times daily as needed. 30 capsule 3 More than  a month at Unknown time    Assessment: 81 yo M with Afib and hx CVA on chronic Coumadin is s/p left total knee arthroplasty today.  His home Coumadin regimen is '5mg'$  daily except 2.'5mg'$  TThSun.  This was stopped after 7/26 dose and he has been bridging with Lovenox '150mg'$  SQ daily x 4 days pre-op (LD 7/31 '@0800'$ ).  INR is at baseline on admission.  He is now post-op and Coumadin and Lovenox are being resumed.  CBC reviewed- low baseline pltc noted.   Goal of Therapy:  INR 2-3   Plan:  Lovenox '30mg'$  sq q12h until INR>1.8 Coumadin '5mg'$  po x1 today Daily INR  Oaklyn Mans, Lavonia Drafts 09/24/2014,1:30 PM

## 2014-09-24 NOTE — Anesthesia Postprocedure Evaluation (Signed)
  Anesthesia Post-op Note  Patient: Bernard [name]  Procedure(s) Performed: Procedure(s): LEFT TOTAL KNEE ARTHROPLASTY (Left)  Patient Location: PACU  Anesthesia Type: Spinal/MAC  Level of Consciousness: awake and alert   Airway and Oxygen Therapy: Patient Spontanous Breathing  Post-op Pain: Controlled  Post-op Assessment: Post-op Vital signs reviewed, Patient's Cardiovascular Status Stable and Respiratory Function Stable. Block receeding.  Post-op Vital Signs: Reviewed  Filed Vitals:   09/24/14 1313  BP: 120/69  Pulse: 52  Temp: 36.5 C  Resp:     Complications: No apparent anesthesia complications

## 2014-09-24 NOTE — Transfer of Care (Signed)
Immediate Anesthesia Transfer of Care Note  Patient: Bernard [name]  Procedure(s) Performed: Procedure(s): LEFT TOTAL KNEE ARTHROPLASTY (Left)  Patient Location: PACU  Anesthesia Type:MAC and Spinal  Level of Consciousness: awake, alert , oriented and patient cooperative  Airway & Oxygen Therapy: Patient Spontanous Breathing and Patient connected to face mask oxygen  Post-op Assessment: Report given to RN, Post -op Vital signs reviewed and stable and Patient moving all extremities  Post vital signs: Reviewed and stable  Last Vitals:  Filed Vitals:   09/24/14 0729  BP: 124/69  Pulse: 75  Temp: 36.7 C  Resp: 18    Complications: No apparent anesthesia complications

## 2014-09-25 ENCOUNTER — Encounter (HOSPITAL_COMMUNITY): Payer: Self-pay | Admitting: Orthopedic Surgery

## 2014-09-25 LAB — BASIC METABOLIC PANEL
Anion gap: 7 (ref 5–15)
BUN: 22 mg/dL — ABNORMAL HIGH (ref 6–20)
CO2: 27 mmol/L (ref 22–32)
Calcium: 8.4 mg/dL — ABNORMAL LOW (ref 8.9–10.3)
Chloride: 106 mmol/L (ref 101–111)
Creatinine, Ser: 0.91 mg/dL (ref 0.61–1.24)
GFR calc Af Amer: >60 mL/min (ref >60)
GFR calc non Af Amer: >60 mL/min (ref >60)
Glucose, Bld: 196 mg/dL — ABNORMAL HIGH (ref 65–99)
Potassium: 4.2 mmol/L (ref 3.5–5.1)
SODIUM: 140 mmol/L (ref 135–145)

## 2014-09-25 LAB — PROTIME-INR
INR: 1.16 (ref 0.00–1.49)
PROTHROMBIN TIME: 15 s (ref 11.6–15.2)

## 2014-09-25 LAB — CBC
HCT: 35.4 % — ABNORMAL LOW (ref 39.0–52.0)
Hemoglobin: 11.4 g/dL — ABNORMAL LOW (ref 13.0–17.0)
MCH: 30.2 pg (ref 26.0–34.0)
MCHC: 32.2 g/dL (ref 30.0–36.0)
MCV: 93.7 fL (ref 78.0–100.0)
Platelets: 118 10*3/uL — ABNORMAL LOW (ref 150–400)
RBC: 3.78 MIL/uL — AB (ref 4.22–5.81)
RDW: 14.4 % (ref 11.5–15.5)
WBC: 13.4 10*3/uL — ABNORMAL HIGH (ref 4.0–10.5)

## 2014-09-25 MED ORDER — WARFARIN SODIUM 5 MG PO TABS
5.0000 mg | ORAL_TABLET | Freq: Once | ORAL | Status: AC
  Administered 2014-09-25: 5 mg via ORAL
  Filled 2014-09-25: qty 1

## 2014-09-25 MED ORDER — WARFARIN SODIUM 5 MG PO TABS: ORAL_TABLET | ORAL | Status: DC

## 2014-09-25 MED ORDER — OMEPRAZOLE 20 MG PO CPDR
20.0000 mg | DELAYED_RELEASE_CAPSULE | Freq: Every day | ORAL | Status: DC
  Administered 2014-09-26: 20 mg via ORAL
  Filled 2014-09-25 (×2): qty 1

## 2014-09-25 MED ORDER — TRAMADOL HCL 50 MG PO TABS: 50.0000 mg | ORAL_TABLET | Freq: Four times a day (QID) | ORAL | Status: DC | PRN

## 2014-09-25 MED ORDER — METHOCARBAMOL 500 MG PO TABS: 500.0000 mg | ORAL_TABLET | Freq: Four times a day (QID) | ORAL | Status: DC | PRN

## 2014-09-25 MED ORDER — ENOXAPARIN SODIUM 30 MG/0.3ML ~~LOC~~ SOLN: 30.0000 mg | Freq: Two times a day (BID) | SUBCUTANEOUS | Status: DC

## 2014-09-25 MED ORDER — SODIUM CHLORIDE 0.9 % IV BOLUS (SEPSIS)
250.0000 mL | Freq: Once | INTRAVENOUS | Status: AC
  Administered 2014-09-25: 250 mL via INTRAVENOUS

## 2014-09-25 MED ORDER — NON FORMULARY: 20.0000 mg | Status: DC

## 2014-09-25 MED ORDER — SODIUM CHLORIDE 0.9 % IV BOLUS (SEPSIS)
250.0000 mL | Freq: Once | INTRAVENOUS | Status: AC
Start: 2014-09-25 — End: 2014-09-25
  Administered 2014-09-25: 250 mL via INTRAVENOUS

## 2014-09-25 MED ORDER — OXYCODONE HCL 5 MG PO TABS: 5.0000 mg | ORAL_TABLET | ORAL | Status: DC | PRN

## 2014-09-25 NOTE — Discharge Instructions (Signed)
° °Dr. Frank [name] °Total Joint Specialist °[facility] °[address] °[place], [place] [address] °[phone] ° °TOTAL KNEE REPLACEMENT POSTOPERATIVE DIRECTIONS ° °Knee Rehabilitation, Guidelines Following Surgery  °Results after knee surgery are often greatly improved when you follow the exercise, range of motion and muscle strengthening exercises prescribed by your doctor. Safety measures are also important to protect the knee from further injury. Any time any of these exercises cause you to have increased pain or swelling in your knee joint, decrease the amount until you are comfortable again and slowly increase them. If you have problems or questions, call your caregiver or physical therapist for advice.  ° °HOME CARE INSTRUCTIONS  °Remove items at home which could result in a fall. This includes throw rugs or furniture in walking pathways.  °· ICE to the affected knee every three hours for 30 minutes at a time and then as needed for pain and swelling.  Continue to use ice on the knee for pain and swelling from surgery. You may notice swelling that will progress down to the foot and ankle.  This is normal after surgery.  Elevate the leg when you are not up walking on it.   °· Continue to use the breathing machine which will help keep your temperature down.  It is common for your temperature to cycle up and down following surgery, especially at night when you are not up moving around and exerting yourself.  The breathing machine keeps your lungs expanded and your temperature down. °· Do not place pillow under knee, focus on keeping the knee straight while resting ° °DIET °You may resume your previous home diet once your are discharged from the hospital. ° °DRESSING / WOUND CARE / SHOWERING °You may shower 3 days after surgery, but keep the wounds dry during showering.  You may use an occlusive plastic wrap (Press'n Seal for example), NO SOAKING/SUBMERGING IN THE BATHTUB.  If the  bandage gets wet, change with a clean dry gauze.  If the incision gets wet, pat the wound dry with a clean towel. °You may start showering once you are discharged home but do not submerge the incision under water. Just pat the incision dry and apply a dry gauze dressing on daily. °Change the surgical dressing daily and reapply a dry dressing each time. ° °ACTIVITY °Walk with your walker as instructed. °Use walker as long as suggested by your caregivers. °Avoid periods of inactivity such as sitting longer than an hour when not asleep. This helps prevent blood clots.  °You may resume a sexual relationship in one month or when given the OK by your doctor.  °You may return to work once you are cleared by your doctor.  °Do not drive a car for 6 weeks or until released by you surgeon.  °Do not drive while taking narcotics. ° °WEIGHT BEARING °Weight bearing as tolerated with assist device (walker, cane, etc) as directed, use it as long as suggested by your surgeon or therapist, typically at least 4-6 weeks. ° °POSTOPERATIVE CONSTIPATION PROTOCOL °Constipation - defined medically as fewer than three stools per week and severe constipation as less than one stool per week. ° °One of the most common issues patients have following surgery is constipation.  Even if you have a regular bowel pattern at home, your normal regimen is likely to be disrupted due to multiple reasons following surgery.  Combination of anesthesia, postoperative narcotics, change in appetite and fluid intake all can affect your bowels.    In order to avoid complications following surgery, here are some recommendations in order to help you during your recovery period. ° °Colace (docusate) - Pick up an over-the-counter form of Colace or another stool softener and take twice a day as long as you are requiring postoperative pain medications.  Take with a full glass of water daily.  If you experience loose stools or diarrhea, hold the colace until you stool forms  back up.  If your symptoms do not get better within 1 week or if they get worse, check with your doctor. ° °Dulcolax (bisacodyl) - Pick up over-the-counter and take as directed by the product packaging as needed to assist with the movement of your bowels.  Take with a full glass of water.  Use this product as needed if not relieved by Colace only.  ° °MiraLax (polyethylene glycol) - Pick up over-the-counter to have on hand.  MiraLax is a solution that will increase the amount of water in your bowels to assist with bowel movements.  Take as directed and can mix with a glass of water, juice, soda, coffee, or tea.  Take if you go more than two days without a movement. °Do not use MiraLax more than once per day. Call your doctor if you are still constipated or irregular after using this medication for 7 days in a row. ° °If you continue to have problems with postoperative constipation, please contact the office for further assistance and recommendations.  If you experience "the worst abdominal pain ever" or develop nausea or vomiting, please contact the office immediatly for further recommendations for treatment. ° °ITCHING ° If you experience itching with your medications, try taking only a single pain pill, or even half a pain pill at a time.  You can also use Benadryl over the counter for itching or also to help with sleep.  ° °TED HOSE STOCKINGS °Wear the elastic stockings on both legs for three weeks following surgery during the day but you may remove then at night for sleeping. ° °MEDICATIONS °See your medication summary on the “After Visit Summary” that the nursing staff will review with you prior to discharge.  You may have some home medications which will be placed on hold until you complete the course of blood thinner medication.  It is important for you to complete the blood thinner medication as prescribed by your surgeon.  Continue your approved medications as instructed at time of  discharge. ° °PRECAUTIONS °If you experience chest pain or shortness of breath - call 911 immediately for transfer to the hospital emergency department.  °If you develop a fever greater that 101 F, purulent drainage from wound, increased redness or drainage from wound, foul odor from the wound/dressing, or calf pain - CONTACT YOUR SURGEON.   °                                                °FOLLOW-UP APPOINTMENTS °Make sure you keep all of your appointments after your operation with your surgeon and caregivers. You should call the office at the above phone number and make an appointment for approximately two weeks after the date of your surgery or on the date instructed by your surgeon outlined in the "After Visit Summary". ° ° °RANGE OF MOTION AND STRENGTHENING EXERCISES  °Rehabilitation of the knee is important following a knee injury or   an operation. After just a few days of immobilization, the muscles of the thigh which control the knee become weakened and shrink (atrophy). Knee exercises are designed to build up the tone and strength of the thigh muscles and to improve knee motion. Often times heat used for twenty to thirty minutes before working out will loosen up your tissues and help with improving the range of motion but do not use heat for the first two weeks following surgery. These exercises can be done on a training (exercise) mat, on the floor, on a table or on a bed. Use what ever works the best and is most comfortable for you Knee exercises include:  Leg Lifts - While your knee is still immobilized in a splint or cast, you can do straight leg raises. Lift the leg to 60 degrees, hold for 3 sec, and slowly lower the leg. Repeat 10-20 times 2-3 times daily. Perform this exercise against resistance later as your knee gets better.  Quad and Hamstring Sets - Tighten up the muscle on the front of the thigh (Quad) and hold for 5-10 sec. Repeat this 10-20 times hourly. Hamstring sets are done by pushing the  foot backward against an object and holding for 5-10 sec. Repeat as with quad sets.   Leg Slides: Lying on your back, slowly slide your foot toward your buttocks, bending your knee up off the floor (only go as far as is comfortable). Then slowly slide your foot back down until your leg is flat on the floor again.  Angel Wings: Lying on your back spread your legs to the side as far apart as you can without causing discomfort.  A rehabilitation program following serious knee injuries can speed recovery and prevent re-injury in the future due to weakened muscles. Contact your doctor or a physical therapist for more information on knee rehabilitation.   IF YOU ARE TRANSFERRED TO A SKILLED REHAB FACILITY If the patient is transferred to a skilled rehab facility following release from the hospital, a list of the current medications will be sent to the facility for the patient to continue.  When discharged from the skilled rehab facility, please have the facility set up the patient's [place] prior to being released. Also, the skilled facility will be responsible for providing the patient with their medications at time of release from the facility to include their pain medication, the muscle relaxants, and their blood thinner medication. If the patient is still at the rehab facility at time of the two week follow up appointment, the skilled rehab facility will also need to assist the patient in arranging follow up appointment in our office and any transportation needs.  MAKE SURE YOU:  Understand these instructions.  Get help right away if you are not doing well or get worse.    Pick up stool softner and laxative for home use following surgery while on pain medications. Do not submerge incision under water. Please use good hand washing techniques while changing dressing each day. May shower starting three days after surgery. Please use a clean towel to pat the incision dry following  showers. Continue to use ice for pain and swelling after surgery. Do not use any lotions or creams on the incision until instructed by your surgeon.  Take Coumadin for three weeks for postoperative protocol and then the patient may resume their previous Coumadin home regimen.  The dose may need to be adjusted based upon the INR.  Please follow the INR  and titrate Coumadin dose for a therapeutic range between 2.0 and 3.0 INR.  After completing the three weeks of Coumadin, the patient may resume their previous Coumadin home regimen.  Continue Lovenox injections until the INR is therapeutic at or greater than 2.0.  When INR reaches the therapeutic level of equal to or greater than 2.0, the patient may discontinue the Lovenox injections.

## 2014-09-25 NOTE — Progress Notes (Signed)
OT Cancellation Note  Patient Details Name: DWYANE DUPREE MRN: [id number] DOB: Sep 18, 1932   Cancelled Treatment:    Reason Eval/Treat Not Completed: OT screened, no needs identified, will sign off  Darlina Rumpf [place], OTR/L [phone]  09/25/2014, 1:02 PM

## 2014-09-25 NOTE — Evaluation (Signed)
Physical Therapy Evaluation Patient Details Name: Bernard [name] MRN: [id number] DOB: Jul 02, 1932 Today's Date: 09/25/2014   History of Present Illness  81 yo male s/p L TKA 09/24/14. Hx of R TKA 2011  Clinical Impression  On eval, pt required Min assist for mobility-able to ambulate ~80 feet with RW. Pain rated 5/10.     Follow Up Recommendations Home health PT;Supervision/Assistance - 24 hour    Equipment Recommendations  None recommended by PT    Recommendations for Other Services       Precautions / Restrictions Precautions Precautions: Knee;Fall Required Braces or Orthoses: Knee Immobilizer - Left Knee Immobilizer - Left: Discontinue once straight leg raise with < 10 degree lag Restrictions Weight Bearing Restrictions: No LLE Weight Bearing: Weight bearing as tolerated      Mobility  Bed Mobility Overal bed mobility: Needs Assistance Bed Mobility: Supine to Sit     Supine to sit: Min assist     General bed mobility comments: Assist for L LE. Increased time.   Transfers Overall transfer level: Needs assistance Equipment used: Rolling walker (2 wheeled) Transfers: Sit to/from Stand Sit to Stand: Min assist         General transfer comment: Assist to rise, stabilize, control descent. VCs safety, technique, hand placement  Ambulation/Gait Ambulation/Gait assistance: Min assist Ambulation Distance (Feet): 80 Feet Assistive device: Rolling walker (2 wheeled) Gait Pattern/deviations: Step-to pattern;Trunk flexed;Antalgic     General Gait Details: Assist to stabilize. slow gait speed.   Stairs            Wheelchair Mobility    Modified Rankin (Stroke Patients Only)       Balance                                             Pertinent Vitals/Pain Pain Assessment: 0-10 Pain Score: 5  Pain Location: L knee Pain Descriptors / Indicators: Aching;Sore Pain Intervention(s): Monitored during session;Ice applied;Repositioned     Home Living Family/patient expects to be discharged to:: Private residence Living Arrangements: Spouse/significant other Available Help at Discharge: Family Type of Home: House Home Access: Stairs to enter Entrance Stairs-Rails: None Entrance Stairs-Number of Steps: 2 Home Layout: One level;Laundry or work area in [place]: Environmental consultant - 2 wheels      Prior Function Level of Independence: Independent               Hand Dominance        Extremity/Trunk Assessment   Upper Extremity Assessment: Defer to OT evaluation           Lower Extremity Assessment: LLE deficits/detail   LLE Deficits / Details: at least: hip flex 3/5, moves ankle well  Cervical / Trunk Assessment: Normal  Communication   Communication: No difficulties  Cognition Arousal/Alertness: Awake/alert Behavior During Therapy: WFL for tasks assessed/performed Overall Cognitive Status: Within Functional Limits for tasks assessed                      General Comments      Exercises Total Joint Exercises Ankle Circles/Pumps: AROM;Both;10 reps;Supine Quad Sets: AROM;Both;10 reps;Supine Heel Slides: AAROM;Left;10 reps Hip ABduction/ADduction: AAROM;Left;10 reps;Supine Straight Leg Raises: AROM;AAROM;Left;10 reps;Supine Goniometric ROM: ~10-55 degrees      Assessment/Plan    PT Assessment Patient needs continued PT services  PT Diagnosis Difficulty walking;Acute pain   PT Problem List  PT Treatment Interventions DME instruction;Gait training;Functional mobility training;Therapeutic activities;Patient/family education;Therapeutic exercise;Balance training   PT Goals (Current goals can be found in the Care Plan section) Acute Rehab PT Goals Patient Stated Goal: home. regain independence PT Goal Formulation: With patient/family Time For Goal Achievement: 10/02/14 Potential to Achieve Goals: Good    Frequency 7X/week   Barriers to discharge        Co-evaluation                End of Session Equipment Utilized During Treatment: Gait belt;Left knee immobilizer Activity Tolerance: Patient tolerated treatment well Patient left: in chair;with call bell/phone within reach           Time: 1110-1133 PT Time Calculation (min) (ACUTE ONLY): 23 min   Charges:   PT Evaluation $Initial PT Evaluation Tier I: 1 Procedure PT Treatments $Gait Training: 8-22 mins   PT G Codes:        Weston Anna, MPT Pager: [phone]

## 2014-09-25 NOTE — Progress Notes (Signed)
Physical Therapy Treatment Patient Details Name: Bernard [name] MRN: [id number] DOB: [birth year] Today's Date: 09/25/2014    History of Present Illness 81 yo male s/p L TKA 09/24/14. Hx of R TKA 2011    PT Comments    Progressing with mobility  Follow Up Recommendations  Home health PT;Supervision/Assistance - 24 hour     Equipment Recommendations  None recommended by PT    Recommendations for Other Services       Precautions / Restrictions Precautions Precautions: Knee;Fall Required Braces or Orthoses: Knee Immobilizer - Left Knee Immobilizer - Left: Discontinue once straight leg raise with < 10 degree lag Restrictions Weight Bearing Restrictions: No LLE Weight Bearing: Weight bearing as tolerated    Mobility  Bed Mobility Overal bed mobility: Needs Assistance Bed Mobility: Sit to Supine     Supine to sit: Min assist Sit to supine: Min assist   General bed mobility comments: Assist for L LE. Increased time.   Transfers Overall transfer level: Needs assistance Equipment used: Rolling walker (2 wheeled) Transfers: Sit to/from Stand Sit to Stand: Min assist         General transfer comment: Assist to rise, stabilize, control descent. VCs safety, technique, hand placement  Ambulation/Gait Ambulation/Gait assistance: Min assist Ambulation Distance (Feet): 115 Feet Assistive device: Rolling walker (2 wheeled) Gait Pattern/deviations: Step-to pattern;Antalgic     General Gait Details: Assist to stabilize intermittently. slow gait speed.    Stairs            Wheelchair Mobility    Modified Rankin (Stroke Patients Only)       Balance                                    Cognition Arousal/Alertness: Awake/alert Behavior During Therapy: WFL for tasks assessed/performed Overall Cognitive Status: Within Functional Limits for tasks assessed                      Exercises     General Comments        Pertinent  Vitals/Pain Pain Assessment: 0-10 Pain Score: 5  Pain Location: L knee Pain Descriptors / Indicators: Aching;Sore Pain Intervention(s): Monitored during session;Repositioned    Home Living Family/patient expects to be discharged to:: Private residence Living Arrangements: Spouse/significant other Available Help at Discharge: Family Type of Home: House Home Access: Stairs to enter Entrance Stairs-Rails: None Home Layout: One level;Laundry or work area in [place]: Environmental consultant - 2 wheels      Prior Function Level of Independence: Independent          PT Goals (current goals can now be found in the care plan section) Acute Rehab PT Goals Patient Stated Goal: home. regain independence PT Goal Formulation: With patient/family Time For Goal Achievement: 10/02/14 Potential to Achieve Goals: Good Progress towards PT goals: Progressing toward goals    Frequency  7X/week    PT Plan Current plan remains appropriate    Co-evaluation             End of Session Equipment Utilized During Treatment: Gait belt;Left knee immobilizer Activity Tolerance: Patient tolerated treatment well Patient left: in bed;with call bell/phone within reach;with family/visitor present     Time: 9201-0071 PT Time Calculation (min) (ACUTE ONLY): 16 min  Charges:  $Gait Training: 8-22 mins  G Codes:      Weston Anna, MPT Pager: [phone]

## 2014-09-25 NOTE — Progress Notes (Signed)
ANTICOAGULATION CONSULT NOTE - Follow-up Consult  Pharmacy Consult for Coumadin Indication: atrial fibrillation  No Known Allergies  Patient Measurements: Height: '5\' 10"'$  (177.8 cm) Weight: 212 lb (96.163 kg) IBW/kg (Calculated) : 73  Vital Signs: Temp: 97.6 F (36.4 C) (08/02 0557) Temp Source: Oral (08/02 0557) BP: 101/52 mmHg (08/02 1002) Pulse Rate: 57 (08/02 0557)  Labs:  Recent Labs  09/24/14 0825 09/25/14 0405  HGB  --  11.4*  HCT  --  35.4*  PLT  --  118*  LABPROT 14.5 15.0  INR 1.11 1.16  CREATININE  --  0.91    Estimated Creatinine Clearance: 76.6 mL/min (by C-G formula based on Cr of 0.91).   Medical History: Past Medical History  Diagnosis Date  . History of BPH     benign prostatic hypertrophy  . Paroxysmal atrial fibrillation     postoperative after lung surgery  . Osteoarthrosis, unspecified whether generalized or localized, lower leg     knee  . Labyrinthitis, unspecified   . Esophageal reflux   . Cerebrovascular disease     s/p prior stroke  . Unspecified essential hypertension   . Other and unspecified hyperlipidemia   . Depressive disorder, not elsewhere classified   . Dysrhythmia     history Atrial Fib, converted with Amiodarone use- no longer taking.  [name] Heart murmur   . Cancer     lung ca dx'd 2010 - Dr. Earlie Server following.  . Malignant neoplasm middle lobe, bronchus or lung     right lung. Dx 07/27/08, sp/p resection 7/10.   [name] Headache     migraines occ., none in months.  . Renal cyst, right     being followed  . CAD (coronary artery disease)     s/p MI 1976, normal myoview 7/11  . Stroke     TIA March 2000     no side effects now    Medications:  Prescriptions prior to admission  Medication Sig Dispense Refill Last Dose  . cholecalciferol (VITAMIN D) 1000 UNITS tablet Take 1,000 Units by mouth daily.     09/18/2014  . diazepam (VALIUM) 5 MG tablet Take 1 tablet (5 mg total) by mouth 2 (two) times daily. 180 tablet 1 09/23/2014  at 2230  . enoxaparin (LOVENOX) 150 MG/ML injection Inject 150 mg into the skin as directed.   09/23/2014 at 0800  . finasteride (PROSCAR) 5 MG tablet Take 1 tablet (5 mg total) by mouth daily. 90 tablet 3 09/23/2014 at 1900  . multivitamin-lutein (OCUVITE-LUTEIN) CAPS Take 1 capsule by mouth daily.   09/18/2014  . nitroGLYCERIN (NITROSTAT) 0.4 MG SL tablet Place 1 tablet (0.4 mg total) under the tongue every 5 (five) minutes as needed. 25 tablet 3 years ago  . omeprazole (PRILOSEC) 20 MG capsule Take 1 capsule (20 mg total) by mouth daily. Every other day  (Patient taking differently: Take 20 mg by mouth daily as needed (heartburn). Every other day ) 90 capsule 1 Past Month at Unknown time  . simvastatin (ZOCOR) 20 MG tablet Take 1 tablet (20 mg total) by mouth daily. 90 tablet 3 09/23/2014 at 1900  . terazosin (HYTRIN) 5 MG capsule Take 1 capsule (5 mg total) by mouth once. (Patient taking differently: Take 5 mg by mouth every other day. ) 90 capsule 3 09/23/2014 at 1900  . warfarin (COUMADIN) 5 MG tablet Take as directed by anticoagulation clinic (Patient taking differently: Take 2.5-5 mg by mouth daily. '5mg'$  everyday except Tues, Thurs, and Sun take  2.'5mg'$ ) 90 tablet 3 09/18/2014  . APAP-Isometheptene-Dichloral 325-65-100 MG per capsule Take 1 capsule by mouth 4 (four) times daily as needed. 30 capsule 3 More than a month at Unknown time    Assessment: 81 yo M with Afib and hx CVA on chronic Coumadin is s/p left total knee arthroplasty today.  His home Coumadin regimen is '5mg'$  daily except 2.'5mg'$  TThSun.  This was stopped after 7/26 dose and he has been bridging with Lovenox '150mg'$  SQ daily x 4 days pre-op (LD 7/31 '@0800'$ ).  INR is at baseline on admission. He is now post-op and Coumadin and Lovenox are being resumed.    8/2:  INR no change, remains subtherapeutic.  CBC reviewed- low baseline pltc noted. No issues per RN.  Received coumadin '5mg'$  x1 on 8/1.    Goal of Therapy:  INR 2-3   Plan:  Continue  Lovenox '30mg'$  sq q12h until INR>1.8 Repeat Coumadin '5mg'$  po x1 today Daily INR  Ralene Bathe, PharmD, BCPS 09/25/2014, 11:28 AM  Pager: [phone]

## 2014-09-25 NOTE — Discharge Summary (Signed)
Physician Discharge Summary   Patient ID: Bernard [name] MRN: [id number] DOB/AGE: November 15, 1932 81 y.o.  Admit date: 09/24/2014 Discharge date: 09/26/2014  Primary Diagnosis:  Osteoarthritis Left knee(s)  Admission Diagnoses:  Past Medical History  Diagnosis Date  . History of BPH     benign prostatic hypertrophy  . Paroxysmal atrial fibrillation     postoperative after lung surgery  . Osteoarthrosis, unspecified whether generalized or localized, lower leg     knee  . Labyrinthitis, unspecified   . Esophageal reflux   . Cerebrovascular disease     s/p prior stroke  . Unspecified essential hypertension   . Other and unspecified hyperlipidemia   . Depressive disorder, not elsewhere classified   . Dysrhythmia     history Atrial Fib, converted with Amiodarone use- no longer taking.  [name] Heart murmur   . Cancer     lung ca dx'd 2010 - Dr. Earlie Server following.  . Malignant neoplasm middle lobe, bronchus or lung     right lung. Dx 07/27/08, sp/p resection 7/10.   [name] Headache     migraines occ., none in months.  . Renal cyst, right     being followed  . CAD (coronary artery disease)     s/p MI 1976, normal myoview 7/11  . Stroke     TIA March 2000     no side effects now   Discharge Diagnoses:   Principal Problem:   OA (osteoarthritis) of knee  Estimated body mass index is 30.42 kg/(m^2) as calculated from the following:   Height as of this encounter: _0  (1.778 m).   Weight as of this encounter: 96.163 kg (212 lb).  Procedure:  Procedure(s) (LRB): LEFT TOTAL KNEE ARTHROPLASTY (Left)   Consults: None  HPI: Bernard [name] is a 81 y.o. year old male with end stage OA of his left knee with progressively worsening pain and dysfunction. He has constant pain, with activity and at rest and significant functional deficits with difficulties even with ADLs. He has had extensive non-op management including analgesics, injections of cortisone and viscosupplements, and home exercise  program, but remains in significant pain with significant dysfunction. Radiographs show bone on bone arthritis medial and patellofemoral. He presents now for left Total Knee Arthroplasty.   Laboratory Data: Admission on 09/24/2014  Component Date Value Ref Range Status  . Prothrombin Time 09/24/2014 14.5  11.6 - 15.2 seconds Final  . INR 09/24/2014 1.11  0.00 - 1.49 Final  . WBC 09/25/2014 13.4* 4.0 - 10.5 K/uL Final  . RBC 09/25/2014 3.78* 4.22 - 5.81 MIL/uL Final  . Hemoglobin 09/25/2014 11.4* 13.0 - 17.0 g/dL Final  . HCT 09/25/2014 35.4* 39.0 - 52.0 % Final  . MCV 09/25/2014 93.7  78.0 - 100.0 fL Final  . MCH 09/25/2014 30.2  26.0 - 34.0 pg Final  . MCHC 09/25/2014 32.2  30.0 - 36.0 g/dL Final  . RDW 09/25/2014 14.4  11.5 - 15.5 % Final  . Platelets 09/25/2014 118* 150 - 400 K/uL Final   Comment: SPECIMEN CHECKED FOR CLOTS REPEATED TO VERIFY PLATELET COUNT CONFIRMED BY SMEAR   . Sodium 09/25/2014 140  135 - 145 mmol/L Final  . Potassium 09/25/2014 4.2  3.5 - 5.1 mmol/L Final  . Chloride 09/25/2014 106  101 - 111 mmol/L Final  . CO2 09/25/2014 27  22 - 32 mmol/L Final  . Glucose, Bld 09/25/2014 196* 65 - 99 mg/dL Final  . BUN 09/25/2014 22* 6 - 20 mg/dL Final  .  Creatinine, Ser 09/25/2014 0.91  0.61 - 1.24 mg/dL Final  . Calcium 09/25/2014 8.4* 8.9 - 10.3 mg/dL Final  . GFR calc non Af Amer 09/25/2014 >60  >60 mL/min Final  . GFR calc Af Amer 09/25/2014 >60  >60 mL/min Final   Comment: (NOTE) The eGFR has been calculated using the CKD EPI equation. This calculation has not been validated in all clinical situations. eGFR's persistently <60 mL/min signify possible Chronic Kidney Disease.   . Anion gap 09/25/2014 7  5 - 15 Final  . Prothrombin Time 09/25/2014 15.0  11.6 - 15.2 seconds Final  . INR 09/25/2014 1.16  0.00 - 1.49 Final  Hospital Outpatient Visit on 09/14/2014  Component Date Value Ref Range Status  . aPTT 09/14/2014 40* 24 - 37 seconds Final   Comment:          IF BASELINE aPTT IS ELEVATED, SUGGEST PATIENT RISK ASSESSMENT BE USED TO DETERMINE APPROPRIATE ANTICOAGULANT THERAPY.   . Prothrombin Time 09/14/2014 28.1* 11.6 - 15.2 seconds Final  . INR 09/14/2014 2.68* 0.00 - 1.49 Final  . ABO/RH(D) 09/14/2014 A POS   Final  . Antibody Screen 09/14/2014 NEG   Final  . Sample Expiration 09/14/2014 09/27/2014   Final  . Color, Urine 09/14/2014 YELLOW  YELLOW Final  . APPearance 09/14/2014 CLEAR  CLEAR Final  . Specific Gravity, Urine 09/14/2014 1.026  1.005 - 1.030 Final  . pH 09/14/2014 7.5  5.0 - 8.0 Final  . Glucose, UA 09/14/2014 NEGATIVE  NEGATIVE mg/dL Final  . Hgb urine dipstick 09/14/2014 NEGATIVE  NEGATIVE Final  . Bilirubin Urine 09/14/2014 NEGATIVE  NEGATIVE Final  . Ketones, ur 09/14/2014 NEGATIVE  NEGATIVE mg/dL Final  . Protein, ur 09/14/2014 NEGATIVE  NEGATIVE mg/dL Final  . Urobilinogen, UA 09/14/2014 1.0  0.0 - 1.0 mg/dL Final  . Nitrite 09/14/2014 NEGATIVE  NEGATIVE Final  . Leukocytes, UA 09/14/2014 NEGATIVE  NEGATIVE Final   MICROSCOPIC NOT DONE ON URINES WITH NEGATIVE PROTEIN, BLOOD, LEUKOCYTES, NITRITE, OR GLUCOSE <1000 mg/dL.  [name] MRSA, PCR 09/14/2014 NEGATIVE  NEGATIVE Final  . Staphylococcus aureus 09/14/2014 NEGATIVE  NEGATIVE Final   Comment:        The Xpert SA Assay (FDA approved for NASAL specimens in patients over 29 years of age), is one component of a comprehensive surveillance program.  Test performance has been validated by [facility] for patients greater than or equal to 61 year old. It is not intended to diagnose infection nor to guide or monitor treatment.   Appointment on 09/07/2014  Component Date Value Ref Range Status  . WBC 09/07/2014 6.5  4.0 - 10.3 10e3/uL Final  . NEUT# 09/07/2014 3.6  1.5 - 6.5 10e3/uL Final  . HGB 09/07/2014 13.9  13.0 - 17.1 g/dL Final  . HCT 09/07/2014 42.2  38.4 - 49.9 % Final  . Platelets 09/07/2014 136* 140 - 400 10e3/uL Final  . MCV 09/07/2014 91.5  79.3 - 98.0 fL  Final  . MCH 09/07/2014 30.1  27.2 - 33.4 pg Final  . MCHC 09/07/2014 32.9  32.0 - 36.0 g/dL Final  . RBC 09/07/2014 4.62  4.20 - 5.82 10e6/uL Final  . RDW 09/07/2014 14.8* 11.0 - 14.6 % Final  . lymph# 09/07/2014 2.3  0.9 - 3.3 10e3/uL Final  . MONO# 09/07/2014 0.5  0.1 - 0.9 10e3/uL Final  . Eosinophils Absolute 09/07/2014 0.0  0.0 - 0.5 10e3/uL Final  . Basophils Absolute 09/07/2014 0.0  0.0 - 0.1 10e3/uL Final  . NEUT% 09/07/2014  55.9  39.0 - 75.0 % Final  . LYMPH% 09/07/2014 35.5  14.0 - 49.0 % Final  . MONO% 09/07/2014 7.4  0.0 - 14.0 % Final  . EOS% 09/07/2014 0.7  0.0 - 7.0 % Final  . BASO% 09/07/2014 0.5  0.0 - 2.0 % Final  . Sodium 09/07/2014 141  136 - 145 mEq/L Final  . Potassium 09/07/2014 4.5  3.5 - 5.1 mEq/L Final  . Chloride 09/07/2014 109  98 - 109 mEq/L Final  . CO2 09/07/2014 26  22 - 29 mEq/L Final  . Glucose 09/07/2014 92  70 - 140 mg/dl Final  . BUN 09/07/2014 16.3  7.0 - 26.0 mg/dL Final  . Creatinine 09/07/2014 1.0  0.7 - 1.3 mg/dL Final  . Total Bilirubin 09/07/2014 0.67  0.20 - 1.20 mg/dL Final  . Alkaline Phosphatase 09/07/2014 43  40 - 150 U/L Final  . AST 09/07/2014 20  5 - 34 U/L Final  . ALT 09/07/2014 15  0 - 55 U/L Final  . Total Protein 09/07/2014 6.5  6.4 - 8.3 g/dL Final  . Albumin 09/07/2014 3.6  3.5 - 5.0 g/dL Final  . Calcium 09/07/2014 9.0  8.4 - 10.4 mg/dL Final  . Anion Gap 09/07/2014 6  3 - 11 mEq/L Final  . EGFR 09/07/2014 68* >90 ml/min/1.73 m2 Final   eGFR is calculated using the CKD-EPI Creatinine Equation (2009)  Anti-coag visit on 09/05/2014  Component Date Value Ref Range Status  . INR 09/05/2014 2.9   Final  Anti-coag visit on 08/08/2014  Component Date Value Ref Range Status  . INR 08/08/2014 3.5   Final     X-Rays:Ct Chest W Contrast  09/07/2014   CLINICAL DATA:  Lung cancer diagnosed June 2010 with surgery only. Right lung surgery.  EXAM: CT CHEST WITH CONTRAST  TECHNIQUE: Multidetector CT imaging of the chest was performed  during intravenous contrast administration.  CONTRAST:  14m OMNIPAQUE IOHEXOL 300 MG/ML  SOLN  COMPARISON:  CT 10/03/2013  FINDINGS: Mediastinum/Nodes: No axillary supraclavicular lymphadenopathy. No mediastinal hilar lymphadenopathy. No pericardial fluid. Esophagus is normal.  Lungs/Pleura:  There is linear scarring at the right lung base at the resection margin. No nodularity. Small calcified nodule in the right upper lobe is unchanged on image 17. Left lung is clear.  Upper abdomen: Limited view of the liver, kidneys, pancreas are unremarkable. Normal adrenal glands. Benign cyst of the right kidney.  Musculoskeletal: No aggressive osseous lesion.  IMPRESSION: 1. No evidence lung cancer recurrence. 2. Postsurgical changes right lung.   Electronically Signed   By: SSuzy BouchardM.D.   On: 09/07/2014 14:17    EKG: Orders placed or performed in visit on 10/12/13  . EKG 12-Lead     Hospital Course: HTYREASE VANDEBERGis a 81y.o. [facility] They were brought to the operating room on 09/24/2014 and underwent Procedure(s): LEFT TOTAL KNEE ARTHROPLASTY.  Patient tolerated the procedure well and was later transferred to the recovery room and then to the orthopaedic floor for postoperative care.  They were given PO and IV analgesics for pain control following their surgery.  They were given 24 hours of postoperative antibiotics of  Anti-infectives    Start     Dose/Rate Route Frequency Ordered Stop   09/24/14 1630  ceFAZolin (ANCEF) IVPB 2 g/50 mL premix     2 g 100 mL/hr over 30 Minutes Intravenous Every 6 hours 09/24/14 1322 09/24/14 2206   09/24/14 0743  ceFAZolin (ANCEF) IVPB 2 g/50 mL premix     2 g 100 mL/hr over 30 Minutes Intravenous On call to O.R. 09/24/14 9562 09/24/14 1033     and started on DVT prophylaxis in the form of Lovenox and Coumadin.   PT and OT were ordered for total joint protocol.  Discharge planning consulted to help with postop disposition and  equipment needs.  Patient had a decent night on the evening of surgery.  They started to get up OOB with therapy on day one. Hemovac drain was pulled without difficulty.  Continued to work with therapy into day two.  Dressing was changed on day two and the incision was healing well. Patient was seen in rounds and was ready to go home.  DC home Diet - Cardiac diet Follow up - in 2 weeks Activity - WBAT Disposition - Home Condition Upon Discharge - Good D/C Meds - See DC Summary DVT Prophylaxis - Lovenox and Coumadin  Discharge Instructions    CPM    Complete by:  As directed   Continuous passive motion machine (CPM):      Use the CPM from Zero Degrees to 50 Degrees for 6 hours hours per day.      You may increase by 5 degrees per day.  You may break it up into 2 or 3 sessions per day.      Use CPM for 2 weeks or until you are told to stop.     Call MD / Call 911    Complete by:  As directed   If you experience chest pain or shortness of breath, CALL 911 and be transported to the hospital emergency room.  If you develope a fever above 101 F, pus (white drainage) or increased drainage or redness at the wound, or calf pain, call your surgeon's office.     Change dressing    Complete by:  As directed   Change dressing daily with sterile 4 x 4 inch gauze dressing and apply TED hose. Do not submerge the incision under water.     Constipation Prevention    Complete by:  As directed   Drink plenty of fluids.  Prune juice may be helpful.  You may use a stool softener, such as Colace (over the counter) 100 mg twice a day.  Use MiraLax (over the counter) for constipation as needed.     Diet - low sodium heart healthy    Complete by:  As directed      Discharge instructions    Complete by:  As directed   Pick up stool softner and laxative for home use following surgery while on pain medications. Do not submerge incision under water. Please use good hand washing techniques while changing dressing  each day. May shower starting three days after surgery. Please use a clean towel to pat the incision dry following showers. Continue to use ice for pain and swelling after surgery. Do not use any lotions or creams on the incision until instructed by your surgeon.  Postoperative Constipation Protocol  Constipation - defined medically as fewer than three stools per week and severe constipation as less than one stool per week.  One of the most common issues patients have following surgery is constipation.  Even if you have a regular bowel pattern at home, your normal regimen is likely to be disrupted due to multiple reasons following surgery.  Combination of anesthesia, postoperative narcotics, change in appetite and fluid intake all can affect your bowels.  In order to avoid complications following surgery, here are some recommendations in order to help you during your recovery period.  Colace (docusate) - Pick up an over-the-counter form of Colace or another stool softener and take twice a day as long as you are requiring postoperative pain medications.  Take with a full glass of water daily.  If you experience loose stools or diarrhea, hold the colace until you stool forms back up.  If your symptoms do not get better within 1 week or if they get worse, check with your doctor.  Dulcolax (bisacodyl) - Pick up over-the-counter and take as directed by the product packaging as needed to assist with the movement of your bowels.  Take with a full glass of water.  Use this product as needed if not relieved by Colace only.   MiraLax (polyethylene glycol) - Pick up over-the-counter to have on hand.  MiraLax is a solution that will increase the amount of water in your bowels to assist with bowel movements.  Take as directed and can mix with a glass of water, juice, soda, coffee, or tea.  Take if you go more than two days without a movement. Do not use MiraLax more than once per day. Call your doctor if you are  still constipated or irregular after using this medication for 7 days in a row.  If you continue to have problems with postoperative constipation, please contact the office for further assistance and recommendations.  If you experience "the worst abdominal pain ever" or develop nausea or vomiting, please contact the office immediatly for further recommendations for treatment.  Take Coumadin for three weeks for postoperative protocol and then the patient may resume their previous Coumadin home regimen.  The dose may need to be adjusted based upon the INR.  Please follow the INR and titrate Coumadin dose for a therapeutic range between 2.0 and 3.0 INR.  After completing the three weeks of Coumadin, the patient may resume their previous Coumadin home regimen.  Continue Lovenox injections until the INR is therapeutic at or greater than 2.0.  When INR reaches the therapeutic level of equal to or greater than 2.0, the patient may discontinue the Lovenox injections.     Do not put a pillow under the knee. Place it under the heel.    Complete by:  As directed      Do not sit on low chairs, stoools or toilet seats, as it may be difficult to get up from low surfaces    Complete by:  As directed      Driving restrictions    Complete by:  As directed   No driving until released by the physician.     Increase activity slowly as tolerated    Complete by:  As directed      Lifting restrictions    Complete by:  As directed   No lifting until released by the physician.     Patient may shower    Complete by:  As directed   You may shower without a dressing once there is no drainage.  Do not wash over the wound.  If drainage remains, do not shower until drainage stops.     TED hose    Complete by:  As directed   Use stockings (TED hose) for 3 weeks on both leg(s).  You may remove them at night for sleeping.     Weight bearing as tolerated    Complete by:  As directed   Laterality:  left  Extremity:  Lower              Medication List    STOP taking these medications        cholecalciferol 1000 UNITS tablet  Commonly known as:  VITAMIN D     enoxaparin 150 MG/ML injection  Commonly known as:  LOVENOX  Replaced by:  enoxaparin 30 MG/0.3ML injection      TAKE these medications        diazepam 5 MG tablet  Commonly known as:  VALIUM  Take 1 tablet (5 mg total) by mouth 2 (two) times daily.     enoxaparin 30 MG/0.3ML injection  Commonly known as:  LOVENOX  Inject 0.3 mLs (30 mg total) into the skin every 12 (twelve) hours. Continue Lovenox injections until the INR is therapeutic at or greater than 2.0.  When INR reaches the therapeutic level of equal to or greater than 2.0, the patient may discontinue the Lovenox injections.     finasteride 5 MG tablet  Commonly known as:  PROSCAR  Take 1 tablet (5 mg total) by mouth daily.     isometheptene-acetaminophen-dichloralphenazone 65-100-325 MG capsule  Commonly known as:  MIDRIN  Take 1 capsule by mouth 4 (four) times daily as needed.     methocarbamol 500 MG tablet  Commonly known as:  ROBAXIN  Take 1 tablet (500 mg total) by mouth every 6 (six) hours as needed for muscle spasms.     multivitamin-lutein Caps capsule  Take 1 capsule by mouth daily.     nitroGLYCERIN 0.4 MG SL tablet  Commonly known as:  NITROSTAT  Place 1 tablet (0.4 mg total) under the tongue every 5 (five) minutes as needed.     omeprazole 20 MG capsule  Commonly known as:  PRILOSEC  Take 1 capsule (20 mg total) by mouth daily. Every other day     oxyCODONE 5 MG immediate release tablet  Commonly known as:  Oxy IR/ROXICODONE  Take 1-2 tablets (5-10 mg total) by mouth every 3 (three) hours as needed for moderate pain or severe pain.     simvastatin 20 MG tablet  Commonly known as:  ZOCOR  Take 1 tablet (20 mg total) by mouth daily.     terazosin 5 MG capsule  Commonly known as:  HYTRIN  Take 1 capsule (5 mg total) by mouth once.     traMADol 50 MG  tablet  Commonly known as:  ULTRAM  Take 1-2 tablets (50-100 mg total) by mouth every 6 (six) hours as needed (mild pain).     warfarin 5 MG tablet  Commonly known as:  COUMADIN  Take Coumadin for three weeks for postoperative protocol and then the patient may resume their previous Coumadin home regimen.  The dose may need to be adjusted based upon the INR.  Please follow the INR and titrate Coumadin dose for a therapeutic range between 2.0 and 3.0 INR.  After completing the three weeks of Coumadin, the patient may resume their previous Coumadin home regimen. Take as directed by anticoagulation clinic           Follow-up Information    Follow up with [facility]   Why:  home health physical therapy and nurse to check INR for coumadin dosage   Contact information:   [place] [address] [place] [address] [phone]       Follow up with [facility].   Why:  CPM    Contact information:  [phone]      Follow up with [facility].   Why:  3n1 (over the commode seat)   Contact information:   [address] [place] [address] [phone]       Follow up with Gearlean Alf, MD. Schedule an appointment as soon as possible for a visit on 10/09/2014.   Specialty:  Orthopedic Surgery   Why:  Call office at [phone] to setup appointment with Dr. Wynelle Link on Tuesday 10/09/2014.   Contact information:   [address] [place] [address] [phone]       Signed: Arlee Muslim, PA-C Orthopaedic Surgery 09/25/2014, 10:41 PM

## 2014-09-25 NOTE — Progress Notes (Signed)
Subjective: 1 Day Post-Op Procedure(s) (LRB): LEFT TOTAL KNEE ARTHROPLASTY (Left) Patient reports pain as mild.   Patient seen in rounds with Dr. Wynelle Link. Patient is well, but has had some minor complaints of pain in the knee, requiring pain medications We will start therapy today.  Plan is to go Home after hospital stay.  Objective: Vital signs in last 24 hours: Temp:  [97.2 F (36.2 C)-98.3 F (36.8 C)] 97.6 F (36.4 C) (08/02 0557) Pulse Rate:  [51-69] 57 (08/02 0557) Resp:  [10-16] 16 (08/02 0557) BP: (100-130)/(56-69) 109/60 mmHg (08/02 0557) SpO2:  [93 %-99 %] 97 % (08/02 0557) Weight:  [96.163 kg (212 lb)] 96.163 kg (212 lb) (08/01 1313)  Intake/Output from previous day:  Intake/Output Summary (Last 24 hours) at 09/25/14 0941 Last data filed at 09/25/14 0618  Gross per 24 hour  Intake   2585 ml  Output   1710 ml  Net    875 ml    Intake/Output this shift: UOP 215 since around MN Will give fluids this morning  Labs:  Recent Labs  09/25/14 0405  HGB 11.4*    Recent Labs  09/25/14 0405  WBC 13.4*  RBC 3.78*  HCT 35.4*  PLT 118*    Recent Labs  09/25/14 0405  NA 140  K 4.2  CL 106  CO2 27  BUN 22*  CREATININE 0.91  GLUCOSE 196*  CALCIUM 8.4*    Recent Labs  09/24/14 0825 09/25/14 0405  INR 1.11 1.16    EXAM General - Patient is Alert, Appropriate and Oriented Extremity - Neurovascular intact Sensation intact distally Dorsiflexion/Plantar flexion intact Dressing - dressing C/D/I Motor Function - intact, moving foot and toes well on exam.  Hemovac pulled without difficulty.  Past Medical History  Diagnosis Date  . History of BPH     benign prostatic hypertrophy  . Paroxysmal atrial fibrillation     postoperative after lung surgery  . Osteoarthrosis, unspecified whether generalized or localized, lower leg     knee  . Labyrinthitis, unspecified   . Esophageal reflux   . Cerebrovascular disease     s/p prior stroke  .  Unspecified essential hypertension   . Other and unspecified hyperlipidemia   . Depressive disorder, not elsewhere classified   . Dysrhythmia     history Atrial Fib, converted with Amiodarone use- no longer taking.  [name] Heart murmur   . Cancer     lung ca dx'd 2010 - Dr. Earlie Server following.  . Malignant neoplasm middle lobe, bronchus or lung     right lung. Dx 07/27/08, sp/p resection 7/10.   [name] Headache     migraines occ., none in months.  . Renal cyst, right     being followed  . CAD (coronary artery disease)     s/p MI 1976, normal myoview 7/11  . Stroke     TIA March 2000     no side effects now    Assessment/Plan: 1 Day Post-Op Procedure(s) (LRB): LEFT TOTAL KNEE ARTHROPLASTY (Left) Principal Problem:   OA (osteoarthritis) of knee  Estimated body mass index is 30.42 kg/(m^2) as calculated from the following:   Height as of this encounter: '5\' 10"'$  (1.778 m).   Weight as of this encounter: 96.163 kg (212 lb). Advance diet Up with therapy Plan for discharge tomorrow Discharge home with home health  DVT Prophylaxis - Lovenox and Coumadin Weight-Bearing as tolerated to left leg D/C O2 and Pulse OX and try on Room Air Give additional  fluids and monitor I&O's  Arlee Muslim, PA-C Orthopaedic Surgery 09/25/2014, 9:41 AM

## 2014-09-25 NOTE — Care Management Note (Signed)
Case Management Note  Patient Details  Name: SZYMON FOILES MRN: [id number] Date of Birth: [birth year]  Subjective/Objective:                   LEFT TOTAL KNEE ARTHROPLASTY (Left) Action/Plan: Discharge planning  Expected Discharge Date:  09/26/14               Expected Discharge Plan:  [place]  In-House Referral:     Discharge planning Services  CM Consult  Post Acute Care Choice:  Home Health Choice offered to:  Patient  DME Arranged:  3-N-1, CPM DME Agency:  [place] [employer]  HH Arranged:  RN, PT [facility] Agency:  [place]  Status of Service:  Completed, signed off  Medicare Important Message Given:    Date Medicare IM Given:    Medicare IM give by:    Date Additional Medicare IM Given:    Additional Medicare Important Message give by:     If discussed at [place] of Stay Meetings, dates discussed:    Additional Comments: CM met with pt in room to offer choice of home health agency.  Pt chooses [facility] to render HHPT/RN (for inr check).  Address and contact information verified by pt.  Referral emailed to [employer], Tim.  Cm called [employer] and per Rep, Brent's request faxed facesheet, order, to [phone].  CM called [facility] DME rep, Lecretia to please deliver the 3n1 to the room prior to discharge tomorrow.  No other CM needs were communicated.  Dellie Catholic, RN 09/25/2014, 11:08 AM

## 2014-09-26 LAB — CBC
HCT: 34.1 % — ABNORMAL LOW (ref 39.0–52.0)
Hemoglobin: 11.2 g/dL — ABNORMAL LOW (ref 13.0–17.0)
MCH: 30.8 pg (ref 26.0–34.0)
MCHC: 32.8 g/dL (ref 30.0–36.0)
MCV: 93.7 fL (ref 78.0–100.0)
Platelets: 116 10*3/uL — ABNORMAL LOW (ref 150–400)
RBC: 3.64 MIL/uL — ABNORMAL LOW (ref 4.22–5.81)
RDW: 14.6 % (ref 11.5–15.5)
WBC: 15.1 10*3/uL — AB (ref 4.0–10.5)

## 2014-09-26 LAB — BASIC METABOLIC PANEL
Anion gap: 5 (ref 5–15)
BUN: 22 mg/dL — ABNORMAL HIGH (ref 6–20)
CO2: 29 mmol/L (ref 22–32)
Calcium: 8.7 mg/dL — ABNORMAL LOW (ref 8.9–10.3)
Chloride: 105 mmol/L (ref 101–111)
Creatinine, Ser: 0.81 mg/dL (ref 0.61–1.24)
GFR calc non Af Amer: >60 mL/min (ref >60)
Glucose, Bld: 136 mg/dL — ABNORMAL HIGH (ref 65–99)
Potassium: 4.1 mmol/L (ref 3.5–5.1)
Sodium: 139 mmol/L (ref 135–145)

## 2014-09-26 LAB — PROTIME-INR
INR: 1.41 (ref 0.00–1.49)
Prothrombin Time: 17.3 seconds — ABNORMAL HIGH (ref 11.6–15.2)

## 2014-09-26 MED ORDER — ENOXAPARIN (LOVENOX) PATIENT EDUCATION KIT
PACK | Freq: Once | Status: AC
  Administered 2014-09-26: 1
  Filled 2014-09-26: qty 1

## 2014-09-26 MED ORDER — WARFARIN SODIUM 5 MG PO TABS
5.0000 mg | ORAL_TABLET | Freq: Once | ORAL | Status: DC
  Filled 2014-09-26: qty 1

## 2014-09-26 NOTE — Care Management Important Message (Signed)
Important Message  Patient Details  Name: TRAYVON TRUMBULL MRN: [id number] Date of Birth: [birth year]   Medicare Important Message Given:  Yes-second notification given    Camillo Flaming 09/26/2014, 1:07 [place] Message  Patient Details  Name: CHET GREENLEY MRN: [id number] Date of Birth: 09-Dec-1932   Medicare Important Message Given:  Yes-second notification given    Camillo Flaming 09/26/2014, 1:06 PM

## 2014-09-26 NOTE — Progress Notes (Signed)
Physical Therapy Treatment Patient Details Name: Bernard [name] MRN: [id number] DOB: Jul 15, 1932 Today's Date: 09/26/2014    History of Present Illness 81 yo male s/p L TKA 09/24/14. Hx of R TKA 2011    PT Comments    Practiced ambulation, exercises, stair negotiation. Ready to d/c from PT standpoint.   Follow Up Recommendations  Home health PT;Supervision/Assistance - 24 hour     Equipment Recommendations  None recommended by PT    Recommendations for Other Services       Precautions / Restrictions Precautions Precautions: Knee;Fall Required Braces or Orthoses: Knee Immobilizer - Left Knee Immobilizer - Left: Discontinue once straight leg raise with < 10 degree lag Restrictions Weight Bearing Restrictions: No LLE Weight Bearing: Weight bearing as tolerated    Mobility  Bed Mobility               General bed mobility comments: pt oob in recliner  Transfers Overall transfer level: Needs assistance Equipment used: Rolling walker (2 wheeled) Transfers: Sit to/from Stand Sit to Stand: Min assist         General transfer comment: Assist to rise, stabilize, control descent. VCs safety, technique, hand placement  Ambulation/Gait Ambulation/Gait assistance: Min guard Ambulation Distance (Feet): 75 Feet Assistive device: Rolling walker (2 wheeled) Gait Pattern/deviations: Step-to pattern;Antalgic;Trunk flexed     General Gait Details: close guard for safety.  slow gait speed.    Stairs Stairs: Yes Stairs assistance: Min assist Stair Management: Step to pattern;Forwards Number of Stairs: 2 General stair comments: 1 HHA to ascend steps. Assist to stabilize/support pt. Wife present to observe. Wife and grandson will be assisting pt into home. VCs safety, technique, sequence.   Wheelchair Mobility    Modified Rankin (Stroke Patients Only)       Balance                                    Cognition Arousal/Alertness: Awake/alert Behavior  During Therapy: WFL for tasks assessed/performed Overall Cognitive Status: Within Functional Limits for tasks assessed                      Exercises Total Joint Exercises Ankle Circles/Pumps: AROM;Both;10 reps;Supine Quad Sets: AROM;Both;10 reps;Supine Heel Slides: AAROM;Left;10 reps Hip ABduction/ADduction: AAROM;Left;10 reps;Supine Straight Leg Raises: AROM;AAROM;Left;10 reps;Supine Goniometric ROM: ~10-50 degrees    General Comments        Pertinent Vitals/Pain Pain Assessment: 0-10 Pain Score: 5  Pain Location: L knee Pain Descriptors / Indicators: Aching;Sore Pain Intervention(s): Monitored during session;Repositioned;Ice applied    Home Living                      Prior Function            PT Goals (current goals can now be found in the care plan section) Progress towards PT goals: Progressing toward goals    Frequency  7X/week    PT Plan Current plan remains appropriate    Co-evaluation             End of Session Equipment Utilized During Treatment: Gait belt;Left knee immobilizer Activity Tolerance: Patient tolerated treatment well Patient left: in chair;with call bell/phone within reach;with family/visitor present     Time: 1000-1026 PT Time Calculation (min) (ACUTE ONLY): 26 min  Charges:  $Gait Training: 8-22 mins $Therapeutic Exercise: 8-22 mins  G Codes:      Weston Anna, MPT Pager: [phone]

## 2014-09-26 NOTE — Progress Notes (Signed)
Subjective: 2 Days Post-Op Procedure(s) (LRB): LEFT TOTAL KNEE ARTHROPLASTY (Left) Patient reports pain as mild.   Patient seen in rounds with Dr. Wynelle Link.  Had to take some pain meds last night. Patient is well, but has had some minor complaints of pain in the knee, requiring pain medications.  Little better this morning. Patient is ready to go home later today  Objective: Vital signs in last 24 hours: Temp:  [97.5 F (36.4 C)-98 F (36.7 C)] 97.7 F (36.5 C) (08/03 0438) Pulse Rate:  [60-95] 95 (08/03 0438) Resp:  [15-16] 16 (08/03 0438) BP: (101-145)/(52-72) 145/72 mmHg (08/03 0438) SpO2:  [94 %-96 %] 94 % (08/03 0438)  Intake/Output from previous day:  Intake/Output Summary (Last 24 hours) at 09/26/14 0746 Last data filed at 09/26/14 6962  Gross per 24 hour  Intake 3350.83 ml  Output   1185 ml  Net 2165.83 ml    Intake/Output this shift:    Labs:  Recent Labs  09/25/14 0405 09/26/14 0410  HGB 11.4* 11.2*    Recent Labs  09/25/14 0405 09/26/14 0410  WBC 13.4* 15.1*  RBC 3.78* 3.64*  HCT 35.4* 34.1*  PLT 118* 116*    Recent Labs  09/25/14 0405 09/26/14 0410  NA 140 139  K 4.2 4.1  CL 106 105  CO2 27 29  BUN 22* 22*  CREATININE 0.91 0.81  GLUCOSE 196* 136*  CALCIUM 8.4* 8.7*    Recent Labs  09/25/14 0405 09/26/14 0410  INR 1.16 1.41    EXAM: General - Patient is Alert and Appropriate Extremity - Neurovascular intact Sensation intact distally Incision - clean, dry Motor Function - intact, moving foot and toes well on exam.   Assessment/Plan: 2 Days Post-Op Procedure(s) (LRB): LEFT TOTAL KNEE ARTHROPLASTY (Left) Procedure(s) (LRB): LEFT TOTAL KNEE ARTHROPLASTY (Left) Past Medical History  Diagnosis Date  . History of BPH     benign prostatic hypertrophy  . Paroxysmal atrial fibrillation     postoperative after lung surgery  . Osteoarthrosis, unspecified whether generalized or localized, lower leg     knee  . Labyrinthitis,  unspecified   . Esophageal reflux   . Cerebrovascular disease     s/p prior stroke  . Unspecified essential hypertension   . Other and unspecified hyperlipidemia   . Depressive disorder, not elsewhere classified   . Dysrhythmia     history Atrial Fib, converted with Amiodarone use- no longer taking.  [name] Heart murmur   . Cancer     lung ca dx'd 2010 - Dr. Earlie Server following.  . Malignant neoplasm middle lobe, bronchus or lung     right lung. Dx 07/27/08, sp/p resection 7/10.   [name] Headache     migraines occ., none in months.  . Renal cyst, right     being followed  . CAD (coronary artery disease)     s/p MI 1976, normal myoview 7/11  . Stroke     TIA March 2000     no side effects now   Principal Problem:   OA (osteoarthritis) of knee  Estimated body mass index is 30.42 kg/(m^2) as calculated from the following:   Height as of this encounter: '5\' 10"'$  (1.778 m).   Weight as of this encounter: 96.163 kg (212 lb). Up with therapy  DC home Diet - Cardiac diet Follow up - in 2 weeks Activity - WBAT Disposition - Home Condition Upon Discharge - Good D/C Meds - See DC Summary DVT Prophylaxis - Lovenox and Coumadin  Arlee Muslim, PA-C Orthopaedic Surgery 09/26/2014, 7:46 AM

## 2014-09-26 NOTE — Progress Notes (Signed)
ANTICOAGULATION CONSULT NOTE - Follow-up Consult  Pharmacy Consult for Coumadin Indication: atrial fibrillation  No Known Allergies  Patient Measurements: Height: '5\' 10"'$  (177.8 cm) Weight: 212 lb (96.163 kg) IBW/kg (Calculated) : 73  Vital Signs: Temp: 97.7 F (36.5 C) (08/03 0438) Temp Source: Oral (08/03 0438) BP: 145/72 mmHg (08/03 0438) Pulse Rate: 95 (08/03 0438)  Labs:  Recent Labs  09/24/14 0825 09/25/14 0405 09/26/14 0410  HGB  --  11.4* 11.2*  HCT  --  35.4* 34.1*  PLT  --  118* 116*  LABPROT 14.5 15.0 17.3*  INR 1.11 1.16 1.41  CREATININE  --  0.91 0.81    Estimated Creatinine Clearance: 86.1 mL/min (by C-G formula based on Cr of 0.81).   Medical History: Past Medical History  Diagnosis Date  . History of BPH     benign prostatic hypertrophy  . Paroxysmal atrial fibrillation     postoperative after lung surgery  . Osteoarthrosis, unspecified whether generalized or localized, lower leg     knee  . Labyrinthitis, unspecified   . Esophageal reflux   . Cerebrovascular disease     s/p prior stroke  . Unspecified essential hypertension   . Other and unspecified hyperlipidemia   . Depressive disorder, not elsewhere classified   . Dysrhythmia     history Atrial Fib, converted with Amiodarone use- no longer taking.  [name] Heart murmur   . Cancer     lung ca dx'd 2010 - Dr. Earlie Server following.  . Malignant neoplasm middle lobe, bronchus or lung     right lung. Dx 07/27/08, sp/p resection 7/10.   [name] Headache     migraines occ., none in months.  . Renal cyst, right     being followed  . CAD (coronary artery disease)     s/p MI 1976, normal myoview 7/11  . Stroke     TIA March 2000     no side effects now    Medications:  Prescriptions prior to admission  Medication Sig Dispense Refill Last Dose  . cholecalciferol (VITAMIN D) 1000 UNITS tablet Take 1,000 Units by mouth daily.     09/18/2014  . diazepam (VALIUM) 5 MG tablet Take 1 tablet (5 mg total) by  mouth 2 (two) times daily. 180 tablet 1 09/23/2014 at 2230  . enoxaparin (LOVENOX) 150 MG/ML injection Inject 150 mg into the skin as directed.   09/23/2014 at 0800  . finasteride (PROSCAR) 5 MG tablet Take 1 tablet (5 mg total) by mouth daily. 90 tablet 3 09/23/2014 at 1900  . multivitamin-lutein (OCUVITE-LUTEIN) CAPS Take 1 capsule by mouth daily.   09/18/2014  . nitroGLYCERIN (NITROSTAT) 0.4 MG SL tablet Place 1 tablet (0.4 mg total) under the tongue every 5 (five) minutes as needed. 25 tablet 3 years ago  . omeprazole (PRILOSEC) 20 MG capsule Take 1 capsule (20 mg total) by mouth daily. Every other day  (Patient taking differently: Take 20 mg by mouth daily as needed (heartburn). Every other day ) 90 capsule 1 Past Month at Unknown time  . simvastatin (ZOCOR) 20 MG tablet Take 1 tablet (20 mg total) by mouth daily. 90 tablet 3 09/23/2014 at 1900  . terazosin (HYTRIN) 5 MG capsule Take 1 capsule (5 mg total) by mouth once. (Patient taking differently: Take 5 mg by mouth every other day. ) 90 capsule 3 09/23/2014 at 1900  . [DISCONTINUED] warfarin (COUMADIN) 5 MG tablet Take as directed by anticoagulation clinic (Patient taking differently: Take 2.5-5 mg by mouth  daily. '5mg'$  everyday except Tues, Thurs, and Sun take 2.'5mg'$ ) 90 tablet 3 09/18/2014  . APAP-Isometheptene-Dichloral 325-65-100 MG per capsule Take 1 capsule by mouth 4 (four) times daily as needed. 30 capsule 3 More than a month at Unknown time    Assessment: 81 yo M with Afib and hx CVA on chronic Coumadin is s/p left total knee arthroplasty today.  His home Coumadin regimen is '5mg'$  daily except 2.'5mg'$  TThSun.  This was stopped after 7/26 dose and he has been bridging with Lovenox '150mg'$  SQ daily x 4 days pre-op (LD 7/31 '@0800'$ ).  INR is at baseline on admission. He is now post-op and Coumadin and Lovenox are being resumed.    8/3:  INR remains subtherapeutic, however trending up.  CBC reviewed- low baseline pltc noted. Remains stable. Hgb sl  decrease from baseline but also stable. No issues per RN.  Received coumadin '5mg'$  x1 on 8/1 and 8/2. Pt eating well (100% meals charted).  No drug interactions noted.     Goal of Therapy:  INR 2-3   Plan:  Continue Lovenox '30mg'$  sq q12h until INR>1.8 Repeat Coumadin '5mg'$  po x1 today Daily PT/INR If pt discharged home today, may resume home Coumadin regimen of '5mg'$  daily except 2.'5mg'$  on T/Th/Sun with INR check tomorrow  Ralene Bathe, PharmD, BCPS 09/26/2014, 10:57 AM  Pager: [phone]

## 2014-09-26 NOTE — Progress Notes (Signed)
[facility]   [facility] is providing the following services: [employer]  If patient discharges after hours, please call [phone].   Linward Headland 09/26/2014, 10:42 AM

## 2014-09-27 ENCOUNTER — Telehealth: Payer: Self-pay | Admitting: *Deleted

## 2014-09-27 ENCOUNTER — Telehealth: Payer: Self-pay | Admitting: Internal Medicine

## 2014-09-27 NOTE — Telephone Encounter (Signed)
Please advise in PCP's and Cindy's(Coumadin Clinic) absences. Thanks!!!

## 2014-09-27 NOTE — Telephone Encounter (Signed)
Please have him continue the lovenox at this time because his INR is not yet in goal range and there would be concern it would drop below without the lovenox.

## 2014-09-27 NOTE — Telephone Encounter (Signed)
Pt was on tcm list was d/c 09/26/14 had a total knee replacement. Pt was d/c to SNF for PT will be f/u with Dr. Elmyra Ricks. Did not make appt with pcp...Bernard [name]

## 2014-09-27 NOTE — Telephone Encounter (Signed)
Left knee replacement. He is taking lobenox '30mg'$  twice a day until INR is back up to 2. One more injection for tonight. 6 more ordered, but not sure if pt needs it. Doesn't want to pay for it if he doesn't need it.  PT 20.5 INR 1.7 today 8/4 5 mg coumadin Please call Hilda Blades from [place] at [phone]

## 2014-09-28 NOTE — Telephone Encounter (Signed)
Advised debra of [facility] note---debra stated patient is very anxious about having inr checked again today, i advised that's fine to recheck inr today and advised patients inr goal range is 2.0-3.0---debra to call inr value back to our office today

## 2014-10-02 ENCOUNTER — Ambulatory Visit (INDEPENDENT_AMBULATORY_CARE_PROVIDER_SITE_OTHER): Payer: MEDICARE | Admitting: General Practice

## 2014-10-02 DIAGNOSIS — I4891 Unspecified atrial fibrillation: Secondary | ICD-10-CM

## 2014-10-02 DIAGNOSIS — Z5181 Encounter for therapeutic drug level monitoring: Secondary | ICD-10-CM

## 2014-10-02 LAB — POCT INR: INR: 2.4

## 2014-10-02 NOTE — Progress Notes (Signed)
I have reviewed and agree with the plan. 

## 2014-10-02 NOTE — Progress Notes (Signed)
Pre visit review using our clinic review tool, if applicable. No additional management support is needed unless otherwise documented below in the visit note. 

## 2014-10-09 ENCOUNTER — Other Ambulatory Visit: Payer: MEDICARE

## 2014-10-09 ENCOUNTER — Ambulatory Visit (INDEPENDENT_AMBULATORY_CARE_PROVIDER_SITE_OTHER): Payer: MEDICARE | Admitting: General Practice

## 2014-10-09 ENCOUNTER — Ambulatory Visit (HOSPITAL_COMMUNITY): Payer: MEDICARE

## 2014-10-09 DIAGNOSIS — Z5181 Encounter for therapeutic drug level monitoring: Secondary | ICD-10-CM | POA: Diagnosis not present

## 2014-10-09 DIAGNOSIS — I4891 Unspecified atrial fibrillation: Secondary | ICD-10-CM | POA: Diagnosis not present

## 2014-10-09 LAB — POCT INR: INR: 2.3

## 2014-10-09 NOTE — Progress Notes (Signed)
Pre visit review using our clinic review tool, if applicable. No additional management support is needed unless otherwise documented below in the visit note. 

## 2014-10-09 NOTE — Telephone Encounter (Signed)
-----   Message from Warden Fillers, RN sent at 10/09/2014  1:06 PM EDT ----- Could you please call patient.  I saw him this afternoon and he believes he has a UTI.  His symptoms are burning, frequency and pain.  He wants to know if Dr. Alain Marion will call in a antibiotic.  Thanks, [employer]

## 2014-10-09 NOTE — Progress Notes (Signed)
I have reviewed and agree with the plan. 

## 2014-10-10 MED ORDER — SULFAMETHOXAZOLE-TRIMETHOPRIM 400-80 MG PO TABS: 1.0000 | ORAL_TABLET | Freq: Two times a day (BID) | ORAL | Status: DC

## 2014-10-10 NOTE — Telephone Encounter (Signed)
I emailed Septra DS Rx tonight UA tomorrow  And ov w/any provider Thx

## 2014-10-11 ENCOUNTER — Ambulatory Visit: Payer: MEDICARE | Admitting: Internal Medicine

## 2014-10-11 NOTE — Telephone Encounter (Signed)
Pt informed of below. He states his Ortho obtained a UA on 10/09/14. He states he went back today for PT and the results revealed no UTI and advised him not to take the Septra that Dr. Alain Marion rx'd. He states his symptoms are better today and he will take the Septra DS if they return. Pt voiced understanding and thanked [place] for helping him.

## 2014-10-15 ENCOUNTER — Other Ambulatory Visit: Payer: Self-pay

## 2014-10-17 ENCOUNTER — Encounter: Payer: Self-pay | Admitting: Internal Medicine

## 2014-10-17 ENCOUNTER — Ambulatory Visit (INDEPENDENT_AMBULATORY_CARE_PROVIDER_SITE_OTHER): Payer: MEDICARE | Admitting: Internal Medicine

## 2014-10-17 DIAGNOSIS — I2510 Atherosclerotic heart disease of native coronary artery without angina pectoris: Secondary | ICD-10-CM

## 2014-10-17 DIAGNOSIS — R0602 Shortness of breath: Secondary | ICD-10-CM

## 2014-10-17 DIAGNOSIS — R55 Syncope and collapse: Secondary | ICD-10-CM | POA: Diagnosis not present

## 2014-10-17 LAB — CBC WITH DIFFERENTIAL/PLATELET
Basophils Absolute: 0 10*3/uL (ref 0.0–0.1)
Basophils Relative: 0.3 % (ref 0.0–3.0)
Eosinophils Absolute: 0 10*3/uL (ref 0.0–0.7)
Eosinophils Relative: 0.3 % (ref 0.0–5.0)
HCT: 36.5 % — ABNORMAL LOW (ref 39.0–52.0)
Hemoglobin: 11.8 g/dL — ABNORMAL LOW (ref 13.0–17.0)
LYMPHS ABS: 1.5 10*3/uL (ref 0.7–4.0)
Lymphocytes Relative: 16.7 % (ref 12.0–46.0)
MCHC: 32.2 g/dL (ref 30.0–36.0)
MCV: 90.6 fl (ref 78.0–100.0)
MONO ABS: 0.7 10*3/uL (ref 0.1–1.0)
Monocytes Relative: 7.5 % (ref 3.0–12.0)
NEUTROS PCT: 75.2 % (ref 43.0–77.0)
Neutro Abs: 6.8 10*3/uL (ref 1.4–7.7)
Platelets: 307 10*3/uL (ref 150.0–400.0)
RBC: 4.03 Mil/uL — AB (ref 4.22–5.81)
RDW: 16.3 % — ABNORMAL HIGH (ref 11.5–15.5)
WBC: 9 10*3/uL (ref 4.0–10.5)

## 2014-10-17 LAB — BASIC METABOLIC PANEL
BUN: 19 mg/dL (ref 6–23)
CO2: 28 mEq/L (ref 19–32)
Calcium: 9.4 mg/dL (ref 8.4–10.5)
Chloride: 104 mEq/L (ref 96–112)
Creatinine, Ser: 0.95 mg/dL (ref 0.40–1.50)
GFR: 81.13 mL/min (ref >60.00)
GLUCOSE: 109 mg/dL — AB (ref 70–99)
Potassium: 4.5 mEq/L (ref 3.5–5.1)
Sodium: 139 mEq/L (ref 135–145)

## 2014-10-17 LAB — PROTIME-INR
INR: 2.1 ratio — AB (ref 0.8–1.0)
Prothrombin Time: 22.9 s — ABNORMAL HIGH (ref 9.6–13.1)

## 2014-10-17 NOTE — Patient Instructions (Signed)
Medication Instructions:  Your physician recommends that you continue on your current medications as directed. Please refer to the Current Medication list given to you today.   Labwork: Your physician recommends that you return for lab work today: BMP/CBC/INR  Testing/Procedures: Your physician has requested that you have an echocardiogram. Echocardiography is a painless test that uses sound waves to create images of your heart. It provides your doctor with information about the size and shape of your heart and how well your heart's chambers and valves are working. This procedure takes approximately one hour. There are no restrictions for this procedure.   Your physician has requested that you have a lexiscan myoview. For further information please visit [email or web address]. Please follow instruction sheet, as given.    Follow-Up:  Your physician wants you to follow-up in: 12 months with EP PA in 12 months You will receive a reminder letter in the mail two months in advance. If you don't receive a letter, please call our office to schedule the follow-up appointment.  Remote monitoring is used to monitor your Pacemaker of ICD from home. This monitoring reduces the number of office visits required to check your device to one time per year. It allows [place] to keep an eye on the functioning of your device to ensure it is working properly. You are scheduled for a device check from home on 01/16/15. You may send your transmission at any time that day. If you have a wireless device, the transmission will be sent automatically. After your physician reviews your transmission, you will receive a postcard with your next transmission date.    Any Other Special Instructions Will Be Listed Below (If Applicable).  NO driving for 6 weeks

## 2014-10-17 NOTE — Progress Notes (Signed)
PCP: [name], MD  The patient presents today for routine electrophysiology followup.  Since last being seen in our clinic, the patient reports doing very well.  He is unaware of any further afib. No chest pain or shortness of breath.  He is recovering from knee surgery slowly.  He has had low BP.  This morning after a shower, he had transient syncope.  He denies CP but has occasional SOB with activity.  He has not noticed any bleeding.  Today, he denies symptoms of palpitations, chest pain, shortness of breath, orthopnea, PND, lower extremity edema, presyncope, syncope, or neurologic sequela.  The patient feels that he is tolerating medications without difficulties and is otherwise without complaint today.   Past Medical History  Diagnosis Date  . History of BPH     benign prostatic hypertrophy  . Paroxysmal atrial fibrillation     postoperative after lung surgery  . Osteoarthrosis, unspecified whether generalized or localized, lower leg     knee  . Labyrinthitis, unspecified   . Esophageal reflux   . Cerebrovascular disease     s/p prior stroke  . Unspecified essential hypertension   . Other and unspecified hyperlipidemia   . Depressive disorder, not elsewhere classified   . Dysrhythmia     history Atrial Fib, converted with Amiodarone use- no longer taking.  [name] Heart murmur   . Cancer     lung ca dx'd 2010 - Dr. Earlie Server following.  . Malignant neoplasm middle lobe, bronchus or lung     right lung. Dx 07/27/08, sp/p resection 7/10.   [name] Headache     migraines occ., none in months.  . Renal cyst, right     being followed  . CAD (coronary artery disease)     s/p MI 1976, normal myoview 7/11  . Stroke     TIA March 2000     no side effects now   Past Surgical History  Procedure Laterality Date  . Esophagogastroduodenoscopy  05/14/04  . Bunionectomy  '89    left foot  . Inguinal hernia repair  '71    left  . Anthroscopic surgery  '10    right knee \  . Vat-rml  lobectomy    . Right tkr  8/11  . Eye surgery      both eyes - 2011 [facility])  . Cataract extraction, bilateral    . Total knee arthroplasty Left 09/24/2014    Procedure: LEFT TOTAL KNEE ARTHROPLASTY;  Surgeon: Gaynelle Arabian, MD;  Location: [facility]S;  Service: Orthopedics;  Laterality: Left;    Current Outpatient Prescriptions  Medication Sig Dispense Refill  . APAP-Isometheptene-Dichloral 325-65-100 MG per capsule Take 1 capsule by mouth 4 (four) times daily as needed. (Patient taking differently: Take 1 capsule by mouth 4 (four) times daily as needed (headache). ) 30 capsule 3  . diazepam (VALIUM) 5 MG tablet Take 1 tablet (5 mg total) by mouth 2 (two) times daily. 180 tablet 1  . finasteride (PROSCAR) 5 MG tablet Take 1 tablet (5 mg total) by mouth daily. 90 tablet 3  . multivitamin-lutein (OCUVITE-LUTEIN) CAPS Take 1 capsule by mouth daily.    . nitroGLYCERIN (NITROSTAT) 0.4 MG SL tablet Place 1 tablet (0.4 mg total) under the tongue every 5 (five) minutes as needed. 25 tablet 3  . omeprazole (PRILOSEC) 20 MG capsule Take 20 mg by mouth daily as needed (heartburn or acid reflux).    [name] oxyCODONE (OXY IR/ROXICODONE) 5 MG immediate release tablet  Take 1-2 tablets (5-10 mg total) by mouth every 3 (three) hours as needed for moderate pain or severe pain. 80 tablet 0  . simvastatin (ZOCOR) 20 MG tablet Take 1 tablet (20 mg total) by mouth daily. 90 tablet 3  . terazosin (HYTRIN) 5 MG capsule Take 5 mg by mouth every other day.    . warfarin (COUMADIN) 5 MG tablet Take Coumadin for three weeks for postoperative protocol and then the patient may resume their previous Coumadin home regimen.  The dose may need to be adjusted based upon the INR.  Please follow the INR and titrate Coumadin dose for a therapeutic range between 2.0 and 3.0 INR.  After completing the three weeks of Coumadin, the patient may resume their previous Coumadin home regimen. Take as directed by anticoagulation clinic 90 tablet 3  .  traMADol (ULTRAM) 50 MG tablet Take 1-2 tablets (50-100 mg total) by mouth every 6 (six) hours as needed (mild pain). (Patient not taking: Reported on 10/17/2014) 60 tablet 1   No current facility-administered medications for this visit.    No Known Allergies  Social History   Social History  . Marital Status: Married    Spouse Name: N/A  . Number of Children: N/A  . Years of Education: N/A   Occupational History  . railroad Counsellor     retired   Social History Main Topics  . Smoking status: Never Smoker   . Smokeless tobacco: Never Used  . Alcohol Use: No  . Drug Use: No  . Sexual Activity:    Partners: Female   Other Topics Concern  . Not on file   Social History Narrative   HSG. Married '70. 1 son '75. Lives with wife independently in [place]. 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 [name]. 09/10/09.     Family History  Problem Relation Age of Onset  . Diabetes insipidus      2nd kin   . Coronary artery disease Neg Hx   . Colon cancer Neg Hx   . Prostate cancer Neg Hx    Physical Exam: Filed Vitals:   10/17/14 1102  BP: 100/82  Pulse: 80  Height: '5\' 10"'$  (1.778 m)  Weight: 92.534 kg (204 lb)    GEN- The patient is well appearing, alert and oriented x 3 today.   Head- normocephalic, atraumatic Eyes-  Sclera clear, conjunctiva pink Ears- hearing intact Oropharynx- clear with dry MM Neck- supple,   Lungs- Clear to ausculation bilaterally, normal work of breathing Heart- Regular rate and rhythm, no murmurs, rubs or gallops, PMI not laterally displaced GI- soft, NT, ND, + BS Extremities- no clubbing, cyanosis, or edema MS- L knee is swollen Skin- no rash or lesion Psych- euthymic mood, full affect Neuro- strength and sensation are intact  ekg today reveals sinus rhythm80 bpm, LVH, inferior infarct  Assessment and Plan:   1. afib  No symptomatic reoccurrence in several  years. On warfarin due to previous stroke.  2. HTN bp is low off of antihypertensives Check bmet, cbc today Adequate hydration encouraged  3. HL Stable No change required today  4. CAD Given SOB and syncope, will order echo and myoview  5. Syncope Likely due to hypotension posteoperatively Check bmet, cbc, and inr today Adequate hydration encouraged Echo and myoview ordered  No driving x 6 weeks  Return in one year to see EP PA

## 2014-10-18 ENCOUNTER — Telehealth: Payer: Self-pay | Admitting: Internal Medicine

## 2014-10-18 NOTE — Telephone Encounter (Addendum)
Spoke with wife and gave her the lab results.    Needs letter okay to continue PT

## 2014-10-18 NOTE — Telephone Encounter (Signed)
New Message        Pt's wife calling stating that pt can't do Physical Therapy until Dr. Rayann Heman says that he can. Needs a letter faxed to [facility]: Jerene Pitch Fax # [phone]. Also wants to know about lab results. Please call pt's wife back and advise.

## 2014-10-22 NOTE — Telephone Encounter (Signed)
Request for Physical Therapy clearance:  1. When is this Physical therepy scheduled?8/29 @ 11am 2. Name of physician performing surgery? Brook Dewitt  3. What is your office phone and fax number? [phone] [facility] Fax [phone]

## 2014-10-22 NOTE — Telephone Encounter (Signed)
Discussed with Dr Rayann Heman, okay to continue with PT.  Wife aware we are faxing now so he can go today at 11:00am

## 2014-10-22 NOTE — Telephone Encounter (Signed)
Follow up      Wife states [facility] has not received the ok for pt to take PT.  His appt is today at 11am.  They have to leave home at 10:30.  Please refax clearance to take physical therapy.  If there is a problem, please call pt soon.

## 2014-10-25 ENCOUNTER — Other Ambulatory Visit: Payer: Self-pay

## 2014-10-25 ENCOUNTER — Encounter (HOSPITAL_COMMUNITY): Payer: MEDICARE

## 2014-10-25 ENCOUNTER — Ambulatory Visit (HOSPITAL_COMMUNITY): Payer: MEDICARE | Attending: Cardiology

## 2014-10-25 DIAGNOSIS — R55 Syncope and collapse: Secondary | ICD-10-CM | POA: Insufficient documentation

## 2014-10-25 DIAGNOSIS — R0602 Shortness of breath: Secondary | ICD-10-CM | POA: Insufficient documentation

## 2014-10-25 DIAGNOSIS — I517 Cardiomegaly: Secondary | ICD-10-CM | POA: Insufficient documentation

## 2014-10-25 DIAGNOSIS — I340 Nonrheumatic mitral (valve) insufficiency: Secondary | ICD-10-CM | POA: Insufficient documentation

## 2014-11-01 ENCOUNTER — Telehealth: Payer: Self-pay | Admitting: Internal Medicine

## 2014-11-01 NOTE — Telephone Encounter (Signed)
New message      Calling for echo results

## 2014-11-01 NOTE — Telephone Encounter (Signed)
Spoke with patients wife and let him know the results of echo and that he needs to reschedule the Myoview when he feels he can.  He says he is not up to it at present

## 2014-11-06 ENCOUNTER — Ambulatory Visit: Payer: MEDICARE

## 2014-11-07 ENCOUNTER — Ambulatory Visit (INDEPENDENT_AMBULATORY_CARE_PROVIDER_SITE_OTHER): Payer: MEDICARE | Admitting: General Practice

## 2014-11-07 DIAGNOSIS — I4891 Unspecified atrial fibrillation: Secondary | ICD-10-CM | POA: Diagnosis not present

## 2014-11-07 DIAGNOSIS — Z5181 Encounter for therapeutic drug level monitoring: Secondary | ICD-10-CM

## 2014-11-07 DIAGNOSIS — G459 Transient cerebral ischemic attack, unspecified: Secondary | ICD-10-CM

## 2014-11-07 LAB — POCT INR: INR: 2.2

## 2014-11-07 NOTE — Progress Notes (Signed)
Pre visit review using our clinic review tool, if applicable. No additional management support is needed unless otherwise documented below in the visit note. 

## 2014-11-07 NOTE — Progress Notes (Signed)
I have reviewed and agree with the plan. 

## 2014-12-05 ENCOUNTER — Other Ambulatory Visit: Payer: Self-pay | Admitting: Internal Medicine

## 2014-12-06 ENCOUNTER — Ambulatory Visit: Payer: MEDICARE

## 2014-12-19 ENCOUNTER — Ambulatory Visit (INDEPENDENT_AMBULATORY_CARE_PROVIDER_SITE_OTHER): Payer: MEDICARE | Admitting: General Practice

## 2014-12-19 DIAGNOSIS — Z5181 Encounter for therapeutic drug level monitoring: Secondary | ICD-10-CM | POA: Diagnosis not present

## 2014-12-19 DIAGNOSIS — Z23 Encounter for immunization: Secondary | ICD-10-CM | POA: Diagnosis not present

## 2014-12-19 DIAGNOSIS — G459 Transient cerebral ischemic attack, unspecified: Secondary | ICD-10-CM

## 2014-12-19 DIAGNOSIS — I4891 Unspecified atrial fibrillation: Secondary | ICD-10-CM

## 2014-12-19 LAB — POCT INR: INR: 2.3

## 2014-12-19 NOTE — Progress Notes (Signed)
Pre visit review using our clinic review tool, if applicable. No additional management support is needed unless otherwise documented below in the visit note. 

## 2014-12-20 NOTE — Progress Notes (Signed)
I have reviewed and agree with the plan. 

## 2015-01-08 ENCOUNTER — Encounter: Payer: MEDICARE | Admitting: Internal Medicine

## 2015-01-30 ENCOUNTER — Ambulatory Visit (INDEPENDENT_AMBULATORY_CARE_PROVIDER_SITE_OTHER): Payer: MEDICARE | Admitting: General Practice

## 2015-01-30 DIAGNOSIS — I4891 Unspecified atrial fibrillation: Secondary | ICD-10-CM | POA: Diagnosis not present

## 2015-01-30 DIAGNOSIS — Z5181 Encounter for therapeutic drug level monitoring: Secondary | ICD-10-CM

## 2015-01-30 DIAGNOSIS — G459 Transient cerebral ischemic attack, unspecified: Secondary | ICD-10-CM

## 2015-01-30 LAB — POCT INR: INR: 2.2

## 2015-01-30 NOTE — Progress Notes (Signed)
Pre visit review using our clinic review tool, if applicable. No additional management support is needed unless otherwise documented below in the visit note. 

## 2015-01-30 NOTE — Progress Notes (Signed)
I have reviewed and agree with the plan. 

## 2015-02-01 ENCOUNTER — Encounter: Payer: Self-pay | Admitting: Internal Medicine

## 2015-02-01 ENCOUNTER — Ambulatory Visit (INDEPENDENT_AMBULATORY_CARE_PROVIDER_SITE_OTHER): Payer: MEDICARE | Admitting: Internal Medicine

## 2015-02-01 ENCOUNTER — Other Ambulatory Visit (INDEPENDENT_AMBULATORY_CARE_PROVIDER_SITE_OTHER): Payer: MEDICARE

## 2015-02-01 ENCOUNTER — Other Ambulatory Visit: Payer: Self-pay | Admitting: General Practice

## 2015-02-01 VITALS — BP 134/80 | HR 57 | Temp 98.1°F | Ht 70.0 in | Wt 205.0 lb

## 2015-02-01 DIAGNOSIS — I2510 Atherosclerotic heart disease of native coronary artery without angina pectoris: Secondary | ICD-10-CM

## 2015-02-01 DIAGNOSIS — I4891 Unspecified atrial fibrillation: Secondary | ICD-10-CM

## 2015-02-01 DIAGNOSIS — I10 Essential (primary) hypertension: Secondary | ICD-10-CM

## 2015-02-01 DIAGNOSIS — E785 Hyperlipidemia, unspecified: Secondary | ICD-10-CM

## 2015-02-01 DIAGNOSIS — R55 Syncope and collapse: Secondary | ICD-10-CM

## 2015-02-01 LAB — CBC WITH DIFFERENTIAL/PLATELET
BASOS PCT: 0.4 % (ref 0.0–3.0)
Basophils Absolute: 0 10*3/uL (ref 0.0–0.1)
EOS ABS: 0 10*3/uL (ref 0.0–0.7)
EOS PCT: 0.7 % (ref 0.0–5.0)
HEMATOCRIT: 41.3 % (ref 39.0–52.0)
Hemoglobin: 13.3 g/dL (ref 13.0–17.0)
LYMPHS PCT: 32.9 % (ref 12.0–46.0)
Lymphs Abs: 2.2 10*3/uL (ref 0.7–4.0)
MCHC: 32.1 g/dL (ref 30.0–36.0)
MCV: 84.5 fl (ref 78.0–100.0)
MONO ABS: 0.6 10*3/uL (ref 0.1–1.0)
Monocytes Relative: 8.3 % (ref 3.0–12.0)
NEUTROS ABS: 3.9 10*3/uL (ref 1.4–7.7)
Neutrophils Relative %: 57.7 % (ref 43.0–77.0)
PLATELETS: 154 10*3/uL (ref 150.0–400.0)
RBC: 4.89 Mil/uL (ref 4.22–5.81)
RDW: 16.7 % — AB (ref 11.5–15.5)
WBC: 6.7 10*3/uL (ref 4.0–10.5)

## 2015-02-01 LAB — HEPATIC FUNCTION PANEL
ALT: 11 U/L (ref 0–53)
AST: 16 U/L (ref 0–37)
Albumin: 4 g/dL (ref 3.5–5.2)
Alkaline Phosphatase: 39 U/L (ref 39–117)
BILIRUBIN TOTAL: 0.6 mg/dL (ref 0.2–1.2)
Bilirubin, Direct: 0.1 mg/dL (ref 0.0–0.3)
Total Protein: 6.9 g/dL (ref 6.0–8.3)

## 2015-02-01 LAB — BASIC METABOLIC PANEL
BUN: 17 mg/dL (ref 6–23)
CALCIUM: 9.2 mg/dL (ref 8.4–10.5)
CO2: 29 mEq/L (ref 19–32)
CREATININE: 0.94 mg/dL (ref 0.40–1.50)
Chloride: 106 mEq/L (ref 96–112)
GFR: 82.06 mL/min (ref >60.00)
Glucose, Bld: 95 mg/dL (ref 70–99)
Potassium: 4.6 mEq/L (ref 3.5–5.1)
Sodium: 141 mEq/L (ref 135–145)

## 2015-02-01 LAB — LIPID PANEL
CHOLESTEROL: 114 mg/dL (ref 0–200)
HDL: 37.4 mg/dL — ABNORMAL LOW (ref >39.00)
LDL CALC: 65 mg/dL (ref 0–99)
NonHDL: 76.58
TRIGLYCERIDES: 60 mg/dL (ref 0.0–149.0)
Total CHOL/HDL Ratio: 3
VLDL: 12 mg/dL (ref 0.0–40.0)

## 2015-02-01 LAB — TSH: TSH: 1.69 u[IU]/mL (ref 0.35–4.50)

## 2015-02-01 MED ORDER — WARFARIN SODIUM 5 MG PO TABS: ORAL_TABLET | ORAL | Status: DC

## 2015-02-01 NOTE — Assessment & Plan Note (Signed)
On Terazosin

## 2015-02-01 NOTE — Assessment & Plan Note (Signed)
Labs

## 2015-02-01 NOTE — Progress Notes (Signed)
Pre visit review using our clinic review tool, if applicable. No additional management support is needed unless otherwise documented below in the visit note. 

## 2015-02-01 NOTE — Assessment & Plan Note (Signed)
Pt passed out post-op in 8/16 - got afull (-) w/up by Dr Rayann Heman

## 2015-02-01 NOTE — Progress Notes (Signed)
Subjective:  Patient ID: Bernard [name], male    DOB: 05-15-1932  Age: 81 y.o. MRN: [id number]  CC: No chief complaint on file.   HPI Bernard [name] presents for OA (TKR 8/16), dyslipidemia, GERD, CAD  f/u. Pt passed out in 8/16 - got afull (-) w/up by Dr Bernard [name]  Outpatient Prescriptions Prior to Visit  Medication Sig Dispense Refill  . APAP-Isometheptene-Dichloral 325-65-100 MG per capsule Take 1 capsule by mouth 4 (four) times daily as needed. (Patient taking differently: Take 1 capsule by mouth 4 (four) times daily as needed (headache). ) 30 capsule 3  . diazepam (VALIUM) 5 MG tablet Take 1 tablet (5 mg total) by mouth 2 (two) times daily. 180 tablet 1  . finasteride (PROSCAR) 5 MG tablet TAKE 1 TABLET EVERY DAY 90 tablet 3  . multivitamin-lutein (OCUVITE-LUTEIN) CAPS Take 1 capsule by mouth daily.    . nitroGLYCERIN (NITROSTAT) 0.4 MG SL tablet Place 1 tablet (0.4 mg total) under the tongue every 5 (five) minutes as needed. 25 tablet 3  . omeprazole (PRILOSEC) 20 MG capsule Take 20 mg by mouth daily as needed (heartburn or acid reflux).    . simvastatin (ZOCOR) 20 MG tablet TAKE 1 TABLET EVERY DAY 90 tablet 3  . terazosin (HYTRIN) 5 MG capsule TAKE 1 CAPSULE ONE TIME DAILY 90 capsule 3  . oxyCODONE (OXY IR/ROXICODONE) 5 MG immediate release tablet Take 1-2 tablets (5-10 mg total) by mouth every 3 (three) hours as needed for moderate pain or severe pain. 80 tablet 0  . traMADol (ULTRAM) 50 MG tablet Take 1-2 tablets (50-100 mg total) by mouth every 6 (six) hours as needed (mild pain). 60 tablet 1  . warfarin (COUMADIN) 5 MG tablet Take Coumadin for three weeks for postoperative protocol and then the patient may resume their previous Coumadin home regimen.  The dose may need to be adjusted based upon the INR.  Please follow the INR and titrate Coumadin dose for a therapeutic range between 2.0 and 3.0 INR.  After completing the three weeks of Coumadin, the patient may resume their previous  Coumadin home regimen. Take as directed by anticoagulation clinic 90 tablet 3   No facility-administered medications prior to visit.    ROS Review of Systems  Constitutional: Negative for appetite change, fatigue and unexpected weight change.  HENT: Negative for congestion, nosebleeds, sneezing, sore throat and trouble swallowing.   Eyes: Negative for itching and visual disturbance.  Respiratory: Negative for cough.   Cardiovascular: Negative for chest pain, palpitations and leg swelling.  Gastrointestinal: Negative for nausea, diarrhea, blood in stool and abdominal distention.  Genitourinary: Negative for frequency and hematuria.  Musculoskeletal: Positive for arthralgias and gait problem. Negative for back pain, joint swelling and neck pain.  Skin: Negative for rash.  Neurological: Positive for syncope. Negative for dizziness, tremors, speech difficulty and weakness.  Psychiatric/Behavioral: Negative for suicidal ideas, sleep disturbance, dysphoric mood and agitation. The patient is not nervous/anxious.     Objective:  BP 134/80 mmHg  Pulse 57  Temp(Src) 98.1 F (36.7 C) (Oral)  Ht '5\' 10"'$  (1.778 m)  Wt 205 lb (92.987 kg)  BMI 29.41 kg/m2  SpO2 95%  BP Readings from Last 3 Encounters:  02/01/15 134/80  10/17/14 100/82  09/26/14 145/72    Wt Readings from Last 3 Encounters:  02/01/15 205 lb (92.987 kg)  10/17/14 204 lb (92.534 kg)  09/24/14 212 lb (96.163 kg)    Physical Exam  Constitutional: He is oriented to  person, place, and time. He appears well-developed. No distress.  NAD  HENT:  Mouth/Throat: Oropharynx is clear and moist.  Eyes: Conjunctivae are normal. Pupils are equal, round, and reactive to light.  Neck: Normal range of motion. No JVD present. No thyromegaly present.  Cardiovascular: Normal rate, regular rhythm, normal heart sounds and intact distal pulses.  Exam reveals no gallop and no friction rub.   No murmur heard. Pulmonary/Chest: Effort normal  and breath sounds normal. No respiratory distress. He has no wheezes. He has no rales. He exhibits no tenderness.  Abdominal: Soft. Bowel sounds are normal. He exhibits no distension and no mass. There is no tenderness. There is no rebound and no guarding.  Musculoskeletal: Normal range of motion. He exhibits no edema or tenderness.  Lymphadenopathy:    He has no cervical adenopathy.  Neurological: He is alert and oriented to person, place, and time. He has normal reflexes. No cranial nerve deficit. He exhibits normal muscle tone. He displays a negative Romberg sign. Coordination and gait normal.  Skin: Skin is warm and dry. No rash noted.  Psychiatric: He has a normal mood and affect. His behavior is normal. Judgment and thought content normal.    Lab Results  Component Value Date   WBC 9.0 10/17/2014   HGB 11.8* 10/17/2014   HCT 36.5* 10/17/2014   PLT 307.0 10/17/2014   GLUCOSE 109* 10/17/2014   CHOL 132 03/08/2014   TRIG 66.0 03/08/2014   HDL 44.30 03/08/2014   LDLCALC 75 03/08/2014   ALT 15 09/07/2014   AST 20 09/07/2014   NA 139 10/17/2014   K 4.5 10/17/2014   CL 104 10/17/2014   CREATININE 0.95 10/17/2014   BUN 19 10/17/2014   CO2 28 10/17/2014   TSH 2.67 04/22/2009   PSA 2.06 04/22/2009   INR 2.2 01/30/2015   HGBA1C * 10/23/2009    6.0 (NOTE)                                                                       According to the ADA Clinical Practice Recommendations for 2011, when HbA1c is used as a screening test:   >=6.5%   Diagnostic of Diabetes Mellitus           (if abnormal result  is confirmed)  5.7-6.4%   Increased risk of developing Diabetes Mellitus  References:Diagnosis and Classification of Diabetes Mellitus,Diabetes DGLO,7564,33(IRJJO 1):S62-S69 and Standards of Medical Care in         Diabetes - 2011,Diabetes Care,2011,34  (Suppl 1):S11-S61.    No results found.  Assessment & Plan:   There are no diagnoses linked to this encounter. I have discontinued  Bernard [name]'s oxyCODONE and traMADol. I am also having him maintain his multivitamin-lutein, nitroGLYCERIN, isometheptene-acetaminophen-dichloralphenazone, diazepam, omeprazole, simvastatin, terazosin, finasteride, and cholecalciferol.  Meds ordered this encounter  Medications  . cholecalciferol (VITAMIN D) 1000 UNITS tablet    Sig: Take 1,000 Units by mouth daily.     Follow-up: No Follow-up on file.  [name], MD

## 2015-02-01 NOTE — Assessment & Plan Note (Signed)
On Simvastatin 

## 2015-02-01 NOTE — Assessment & Plan Note (Signed)
On Coumadin 

## 2015-03-13 ENCOUNTER — Ambulatory Visit (INDEPENDENT_AMBULATORY_CARE_PROVIDER_SITE_OTHER): Payer: MEDICARE | Admitting: General Practice

## 2015-03-13 DIAGNOSIS — Z5181 Encounter for therapeutic drug level monitoring: Secondary | ICD-10-CM

## 2015-03-13 DIAGNOSIS — G459 Transient cerebral ischemic attack, unspecified: Secondary | ICD-10-CM

## 2015-03-13 DIAGNOSIS — I4891 Unspecified atrial fibrillation: Secondary | ICD-10-CM

## 2015-03-13 LAB — POCT INR: INR: 2.3

## 2015-03-13 NOTE — Progress Notes (Signed)
I have reviewed and agree with the plan. 

## 2015-03-13 NOTE — Progress Notes (Signed)
Pre visit review using our clinic review tool, if applicable. No additional management support is needed unless otherwise documented below in the visit note. 

## 2015-04-20 ENCOUNTER — Ambulatory Visit (INDEPENDENT_AMBULATORY_CARE_PROVIDER_SITE_OTHER): Payer: MEDICARE | Admitting: Internal Medicine

## 2015-04-20 ENCOUNTER — Telehealth: Payer: Self-pay | Admitting: Internal Medicine

## 2015-04-20 ENCOUNTER — Encounter: Payer: Self-pay | Admitting: Internal Medicine

## 2015-04-20 VITALS — BP 118/60 | HR 74 | Temp 99.2°F | Resp 18 | Ht 70.0 in | Wt 209.5 lb

## 2015-04-20 DIAGNOSIS — C3491 Malignant neoplasm of unspecified part of right bronchus or lung: Secondary | ICD-10-CM | POA: Diagnosis not present

## 2015-04-20 DIAGNOSIS — I10 Essential (primary) hypertension: Secondary | ICD-10-CM

## 2015-04-20 DIAGNOSIS — I2510 Atherosclerotic heart disease of native coronary artery without angina pectoris: Secondary | ICD-10-CM

## 2015-04-20 DIAGNOSIS — J441 Chronic obstructive pulmonary disease with (acute) exacerbation: Secondary | ICD-10-CM | POA: Diagnosis not present

## 2015-04-20 MED ORDER — AZITHROMYCIN 250 MG PO TABS
ORAL_TABLET | ORAL | Status: DC
Start: 2015-04-20 — End: 2015-05-15

## 2015-04-20 MED ORDER — GUAIFENESIN-CODEINE 100-10 MG/5ML PO SOLN: 5.0000 mL | Freq: Four times a day (QID) | ORAL | Status: DC | PRN

## 2015-04-20 NOTE — Telephone Encounter (Signed)
Patient Name: SALIF TAY DOB: September 09, 1932 Initial Comment Caller states husband has congestion, she is already on abx. Wants appt today. Nurse Assessment Nurse: Vallery Sa, RN, Cathy Date/Time (Eastern Time): 04/20/2015 8:52:45 AM Confirm and document reason for call. If symptomatic, describe symptoms. You must click the next button to save text entered. ---caller states her husband developed cold/productive cough symptoms yesterday. No severe breathing difficulty. No known fever, but feels a little warm. Alert and responsive. No wheezing. No chest pain. Has the patient traveled out of the country within the last 30 days? ---No Does the patient have any new or worsening symptoms? ---Yes Will a triage be completed? ---Yes Related visit to physician within the last 2 weeks? ---No Does the PT have any chronic conditions? (i.e. diabetes, asthma, etc.) ---Yes List chronic conditions. ---Heart attack and stroke in the past, A-Fib, Lung Cancer Is this a behavioral health or substance abuse call? ---No Guidelines Guideline Title Affirmed Question Affirmed Notes Cough - Acute Productive Wheezing is present Final Disposition User See Physician within 4 Hours (or PCP triage) Vallery Sa, RN, Cathy Comments Scheduled for appointment today at 10:30am. Referrals REFERRED TO PCP OFFICE Disagree/Comply: Comply

## 2015-04-20 NOTE — Progress Notes (Signed)
Subjective:    Patient ID: Bernard [name], male    DOB: 07/18/1932, 82 y.o.   MRN: [id number]  HPI  82 year old patient who has a history of COPD and prior resection for right lung carcinoma.  He was stable until earlier this week when he developed increasing cough, congestion with wheezing.  Today the cough which has become more productive and the wheezing has worsened.  He has had fever and chills.  He has had small amounts of brown sputum production.  Past Medical History  Diagnosis Date  . History of BPH     benign prostatic hypertrophy  . Paroxysmal atrial fibrillation (HCC)     postoperative after lung surgery  . Osteoarthrosis, unspecified whether generalized or localized, lower leg     knee  . Labyrinthitis, unspecified   . Esophageal reflux   . Cerebrovascular disease     s/p prior stroke  . Unspecified essential hypertension   . Other and unspecified hyperlipidemia   . Depressive disorder, not elsewhere classified   . Dysrhythmia     history Atrial Fib, converted with Amiodarone use- no longer taking.  [name] Heart murmur   . Cancer [facility])     lung ca dx'd 2010 - Dr. Earlie Server following.  . Malignant neoplasm middle lobe, bronchus or lung     right lung. Dx 07/27/08, sp/p resection 7/10.   [name] Headache     migraines occ., none in months.  . Renal cyst, right     being followed  . CAD (coronary artery disease)     s/p MI 1976, normal myoview 7/11  . Stroke [facility])     TIA March 2000     no side effects now    Social History   Social History  . Marital Status: Married    Spouse Name: N/A  . Number of Children: N/A  . Years of Education: N/A   Occupational History  . railroad Counsellor     retired   Social History Main Topics  . Smoking status: Never Smoker   . Smokeless tobacco: Never Used  . Alcohol Use: No  . Drug Use: No  . Sexual Activity:    Partners: Female   Other Topics Concern  . Not on file   Social History Narrative   HSG. Married '70. 1 son  '75. Lives with wife independently in [place]. 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 [name]. 09/10/09.     Past Surgical History  Procedure Laterality Date  . Esophagogastroduodenoscopy  05/14/04  . Bunionectomy  '89    left foot  . Inguinal hernia repair  '71    left  . Anthroscopic surgery  '10    right knee \  . Vat-rml lobectomy    . Right tkr  8/11  . Eye surgery      both eyes - 2011 [facility])  . Cataract extraction, bilateral    . Total knee arthroplasty Left 09/24/2014    Procedure: LEFT TOTAL KNEE ARTHROPLASTY;  Surgeon: Gaynelle Arabian, MD;  Location: [facility];  Service: Orthopedics;  Laterality: Left;    Family History  Problem Relation Age of Onset  . Diabetes insipidus      2nd kin   . Coronary artery disease Neg Hx   . Colon cancer Neg Hx   . Prostate cancer Neg Hx     No Known Allergies  Current Outpatient Prescriptions on File Prior to Visit  Medication Sig Dispense  Refill  . APAP-Isometheptene-Dichloral 325-65-100 MG per capsule Take 1 capsule by mouth 4 (four) times daily as needed. (Patient taking differently: Take 1 capsule by mouth 4 (four) times daily as needed (headache). ) 30 capsule 3  . cholecalciferol (VITAMIN D) 1000 UNITS tablet Take 1,000 Units by mouth daily.    . diazepam (VALIUM) 5 MG tablet Take 1 tablet (5 mg total) by mouth 2 (two) times daily. 180 tablet 1  . finasteride (PROSCAR) 5 MG tablet TAKE 1 TABLET EVERY DAY 90 tablet 3  . multivitamin-lutein (OCUVITE-LUTEIN) CAPS Take 1 capsule by mouth daily.    . nitroGLYCERIN (NITROSTAT) 0.4 MG SL tablet Place 1 tablet (0.4 mg total) under the tongue every 5 (five) minutes as needed. 25 tablet 3  . omeprazole (PRILOSEC) 20 MG capsule Take 20 mg by mouth daily as needed (heartburn or acid reflux).    . simvastatin (ZOCOR) 20 MG tablet TAKE 1 TABLET EVERY DAY 90 tablet 3  . terazosin (HYTRIN) 5 MG capsule TAKE 1 CAPSULE ONE TIME  DAILY 90 capsule 3  . warfarin (COUMADIN) 5 MG tablet 90 day 90 tablet 2   No current facility-administered medications on file prior to visit.    BP 118/60 mmHg  Pulse 74  Temp(Src) 99.2 F (37.3 C) (Oral)  Resp 18  Ht '5\' 10"'$  (1.778 m)  Wt 209 lb 8 oz (95.029 kg)  BMI 30.06 kg/m2  SpO2 98%     Review of Systems  Constitutional: Positive for fever, chills, activity change and appetite change. Negative for fatigue.  HENT: Negative for congestion, dental problem, ear pain, hearing loss, sore throat, tinnitus, trouble swallowing and voice change.   Eyes: Negative for pain, discharge and visual disturbance.  Respiratory: Positive for cough and wheezing. Negative for chest tightness and stridor.   Cardiovascular: Negative for chest pain, palpitations and leg swelling.  Gastrointestinal: Negative for nausea, vomiting, abdominal pain, diarrhea, constipation, blood in stool and abdominal distention.  Genitourinary: Negative for urgency, hematuria, flank pain, discharge, difficulty urinating and genital sores.  Musculoskeletal: Negative for myalgias, back pain, joint swelling, arthralgias, gait problem and neck stiffness.  Skin: Negative for rash.  Neurological: Negative for dizziness, syncope, speech difficulty, weakness, numbness and headaches.  Hematological: Negative for adenopathy. Does not bruise/bleed easily.  Psychiatric/Behavioral: Negative for behavioral problems and dysphoric mood. The patient is not nervous/anxious.        Objective:   Physical Exam  Constitutional: He is oriented to person, place, and time. He appears well-developed.  Temperature 99.2 Pulse 74, O2 saturation 98%   HENT:  Head: Normocephalic.  Right Ear: External ear normal.  Left Ear: External ear normal.  Eyes: Conjunctivae and EOM are normal.  Neck: Normal range of motion.  Cardiovascular: Normal rate and normal heart sounds.   Pulmonary/Chest:  Slight diminished breath sounds with some  scattered coarse rhonchi.  No active wheezing. No increased work of breathing  Abdominal: Bowel sounds are normal.  Musculoskeletal: Normal range of motion. He exhibits no edema or tenderness.  Neurological: He is alert and oriented to person, place, and time.  Psychiatric: He has a normal mood and affect. His behavior is normal.          Assessment & Plan:   Exacerbation of COPD.  Will force fluids and treat with mucolytic's.  Is requesting a refill of Phenergan with codeine cough medicine that has been helpful in the past.  Will treat with azithromycin. Will call if any clinical deterioration Hypertension, stable.  CAD stable

## 2015-04-20 NOTE — Patient Instructions (Addendum)
Drink as much fluid as you  can tolerate over the next few days  Take over-the-counter expectorants and cough medications such as  Mucinex DM.  Call if there is no improvement in 5 to 7 days or if  you develop worsening cough, fever, or new symptoms, such as shortness of breath or chest pain.  HOME CARE INSTRUCTIONS   Avoid exposure to all substances that irritate the airway, especially to tobacco smoke.  If you were prescribed an antibiotic medicine, finish it all even if you start to feel better.  Take all medicines as directed by your health care provider. It is important to use correct technique with inhaled medicines.  Drink enough fluids to keep your urine clear or pale yellow (unless you have a medical condition that requires fluid restriction).  Use a cool mist vaporizer. This makes it easier to clear your chest when you cough.  If you have a home nebulizer and oxygen, continue to use them as directed.  Maintain all necessary vaccinations to prevent infections.   Call or return to clinic prn if these symptoms worsen or fail to improve as anticipated.  Decrease Coumadin dosing to 1/2 tablet  Daily; check INR, this Wednesday as scheduled

## 2015-04-20 NOTE — Progress Notes (Signed)
Pre-visit discussion using our clinic review tool. No additional management support is needed unless otherwise documented below in the visit note.  

## 2015-04-24 ENCOUNTER — Ambulatory Visit (INDEPENDENT_AMBULATORY_CARE_PROVIDER_SITE_OTHER): Payer: MEDICARE | Admitting: General Practice

## 2015-04-24 DIAGNOSIS — I4891 Unspecified atrial fibrillation: Secondary | ICD-10-CM

## 2015-04-24 DIAGNOSIS — Z5181 Encounter for therapeutic drug level monitoring: Secondary | ICD-10-CM

## 2015-04-24 DIAGNOSIS — G459 Transient cerebral ischemic attack, unspecified: Secondary | ICD-10-CM

## 2015-04-24 LAB — POCT INR: INR: 1.5

## 2015-04-24 NOTE — Progress Notes (Signed)
I have reviewed and agree with the plan. 

## 2015-04-24 NOTE — Progress Notes (Signed)
Pre visit review using our clinic review tool, if applicable. No additional management support is needed unless otherwise documented below in the visit.  

## 2015-04-25 ENCOUNTER — Encounter: Payer: Self-pay | Admitting: Family Medicine

## 2015-04-25 ENCOUNTER — Ambulatory Visit (INDEPENDENT_AMBULATORY_CARE_PROVIDER_SITE_OTHER): Payer: MEDICARE | Admitting: Family Medicine

## 2015-04-25 DIAGNOSIS — I2510 Atherosclerotic heart disease of native coronary artery without angina pectoris: Secondary | ICD-10-CM | POA: Diagnosis not present

## 2015-04-25 DIAGNOSIS — R05 Cough: Secondary | ICD-10-CM | POA: Diagnosis not present

## 2015-04-25 DIAGNOSIS — R059 Cough, unspecified: Secondary | ICD-10-CM

## 2015-04-25 DIAGNOSIS — R062 Wheezing: Secondary | ICD-10-CM | POA: Diagnosis not present

## 2015-04-25 MED ORDER — ALBUTEROL SULFATE 108 (90 BASE) MCG/ACT IN AEPB: 2.0000 | INHALATION_SPRAY | RESPIRATORY_TRACT | Status: DC | PRN

## 2015-04-25 MED ORDER — METHYLPREDNISOLONE ACETATE 80 MG/ML IJ SUSP
80.0000 mg | Freq: Once | INTRAMUSCULAR | Status: AC
  Administered 2015-04-25: 80 mg via INTRAMUSCULAR

## 2015-04-25 NOTE — Progress Notes (Signed)
Subjective:    Patient ID: Bernard [name], male    DOB: 1933-01-25, 82 y.o.   MRN: [id number]  HPI  acute visit.  Patient seen with persistent cough.  He has history of right lung cancer resection back in 2010. Had follow-up CT scan last July with no signs of recurrence.  Was seen here last week and treated with Zithromax and cough medication with Phenergan with codeine. That does help his cough somewhat. No hemoptysis. No appetite or weight changes. No fever.  Has never smoked. No history of known COPD. Feels he may be wheezing some at night.  No major dyspnea with exertion. No nasal congestion.  Denies GERD symptoms.  Past Medical History  Diagnosis Date  . History of BPH     benign prostatic hypertrophy  . Paroxysmal atrial fibrillation (HCC)     postoperative after lung surgery  . Osteoarthrosis, unspecified whether generalized or localized, lower leg     knee  . Labyrinthitis, unspecified   . Esophageal reflux   . Cerebrovascular disease     s/p prior stroke  . Unspecified essential hypertension   . Other and unspecified hyperlipidemia   . Depressive disorder, not elsewhere classified   . Dysrhythmia     history Atrial Fib, converted with Amiodarone use- no longer taking.  [name] Heart murmur   . Cancer [facility])     lung ca dx'd 2010 - Dr. Earlie Server following.  . Malignant neoplasm middle lobe, bronchus or lung     right lung. Dx 07/27/08, sp/p resection 7/10.   [name] Headache     migraines occ., none in months.  . Renal cyst, right     being followed  . CAD (coronary artery disease)     s/p MI 1976, normal myoview 7/11  . Stroke [facility])     TIA March 2000     no side effects now   Past Surgical History  Procedure Laterality Date  . Esophagogastroduodenoscopy  05/14/04  . Bunionectomy  '89    left foot  . Inguinal hernia repair  '71    left  . Anthroscopic surgery  '10    right knee \  . Vat-rml lobectomy    . Right tkr  8/11  . Eye surgery      both eyes - 2011 [facility])   . Cataract extraction, bilateral    . Total knee arthroplasty Left 09/24/2014    Procedure: LEFT TOTAL KNEE ARTHROPLASTY;  Surgeon: Gaynelle Arabian, MD;  Location: [facility];  Service: Orthopedics;  Laterality: Left;    reports that he has never smoked. He has never used smokeless tobacco. He reports that he does not drink alcohol or use illicit drugs. family history is negative for Coronary artery disease, Colon cancer, and Prostate cancer. No Known Allergies    Review of Systems  Constitutional: Negative for fever, chills, appetite change and unexpected weight change.  HENT: Negative for sinus pressure.   Respiratory: Positive for cough and wheezing. Negative for shortness of breath.   Cardiovascular: Negative for chest pain.       Objective:   Physical Exam  Constitutional: He appears well-developed and well-nourished.  HENT:  Right Ear: External ear normal.  Left Ear: External ear normal.  Mouth/Throat: Oropharynx is clear and moist.  Cardiovascular: Normal rate and regular rhythm.   Pulmonary/Chest: Effort normal. No respiratory distress. He has wheezes. He has no rales.          Assessment & Plan:   Persistent cough  with wheezing. No respiratory distress. Suspect recent viral process. Depo-Medrol 80 mg IM given. Albuterol inhaler 2 puffs every 4-6 hours as needed. Follow-up with primary if not improving over the next few days and sooner for any fever or worsening symptoms.

## 2015-04-25 NOTE — Progress Notes (Signed)
Pre visit review using our clinic review tool, if applicable. No additional management support is needed unless otherwise documented below in the visit note. 

## 2015-04-25 NOTE — Patient Instructions (Signed)
Follow up for any fever or increased shortness of breath. 

## 2015-04-25 NOTE — Addendum Note (Signed)
Addended by: Elio Forget on: 04/25/2015 02:44 PM   Modules accepted: Orders

## 2015-05-15 ENCOUNTER — Ambulatory Visit (INDEPENDENT_AMBULATORY_CARE_PROVIDER_SITE_OTHER)
Admission: RE | Admit: 2015-05-15 | Discharge: 2015-05-15 | Disposition: A | Discharge status: Home / Self Care | Payer: MEDICARE | Source: Ambulatory Visit | Attending: Family Medicine | Admitting: Family Medicine

## 2015-05-15 ENCOUNTER — Ambulatory Visit (INDEPENDENT_AMBULATORY_CARE_PROVIDER_SITE_OTHER): Payer: MEDICARE | Admitting: General Practice

## 2015-05-15 ENCOUNTER — Encounter: Payer: Self-pay | Admitting: Family Medicine

## 2015-05-15 ENCOUNTER — Ambulatory Visit (INDEPENDENT_AMBULATORY_CARE_PROVIDER_SITE_OTHER): Payer: MEDICARE | Admitting: Family Medicine

## 2015-05-15 VITALS — BP 132/74 | HR 72 | Wt 203.0 lb

## 2015-05-15 DIAGNOSIS — M5010 Cervical disc disorder with radiculopathy, unspecified cervical region: Secondary | ICD-10-CM | POA: Diagnosis not present

## 2015-05-15 DIAGNOSIS — G459 Transient cerebral ischemic attack, unspecified: Secondary | ICD-10-CM

## 2015-05-15 DIAGNOSIS — Z5181 Encounter for therapeutic drug level monitoring: Secondary | ICD-10-CM | POA: Diagnosis not present

## 2015-05-15 DIAGNOSIS — I4891 Unspecified atrial fibrillation: Secondary | ICD-10-CM

## 2015-05-15 DIAGNOSIS — M542 Cervicalgia: Secondary | ICD-10-CM

## 2015-05-15 DIAGNOSIS — I2510 Atherosclerotic heart disease of native coronary artery without angina pectoris: Secondary | ICD-10-CM | POA: Diagnosis not present

## 2015-05-15 LAB — POCT INR: INR: 3.6

## 2015-05-15 MED ORDER — GABAPENTIN 100 MG PO CAPS: 200.0000 mg | ORAL_CAPSULE | Freq: Every day | ORAL | Status: DC

## 2015-05-15 NOTE — Patient Instructions (Signed)
Good to see you both  Xrays downstairs Gabapentin '100mg'$  at night first 5 nights then '200mg'$  thereafter  Ice 20 minutes 2 times daily. Usually after activity and before bed. See me again in 11-14 days to make sure you are doing better

## 2015-05-15 NOTE — Assessment & Plan Note (Signed)
I believe the patient's loss of motion of the neck as well as positive Spurling's article likelihood of the cervical spine pathology. Patient was started on gabapentin. We discussed icing. We will avoid prednisone or anti-inflammatory secondary to his Coumadin use and this moment. If worsening symptoms we may need to consider the prednisone. Patient come back and see me in 11 for 14 days to make sure that he is responding. X-rays are pending. If worsening symptoms patient knows to seek medical attention immediately. Discussed the possible need for advanced imaging as well as EMG.  Spent  25 minutes with patient face-to-face and had greater than 50% of counseling including as described above in assessment and plan.

## 2015-05-15 NOTE — Progress Notes (Signed)
Pre visit review using our clinic review tool, if applicable. No additional management support is needed unless otherwise documented below in the visit note. 

## 2015-05-15 NOTE — Progress Notes (Signed)
[name] Bernard [name], [name] Phone: [phone] Subjective:      CC: right arm pain  Bernard [name] is a 82 y.o. male coming in with complaint of right arm pain. Patient has had this pain for quite some time. Patient states his been going on for multiple weeks now. Patient states that he undergo out of town due to a death in the family. When he was sleeping in a different that he started having right arm pain. Seems to go from the elbow up to a shoulders and down to his wrist. Having some mild neck pain with it. Having weakness he states in the thumb, index and middle finger. States that there is a constant numbness. States that it can be severe overall. Not responding to over-the-counter Tylenol. Avoids anti-inflammatories secondary to him being on Coumadin. Denies any fevers chills any abnormal weight loss. Does have a history of cancer. Rates the severity of pain a 7 out of 10. Denies any true weakness.     Past Medical History  Diagnosis Date  . History of BPH     benign prostatic hypertrophy  . Paroxysmal atrial fibrillation (HCC)     postoperative after lung surgery  . Osteoarthrosis, unspecified whether generalized or localized, lower leg     knee  . Labyrinthitis, unspecified   . Esophageal reflux   . Cerebrovascular disease     s/p prior stroke  . Unspecified essential hypertension   . Other and unspecified hyperlipidemia   . Depressive disorder, not elsewhere classified   . Dysrhythmia     history Atrial Fib, converted with Amiodarone use- no longer taking.  [name] Heart murmur   . Cancer [facility])     lung ca dx'd 2010 - Dr. Earlie Server following.  . Malignant neoplasm middle lobe, bronchus or lung     right lung. Dx 07/27/08, sp/p resection 7/10.   [name] Headache     migraines occ., none in months.  . Renal cyst, right     being followed  . CAD (coronary artery disease)     s/p MI 1976, normal myoview 7/11  .  Stroke [facility])     TIA March 2000     no side effects now   Past Surgical History  Procedure Laterality Date  . Esophagogastroduodenoscopy  05/14/04  . Bunionectomy  '89    left foot  . Inguinal hernia repair  '71    left  . Anthroscopic surgery  '10    right knee \  . Vat-rml lobectomy    . Right tkr  8/11  . Eye surgery      both eyes - 2011 [facility])  . Cataract extraction, bilateral    . Total knee arthroplasty Left 09/24/2014    Procedure: LEFT TOTAL KNEE ARTHROPLASTY;  Surgeon: Gaynelle Arabian, MD;  Location: [facility];  Service: Orthopedics;  Laterality: Left;   Social History   Social History  . Marital Status: Married    Spouse Name: N/A  . Number of Children: N/A  . Years of Education: N/A   Occupational History  . railroad Counsellor     retired   Social History Main Topics  . Smoking status: Never Smoker   . Smokeless tobacco: Never Used  . Alcohol Use: No  . Drug Use: No  . Sexual Activity:    Partners: Female   Other Topics Concern  . None   Social History Narrative   HSG. Married '  18. 1 son '75. Lives with wife independently in [place]. 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 [name]. 09/10/09.    No Known Allergies Family History  Problem Relation Age of Onset  . Diabetes insipidus      2nd kin   . Coronary artery disease Neg Hx   . Colon cancer Neg Hx   . Prostate cancer Neg Hx     Past medical history, social, surgical and family history all reviewed in electronic medical record.  No pertanent information unless stated regarding to the chief complaint.   Review of Systems: No headache, visual changes, nausea, vomiting, diarrhea, constipation, dizziness, abdominal pain, skin rash, fevers, chills, night sweats, weight loss, swollen lymph nodes, body aches, joint swelling, muscle aches, chest pain, shortness of breath, mood changes.   Objective Blood pressure 132/74, pulse 72, weight  203 lb (92.08 kg).  General: No apparent distress alert and oriented x3 mood and affect normal, dressed appropriately.  HEENT: Pupils equal, extraocular movements intact  Respiratory: Patient's speak in full sentences and does not appear short of breath  Cardiovascular: No lower extremity edema, non tender, no erythema  Skin: Warm dry intact with no signs of infection or rash on extremities or on axial skeleton.  Abdomen: Soft nontender  Neuro: Cranial nerves II through XII are intact, neurovascularly intact in all extremities with 2+ DTRs and 2+ pulses.  Lymph: No lymphadenopathy of posterior or anterior cervical chain or axillae bilaterally.  Gait normal with good balance and coordination.  MSK:  Non tender with full range of motion and good stability and symmetric strength and tone of  hip, knee and ankles bilaterally. Arthritic changes of multiple joints  Neck: Inspection unremarkable. No palpable stepoffs. positiveSpurling's maneuver.seems to be in the C6-C7 distribution. Decreased range of motion lacking the last 5 of extension and flexion. Decreased side bending bilaterally Grip strength and sensation normal in bilateral hands Strength good C4 to T1 distribution No sensory change to C4 to T1 Negative Hoffman sign bilaterally Reflexes normal  Shoulder:right Inspection reveals no abnormalities, atrophy or asymmetry. Palpation is normal with no tenderness over AC joint or bicipital groove. ROM is full in all planes. Rotator cuff strength normal throughout. No signs of impingement with negative Neer and Hawkin's tests, empty can sign. Speeds and Yergason's tests normal. No labral pathology noted with negative Obrien's, negative clunk and good stability. Normal scapular function observed. No painful arc and no drop arm sign. No apprehension sign Contralateral unremarkable   Elbow:right Unremarkable to inspection. Range of motion full pronation, supination, flexion,  extension. Strength is full to all of the above directions Stable to varus, valgus stress. Negative moving valgus stress test. No discrete areas of tenderness to palpation. Ulnar nerve does not sublux. Negative cubital tunnel Tinel's. Contralateral unremarkable  Wrist:right Inspection normal with no visible erythema or swelling. ROM smooth and normal with good flexion and extension and ulnar/radial deviation that is symmetrical with opposite wrist. Palpation is normal over metacarpals, navicular, lunate, and TFCC; tendons without tenderness/ swelling No snuffbox tenderness. No tenderness over Canal of Guyon. Strength 5/5 in all directions without pain. Negative Finkelstein, tinel's and phalens. Negative Watson's test.  contralateral unremarkable   Impression and Recommendations:     This case required medical decision making of moderate complexity.      Note: This dictation was prepared with Dragon dictation along with smaller phrase technology. Any transcriptional errors that result from this process are unintentional.

## 2015-05-15 NOTE — Progress Notes (Signed)
I have reviewed and agree with the plan. 

## 2015-05-28 ENCOUNTER — Ambulatory Visit (INDEPENDENT_AMBULATORY_CARE_PROVIDER_SITE_OTHER): Payer: MEDICARE | Admitting: Family Medicine

## 2015-05-28 ENCOUNTER — Encounter: Payer: Self-pay | Admitting: Family Medicine

## 2015-05-28 ENCOUNTER — Other Ambulatory Visit: Payer: Self-pay | Admitting: *Deleted

## 2015-05-28 VITALS — BP 118/72 | HR 63 | Ht 70.0 in | Wt 203.0 lb

## 2015-05-28 DIAGNOSIS — R51 Headache: Secondary | ICD-10-CM

## 2015-05-28 DIAGNOSIS — M5010 Cervical disc disorder with radiculopathy, unspecified cervical region: Secondary | ICD-10-CM | POA: Diagnosis not present

## 2015-05-28 DIAGNOSIS — R519 Headache, unspecified: Secondary | ICD-10-CM

## 2015-05-28 DIAGNOSIS — I679 Cerebrovascular disease, unspecified: Secondary | ICD-10-CM

## 2015-05-28 DIAGNOSIS — I2510 Atherosclerotic heart disease of native coronary artery without angina pectoris: Secondary | ICD-10-CM

## 2015-05-28 NOTE — Patient Instructions (Addendum)
Good to see you  Lets stop the medicine for now.  Ice as much as you want for now.  We will get MRI of your neck and brain at this moment.  With the new headaches I need to rule out stroke.  If worsening headaches from now or weakness or difficulty speaking please call 911 or go to the emergency room immediatly.  See me again 1-2 days after the MRI and we will discuss what is next.

## 2015-05-28 NOTE — Progress Notes (Signed)
Pre visit review using our clinic review tool, if applicable. No additional management support is needed unless otherwise documented below in the visit note. 

## 2015-05-28 NOTE — Assessment & Plan Note (Signed)
Believe the patient's weakness is likely still secondary to more of the neck. With patient's history ago as well as the headaches I do think with patient's history of a cerebrovascular disease an MRI of the brain is necessary. No consistent numbness noted at this time. Patient has no trouble with word finding. Discussed with patient as well as wife about further workup may be necessary and when to seek medical attention. They have complete understanding and would like to wait and try to do this outpatient. Patient is already on a statin and we can consider adding a ACE inhibitor the patient decided to wait. We will have patient come back again one day after the MRI of the brain and neck for further evaluation. If it appears to be more cervical radiculopathy could be a candidate for an epidural steroid injection. If possible stroke then patient will need to follow-up with neurology for further medical management.  Spent  25 minutes with patient face-to-face and had greater than 50% of counseling including as described above in assessment and plan.

## 2015-05-28 NOTE — Progress Notes (Signed)
[name] [place], [place] [address] Phone: [phone] Subjective:      CC: right arm pain Follow-up  [id number] Bernard [name] is a 82 y.o. male coming in with complaint of right arm pain. Patient was having what appeared to be more radicular symptoms. Patient did have x-rays of his neck that were independently visualized by me. X-ray show moderate right-sided and C4-C5 foraminal narrowing and moderate arthritis at multiple levels. Patient was started on gabapentin at night and was given home exercises working on posture and we discussed ergonomics. Patient states Unfortunately he still some pain gabapentin and it causes nausea. Patient also noticed that he was started having headaches that seemed to be on the left side. Seem to make the pain on the right neck and arm worse. States that from time to time when he is walking he gets a severe headache which causes some nausea and may be even a little blurred vision and he has to go sit down. This is somewhat new. Has a past medical history significant for TIA the patient states that this seems to be different. Concerned it is more the medication. Denies any weakness of the extremities but states that he continues to have the dull throbbing aching soreness of the right arm from the shoulder down to the wrist. No improvement from previous exam.     Past Medical History  Diagnosis Date  . History of BPH     benign prostatic hypertrophy  . Paroxysmal atrial fibrillation (HCC)     postoperative after lung surgery  . Osteoarthrosis, unspecified whether generalized or localized, lower leg     knee  . Labyrinthitis, unspecified   . Esophageal reflux   . Cerebrovascular disease     s/p prior stroke  . Unspecified essential hypertension   . Other and unspecified hyperlipidemia   . Depressive disorder, not elsewhere classified   . Dysrhythmia     history Atrial Fib, converted with Amiodarone use-  no longer taking.  [name] Heart murmur   . Cancer [facility])     lung ca dx'd 2010 - Dr. Earlie Server following.  . Malignant neoplasm middle lobe, bronchus or lung     right lung. Dx 07/27/08, sp/p resection 7/10.   [name] Headache     migraines occ., none in months.  . Renal cyst, right     being followed  . CAD (coronary artery disease)     s/p MI 1976, normal myoview 7/11  . Stroke [facility])     TIA March 2000     no side effects now   Past Surgical History  Procedure Laterality Date  . Esophagogastroduodenoscopy  05/14/04  . Bunionectomy  '89    left foot  . Inguinal hernia repair  '71    left  . Anthroscopic surgery  '10    right knee \  . Vat-rml lobectomy    . Right tkr  8/11  . Eye surgery      both eyes - 2011 [facility])  . Cataract extraction, bilateral    . Total knee arthroplasty Left 09/24/2014    Procedure: LEFT TOTAL KNEE ARTHROPLASTY;  Surgeon: Gaynelle Arabian, MD;  Location: [facility];  Service: Orthopedics;  Laterality: Left;   Social History   Social History  . Marital Status: Married    Spouse Name: N/A  . Number of Children: N/A  . Years of Education: N/A   Occupational History  . railroad Counsellor  retired   Social History Main Topics  . Smoking status: Never Smoker   . Smokeless tobacco: Never Used  . Alcohol Use: No  . Drug Use: No  . Sexual Activity:    Partners: Female   Other Topics Concern  . None   Social History Narrative   HSG. Married '70. 1 son '75. Lives with wife independently in [place]. 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 [name]. 09/10/09.    No Known Allergies Family History  Problem Relation Age of Onset  . Diabetes insipidus      2nd kin   . Coronary artery disease Neg Hx   . Colon cancer Neg Hx   . Prostate cancer Neg Hx     Past medical history, social, surgical and family history all reviewed in electronic medical record.  No pertanent information unless stated  regarding to the chief complaint.   Review of Systems: No headache, visual changes, nausea, vomiting, diarrhea, constipation, dizziness, abdominal pain, skin rash, fevers, chills, night sweats, weight loss, swollen lymph nodes, body aches, joint swelling, muscle aches, chest pain, shortness of breath, mood changes.   Objective Blood pressure 118/72, pulse 63, height '5\' 10"'$  (1.778 m), weight 203 lb (92.08 kg), SpO2 96 %.  General: No apparent distress alert and oriented x3 mood and affect normal, dressed appropriately.  HEENT: Pupils equal, extraocular movements intact  Respiratory: Patient's speak in full sentences and does not appear short of breath  Cardiovascular: No lower extremity edema, non tender, no erythema  Skin: Warm dry intact with no signs of infection or rash on extremities or on axial skeleton.  Abdomen: Soft nontender  Neuro: Cranial nerves II through XII are intact, neurovascularly intact in all extremities with 2+ DTRs and 2+ pulses.  Lymph: No lymphadenopathy of posterior or anterior cervical chain or axillae bilaterally.  Gait normal with good balance and coordination.  MSK:  Non tender with full range of motion and good stability and symmetric strength and tone of  hip, knee and ankles bilaterally. Arthritic changes of multiple joints  Neck: Inspection unremarkable. No palpable stepoffs. positiveSpurling's maneuver.seems to be in the C6-C7 distribution still present Decreased range of motion lacking the last 5 of extension and flexion. Decreased side bending bilaterally possibly worse than previous exam. Grip strength and sensation normal in bilateral hands Strength good C4 to T1 distribution No sensory change to C4 to T1 Negative Hoffman sign bilaterally Reflexes normal  Shoulder:right Inspection reveals no abnormalities, atrophy or asymmetry. Palpation is normal with no tenderness over AC joint or bicipital groove. ROM is full in all planes. Possible mild  weakness compared to the contralateral side of the arm No signs of impingement with negative Neer and Hawkin's tests, empty can sign. Speeds and Yergason's tests normal. No labral pathology noted with negative Obrien's, negative clunk and good stability. Normal scapular function observed. No painful arc and no drop arm sign. No apprehension sign Contralateral unremarkable   Elbow:right Unremarkable to inspection. Range of motion full pronation, supination, flexion, extension. Strength is full to all of the above directions Stable to varus, valgus stress. Negative moving valgus stress test. No discrete areas of tenderness to palpation. Ulnar nerve does not sublux. Negative cubital tunnel Tinel's. Contralateral unremarkable  Wrist:right Inspection normal with no visible erythema or swelling. ROM smooth and normal with good flexion and extension and ulnar/radial deviation that is symmetrical with opposite wrist. Palpation is normal over metacarpals, navicular, lunate, and TFCC; tendons  without tenderness/ swelling No snuffbox tenderness. No tenderness over Canal of Guyon. Strength 5/5 in all directions without pain. Negative Finkelstein, tinel's and phalens. Negative Watson's test.  contralateral unremarkable   Impression and Recommendations:     This case required medical decision making of moderate complexity.      Note: This dictation was prepared with Dragon dictation along with smaller phrase technology. Any transcriptional errors that result from this process are unintentional.

## 2015-05-28 NOTE — Assessment & Plan Note (Signed)
History of a previous left coronal infarct and mild small vessel disease back on an MRI in 2012. We need to make sure that there is no acute strokes that seemed to be occurring at this time. Patient is going to come back and see me 1-2 days after and we will evaluate. Patient knows that if any worsening symptoms to seek medical attention immediately.

## 2015-05-31 ENCOUNTER — Ambulatory Visit
Admission: RE | Admit: 2015-05-31 | Discharge: 2015-05-31 | Disposition: A | Discharge status: Home / Self Care | Payer: MEDICARE | Source: Ambulatory Visit | Attending: Family Medicine | Admitting: Family Medicine

## 2015-05-31 ENCOUNTER — Telehealth: Payer: Self-pay | Admitting: Internal Medicine

## 2015-05-31 DIAGNOSIS — R51 Headache: Principal | ICD-10-CM

## 2015-05-31 DIAGNOSIS — M5010 Cervical disc disorder with radiculopathy, unspecified cervical region: Secondary | ICD-10-CM

## 2015-05-31 DIAGNOSIS — R519 Headache, unspecified: Secondary | ICD-10-CM

## 2015-05-31 NOTE — Telephone Encounter (Signed)
Have not called patient but has appointment currently scheduled for Thursday as well.

## 2015-05-31 NOTE — Telephone Encounter (Signed)
Patient is requesting to be worked in Tuesday.  States having MRI 4/7

## 2015-06-02 ENCOUNTER — Other Ambulatory Visit: Payer: MEDICARE

## 2015-06-03 NOTE — Telephone Encounter (Signed)
Discussed results with pt. He decided he would like to keep the appt w/ dr Tamala Julian on Thursday to discuss mri & possible epidural injection.

## 2015-06-03 NOTE — Telephone Encounter (Signed)
lmovm for pt to return call.  

## 2015-06-04 ENCOUNTER — Other Ambulatory Visit: Payer: MEDICARE

## 2015-06-06 ENCOUNTER — Ambulatory Visit (INDEPENDENT_AMBULATORY_CARE_PROVIDER_SITE_OTHER): Payer: MEDICARE | Admitting: Family Medicine

## 2015-06-06 ENCOUNTER — Encounter: Payer: Self-pay | Admitting: Family Medicine

## 2015-06-06 ENCOUNTER — Other Ambulatory Visit (INDEPENDENT_AMBULATORY_CARE_PROVIDER_SITE_OTHER): Payer: MEDICARE

## 2015-06-06 VITALS — BP 120/72 | HR 70 | Ht 70.0 in | Wt 205.0 lb

## 2015-06-06 DIAGNOSIS — M5010 Cervical disc disorder with radiculopathy, unspecified cervical region: Secondary | ICD-10-CM

## 2015-06-06 DIAGNOSIS — G5601 Carpal tunnel syndrome, right upper limb: Secondary | ICD-10-CM

## 2015-06-06 DIAGNOSIS — I2510 Atherosclerotic heart disease of native coronary artery without angina pectoris: Secondary | ICD-10-CM

## 2015-06-06 NOTE — Progress Notes (Signed)
[name] [place], [place] [address] Phone: [phone] Subjective:      CC: right arm pain Follow-up  Bernard [name] is a 82 y.o. male coming in with complaint of right arm pain. Patient was having what appeared to be more radicular symptoms. Patient did have x-rays of his neck that were independently visualized by me. X-ray show moderate right-sided and C4-C5 foraminal narrowing and moderate arthritis at multiple levels. Patient was having worsening symptoms and there was even a potential [place] TIA. Patient was sent for an MRI of the head as well as neck. Patient's MRI of the head was independently visualized by me did not show any signs of recent ischemic attacks. Still some mild microvascular disease. No significant progression since previous imaging. Patient's MRI of the cervical spine though did show multilevel foraminal encroachment at multiple levels with moderate to severe mostly of C5-C6 that would correspond with patient's symptoms.  Patient feels that he does not know if it is his neck. Still think could be secondary to his elbow or his wrist. States though that the pain can be severe enough that it brings tears to his eyes. States that the gabapentin helps out a little bit but unfortunately causes other side effects at higher doses. States that topical anti-inflammatories is somewhat helpful.      Past Medical History  Diagnosis Date  . History of BPH     benign prostatic hypertrophy  . Paroxysmal atrial fibrillation (HCC)     postoperative after lung surgery  . Osteoarthrosis, unspecified whether generalized or localized, lower leg     knee  . Labyrinthitis, unspecified   . Esophageal reflux   . Cerebrovascular disease     s/p prior stroke  . Unspecified essential hypertension   . Other and unspecified hyperlipidemia   . Depressive disorder, not elsewhere classified   . Dysrhythmia     history Atrial Fib,  converted with Amiodarone use- no longer taking.  [name] Heart murmur   . Cancer [facility])     lung ca dx'd 2010 - Dr. Earlie Server following.  . Malignant neoplasm middle lobe, bronchus or lung     right lung. Dx 07/27/08, sp/p resection 7/10.   [name] Headache     migraines occ., none in months.  . Renal cyst, right     being followed  . CAD (coronary artery disease)     s/p MI 1976, normal myoview 7/11  . Stroke [facility])     TIA March 2000     no side effects now   Past Surgical History  Procedure Laterality Date  . Esophagogastroduodenoscopy  05/14/04  . Bunionectomy  '89    left foot  . Inguinal hernia repair  '71    left  . Anthroscopic surgery  '10    right knee \  . Vat-rml lobectomy    . Right tkr  8/11  . Eye surgery      both eyes - 2011 [facility])  . Cataract extraction, bilateral    . Total knee arthroplasty Left 09/24/2014    Procedure: LEFT TOTAL KNEE ARTHROPLASTY;  Surgeon: Gaynelle Arabian, MD;  Location: [facility] ORS;  Service: Orthopedics;  Laterality: Left;   Social History   Social History  . Marital Status: Married    Spouse Name: N/A  . Number of Children: N/A  . Years of Education: N/A   Occupational History  . railroad Counsellor     retired   Science writer  History Main Topics  . Smoking status: Never Smoker   . Smokeless tobacco: Never Used  . Alcohol Use: No  . Drug Use: No  . Sexual Activity:    Partners: Female   Other Topics Concern  . None   Social History Narrative   HSG. Married '70. 1 son '75. Lives with wife independently in [place]. 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 [name]. 09/10/09.    No Known Allergies Family History  Problem Relation Age of Onset  . Diabetes insipidus      2nd kin   . Coronary artery disease Neg Hx   . Colon cancer Neg Hx   . Prostate cancer Neg Hx     Past medical history, social, surgical and family history all reviewed in electronic medical record.  No  pertanent information unless stated regarding to the chief complaint.   Review of Systems: No headache, visual changes, nausea, vomiting, diarrhea, constipation, dizziness, abdominal pain, skin rash, fevers, chills, night sweats, weight loss, swollen lymph nodes, body aches, joint swelling, muscle aches, chest pain, shortness of breath, mood changes.   Objective Blood pressure 120/72, pulse 70, height '5\' 10"'$  (1.778 m), weight 205 lb (92.987 kg), SpO2 95 %.  General: No apparent distress alert and oriented x3 mood and affect normal, dressed appropriately.  HEENT: Pupils equal, extraocular movements intact  Respiratory: Patient's speak in full sentences and does not appear short of breath  Cardiovascular: No lower extremity edema, non tender, no erythema  Skin: Warm dry intact with no signs of infection or rash on extremities or on axial skeleton.  Abdomen: Soft nontender  Neuro: Cranial nerves II through XII are intact, neurovascularly intact in all extremities with 2+ DTRs and 2+ pulses.  Lymph: No lymphadenopathy of posterior or anterior cervical chain or axillae bilaterally.  Gait normal with good balance and coordination.  MSK:  Non tender with full range of motion and good stability and symmetric strength and tone of  hip, knee and ankles bilaterally. Arthritic changes of multiple joints  Neck: Inspection unremarkable. No palpable stepoffs. positiveSpurling's maneuver.seems to be in the C6-C7 distribution still present Decreased range of motion lacking the last 5 of extension and flexion. Decreased side bending bilaterally possibly worse than previous exam. Grip strength and sensation normal in bilateral hands Strength good C4 to T1 distribution No sensory change to C4 to T1 Negative Hoffman sign bilaterally Reflexes normal    Elbow:right Unremarkable to inspection. Range of motion full pronation, supination, flexion, extension. Strength is full to all of the above  directions Stable to varus, valgus stress. Negative moving valgus stress test. No discrete areas of tenderness to palpation. More diffuse tenderness in previous exam Ulnar nerve does not sublux. Negative cubital tunnel Tinel's. Contralateral unremarkable  Wrist:right Inspection normal with no visible erythema or swelling. ROM smooth and normal with good flexion and extension and ulnar/radial deviation that is symmetrical with opposite wrist. Palpation is normal over metacarpals, navicular, lunate, and TFCC; tendons without tenderness/ swelling No snuffbox tenderness. No tenderness over Canal of Guyon. Strength 5/5 in all directions without pain. Negative Finkelstein, tinel's and phalens. Mild discomfort with palpation over the median nerve Negative Watson's test.  contralateral unremarkable  \Procedure: Real-time Ultrasound Guided Injection of right carpal tunnel Device: GE Logiq E  Ultrasound guided injection is preferred based studies that show increased duration, increased effect, greater accuracy, decreased procedural pain, increased response rate with ultrasound guided versus blind injection.  Verbal  informed consent obtained.  Time-out conducted.  Noted no overlying erythema, induration, or other signs of local infection.  Skin prepped in a sterile fashion.  Local anesthesia: Topical Ethyl chloride.  With sterile technique and under real time ultrasound guidance:  median nerve visualized.  23g 5/8 inch needle inserted distal to proximal approach into nerve sheath. Pictures taken nfor needle placement. Patient did have injection of 2 cc of 1% lidocaine, 1 cc of 0.5% Marcaine, and 1 cc of Kenalog 40 mg/dL. Completed without difficulty  Pain immediately resolved suggesting accurate placement of the medication.  Advised to call if fevers/chills, erythema, induration, drainage, or persistent bleeding.  Images permanently stored and available for review in the ultrasound unit.   Impression: Technically successful ultrasound guided injection.    Impression and Recommendations:     This case required medical decision making of moderate complexity.      Note: This dictation was prepared with Dragon dictation along with smaller phrase technology. Any transcriptional errors that result from this process are unintentional.

## 2015-06-06 NOTE — Progress Notes (Signed)
Pre visit review using our clinic review tool, if applicable. No additional management support is needed unless otherwise documented below in the visit note. 

## 2015-06-06 NOTE — Assessment & Plan Note (Signed)
Discussed with patient again at great length. I do feel that this is likely secondary to his cervical spine. We discussed that we could further evaluate this with an EMG which patient declined. The like it was his wrist and we did do a diagnostic carpal tunnel injection. We will see if this makes any significant improvement. Encouraged him though that if no improvement in 2 weeks I would consider an epidural in the neck. Patient will send a message back in 2 weeks and we will consider EMG verse possible epidural. Encourage him to continue the same medications, icing, as well as the exercises and working on posture.  Spent  25 minutes with patient face-to-face and had greater than 50% of counseling including as described above in assessment and plan.

## 2015-06-06 NOTE — Patient Instructions (Signed)
Good to see you  Ice is your friend Bonne Dolores the injection in the wrist today to see if it helps I would say though if not better in 2 weeks we do need to consider an epidural in your neck Conitnue the gabapentin  Send me a message in 2 weeks and tell me how you are doing.

## 2015-06-12 ENCOUNTER — Ambulatory Visit: Payer: MEDICARE

## 2015-06-14 ENCOUNTER — Ambulatory Visit (INDEPENDENT_AMBULATORY_CARE_PROVIDER_SITE_OTHER): Payer: MEDICARE | Admitting: General Practice

## 2015-06-14 DIAGNOSIS — I4891 Unspecified atrial fibrillation: Secondary | ICD-10-CM | POA: Diagnosis not present

## 2015-06-14 DIAGNOSIS — Z5181 Encounter for therapeutic drug level monitoring: Secondary | ICD-10-CM

## 2015-06-14 DIAGNOSIS — G459 Transient cerebral ischemic attack, unspecified: Secondary | ICD-10-CM

## 2015-06-14 LAB — POCT INR: INR: 2.9

## 2015-06-14 NOTE — Progress Notes (Signed)
I have reviewed and agree with the plan. 

## 2015-06-14 NOTE — Progress Notes (Signed)
Pre visit review using our clinic review tool, if applicable. No additional management support is needed unless otherwise documented below in the visit note. 

## 2015-07-10 ENCOUNTER — Ambulatory Visit (INDEPENDENT_AMBULATORY_CARE_PROVIDER_SITE_OTHER): Payer: MEDICARE | Admitting: General Practice

## 2015-07-10 DIAGNOSIS — I4891 Unspecified atrial fibrillation: Secondary | ICD-10-CM

## 2015-07-10 DIAGNOSIS — Z5181 Encounter for therapeutic drug level monitoring: Secondary | ICD-10-CM

## 2015-07-10 DIAGNOSIS — G459 Transient cerebral ischemic attack, unspecified: Secondary | ICD-10-CM

## 2015-07-10 LAB — POCT INR: INR: 2.5

## 2015-07-10 NOTE — Progress Notes (Signed)
I have reviewed and agree with the plan. 

## 2015-07-10 NOTE — Progress Notes (Signed)
Pre visit review using our clinic review tool, if applicable. No additional management support is needed unless otherwise documented below in the visit note. 

## 2015-08-06 ENCOUNTER — Encounter: Payer: Self-pay | Admitting: Internal Medicine

## 2015-08-06 ENCOUNTER — Other Ambulatory Visit (INDEPENDENT_AMBULATORY_CARE_PROVIDER_SITE_OTHER): Payer: MEDICARE

## 2015-08-06 ENCOUNTER — Ambulatory Visit (INDEPENDENT_AMBULATORY_CARE_PROVIDER_SITE_OTHER): Payer: MEDICARE | Admitting: Internal Medicine

## 2015-08-06 VITALS — BP 120/68 | HR 58 | Wt 210.0 lb

## 2015-08-06 DIAGNOSIS — I4891 Unspecified atrial fibrillation: Secondary | ICD-10-CM | POA: Diagnosis not present

## 2015-08-06 DIAGNOSIS — E785 Hyperlipidemia, unspecified: Secondary | ICD-10-CM | POA: Diagnosis not present

## 2015-08-06 DIAGNOSIS — I2510 Atherosclerotic heart disease of native coronary artery without angina pectoris: Secondary | ICD-10-CM | POA: Diagnosis not present

## 2015-08-06 DIAGNOSIS — K439 Ventral hernia without obstruction or gangrene: Secondary | ICD-10-CM | POA: Insufficient documentation

## 2015-08-06 DIAGNOSIS — I10 Essential (primary) hypertension: Secondary | ICD-10-CM | POA: Diagnosis not present

## 2015-08-06 DIAGNOSIS — C3491 Malignant neoplasm of unspecified part of right bronchus or lung: Secondary | ICD-10-CM | POA: Diagnosis not present

## 2015-08-06 LAB — BASIC METABOLIC PANEL
BUN: 18 mg/dL (ref 6–23)
CALCIUM: 9 mg/dL (ref 8.4–10.5)
CO2: 28 meq/L (ref 19–32)
Chloride: 106 mEq/L (ref 96–112)
Creatinine, Ser: 1.06 mg/dL (ref 0.40–1.50)
GFR: 71.35 mL/min (ref >60.00)
GLUCOSE: 93 mg/dL (ref 70–99)
POTASSIUM: 4.2 meq/L (ref 3.5–5.1)
SODIUM: 141 meq/L (ref 135–145)

## 2015-08-06 NOTE — Progress Notes (Signed)
Subjective:  Patient ID: Bernard [name], male    DOB: [birth year]  Age: 82 y.o. MRN: [id number]  CC: No chief complaint on file.   HPI CALUB TARNOW presents for dyslipidemia, BPH, LBP f/u. Pt had a URI x 4 wks - resolved. Cervical radiculopathy is better - Dr Tamala Julian. Pt declined shots   Outpatient Prescriptions Prior to Visit  Medication Sig Dispense Refill  . APAP-Isometheptene-Dichloral 325-65-100 MG per capsule Take 1 capsule by mouth 4 (four) times daily as needed. (Patient taking differently: Take 1 capsule by mouth 4 (four) times daily as needed (headache). ) 30 capsule 3  . cholecalciferol (VITAMIN D) 1000 UNITS tablet Take 1,000 Units by mouth daily.    . diazepam (VALIUM) 5 MG tablet Take 1 tablet (5 mg total) by mouth 2 (two) times daily. 180 tablet 1  . finasteride (PROSCAR) 5 MG tablet TAKE 1 TABLET EVERY DAY 90 tablet 3  . multivitamin-lutein (OCUVITE-LUTEIN) CAPS Take 1 capsule by mouth daily.    . nitroGLYCERIN (NITROSTAT) 0.4 MG SL tablet Place 1 tablet (0.4 mg total) under the tongue every 5 (five) minutes as needed. 25 tablet 3  . omeprazole (PRILOSEC) 20 MG capsule Take 20 mg by mouth daily as needed (heartburn or acid reflux).    . simvastatin (ZOCOR) 20 MG tablet TAKE 1 TABLET EVERY DAY 90 tablet 3  . terazosin (HYTRIN) 5 MG capsule TAKE 1 CAPSULE ONE TIME DAILY 90 capsule 3  . warfarin (COUMADIN) 5 MG tablet 90 day 90 tablet 2  . gabapentin (NEURONTIN) 100 MG capsule Take 2 capsules (200 mg total) by mouth at bedtime. (Patient not taking: Reported on 08/06/2015) 60 capsule 3   No facility-administered medications prior to visit.    ROS Review of Systems  Constitutional: Negative for appetite change, fatigue and unexpected weight change.  HENT: Negative for congestion, nosebleeds, sneezing, sore throat and trouble swallowing.   Eyes: Negative for itching and visual disturbance.  Respiratory: Negative for cough.   Cardiovascular: Negative for chest pain,  palpitations and leg swelling.  Gastrointestinal: Negative for nausea, diarrhea, blood in stool and abdominal distention.  Genitourinary: Negative for frequency and hematuria.  Musculoskeletal: Positive for back pain and arthralgias. Negative for joint swelling, gait problem and neck pain.  Skin: Negative for rash.  Neurological: Negative for dizziness, tremors, speech difficulty and weakness.  Psychiatric/Behavioral: Negative for sleep disturbance, dysphoric mood and agitation. The patient is not nervous/anxious.     Objective:  BP 120/68 mmHg  Pulse 58  Wt 210 lb (95.255 kg)  SpO2 94%  BP Readings from Last 3 Encounters:  08/06/15 120/68  06/06/15 120/72  05/28/15 118/72    Wt Readings from Last 3 Encounters:  08/06/15 210 lb (95.255 kg)  06/06/15 205 lb (92.987 kg)  05/28/15 203 lb (92.08 kg)    Physical Exam  Constitutional: He is oriented to person, place, and time. He appears well-developed. No distress.  NAD  HENT:  Mouth/Throat: Oropharynx is clear and moist.  Eyes: Conjunctivae are normal. Pupils are equal, round, and reactive to light.  Neck: Normal range of motion. No JVD present. No thyromegaly present.  Cardiovascular: Normal rate, regular rhythm, normal heart sounds and intact distal pulses.  Exam reveals no gallop and no friction rub.   No murmur heard. Pulmonary/Chest: Effort normal and breath sounds normal. No respiratory distress. He has no wheezes. He has no rales. He exhibits no tenderness.  Abdominal: Soft. Bowel sounds are normal. He exhibits no distension and  no mass. There is no tenderness. There is no rebound and no guarding.  Musculoskeletal: Normal range of motion. He exhibits no edema or tenderness.  Lymphadenopathy:    He has no cervical adenopathy.  Neurological: He is alert and oriented to person, place, and time. He has normal reflexes. No cranial nerve deficit. He exhibits normal muscle tone. He displays a negative Romberg sign. Coordination  and gait normal.  Skin: Skin is warm and dry. No rash noted.  Psychiatric: He has a normal mood and affect. His behavior is normal. Judgment and thought content normal.  ventr hernia B knees w/scars  Lab Results  Component Value Date   WBC 6.7 02/01/2015   HGB 13.3 02/01/2015   HCT 41.3 02/01/2015   PLT 154.0 02/01/2015   GLUCOSE 95 02/01/2015   CHOL 114 02/01/2015   TRIG 60.0 02/01/2015   HDL 37.40* 02/01/2015   LDLCALC 65 02/01/2015   ALT 11 02/01/2015   AST 16 02/01/2015   NA 141 02/01/2015   K 4.6 02/01/2015   CL 106 02/01/2015   CREATININE 0.94 02/01/2015   BUN 17 02/01/2015   CO2 29 02/01/2015   TSH 1.69 02/01/2015   PSA 2.06 04/22/2009   INR 2.5 07/10/2015   HGBA1C * 10/23/2009    6.0 (NOTE)                                                                       According to the ADA Clinical Practice Recommendations for 2011, when HbA1c is used as a screening test:   >=6.5%   Diagnostic of Diabetes Mellitus           (if abnormal result  is confirmed)  5.7-6.4%   Increased risk of developing Diabetes Mellitus  References:Diagnosis and Classification of Diabetes Mellitus,Diabetes GHWE,9937,16(RCVEL 1):S62-S69 and Standards of Medical Care in         Diabetes - 2011,Diabetes Care,2011,34  (Suppl 1):S11-S61.    Mr Brain Wo Contrast  05/31/2015  CLINICAL DATA:  Worsening headaches. History lung cancer. Rule stroke EXAM: MRI HEAD WITHOUT CONTRAST TECHNIQUE: Multiplanar, multiecho pulse sequences of the brain and surrounding structures were obtained without intravenous contrast. COMPARISON:  MRI 01/26/2011 FINDINGS: Negative for acute infarct. Generalized atrophy.  Ventricular enlargement due to atrophy. Mild chronic microvascular ischemic change similar to the prior MRI. Chronic ischemia in the left external capsule unchanged. Brainstem and cerebellum intact Negative for intracranial hemorrhage.  Negative for mass or edema. Normal skull base. Circle of Willis patent. Bilateral lens  replacement. Mild mucosal edema paranasal sinuses. Pituitary not enlarged. IMPRESSION: Atrophy and chronic microvascular ischemic change. No acute infarct. Negative for intracranial mass lesion. If there is concern of metastatic disease, imaging post intravenous contrast is suggested. Electronically Signed   By: Franchot Gallo M.D.   On: 05/31/2015 12:46   Mr Cervical Spine Wo Contrast  05/31/2015  CLINICAL DATA:  Cervical disc disorder. Cervical radiculopathy. Right shoulder and arm pain. History lung cancer EXAM: MRI CERVICAL SPINE WITHOUT CONTRAST TECHNIQUE: Multiplanar, multisequence MR imaging of the cervical spine was performed. No intravenous contrast was administered. COMPARISON:  Cervical radiographs 05/15/2015. FINDINGS: Normal cervical alignment. Negative for fracture or mass lesion. No cord compression. Spinal cord signal normal. C2-3:  Negative C3-4: Mild  disc degeneration. Mild facet hypertrophy on the left. Mild left foraminal narrowing. C4-5: Mild disc degeneration. Bilateral facet hypertrophy. Moderate foraminal narrowing bilaterally left greater than right C5-6: Disc degeneration and spondylosis with uncinate spurring right greater than left. Bilateral facet hypertrophy. Marked right foraminal encroachment. Moderate left foraminal encroachment. No cord deformity C6-7: Disc degeneration and spondylosis. Moderate foraminal narrowing bilaterally right greater than left. C7-T1:  Negative IMPRESSION: Multilevel cervical spondylosis and facet degeneration. Multilevel foraminal encroachment due to spurring as above. This is most prominent on the right at C5-6. No acute disc protrusion Electronically Signed   By: Franchot Gallo M.D.   On: 05/31/2015 14:19    Assessment & Plan:   There are no diagnoses linked to this encounter. I am having Mr. Copelin maintain his multivitamin-lutein, nitroGLYCERIN, isometheptene-acetaminophen-dichloralphenazone, diazepam, omeprazole, simvastatin, terazosin,  finasteride, cholecalciferol, warfarin, and gabapentin.  No orders of the defined types were placed in this encounter.     Follow-up: No Follow-up on file.  [name], MD

## 2015-08-06 NOTE — Progress Notes (Signed)
Pre visit review using our clinic review tool, if applicable. No additional management support is needed unless otherwise documented below in the visit note. 

## 2015-08-06 NOTE — Assessment & Plan Note (Signed)
On simvastatin 20 mg daily

## 2015-08-06 NOTE — Assessment & Plan Note (Signed)
On Terazosin

## 2015-08-06 NOTE — Assessment & Plan Note (Signed)
chronic, no sx's Observation

## 2015-08-06 NOTE — Assessment & Plan Note (Signed)
F/u w/Dr Earlie Server  Chest CT in July

## 2015-08-06 NOTE — Assessment & Plan Note (Signed)
On Coumadin 

## 2015-08-07 ENCOUNTER — Ambulatory Visit (INDEPENDENT_AMBULATORY_CARE_PROVIDER_SITE_OTHER): Payer: MEDICARE | Admitting: General Practice

## 2015-08-07 DIAGNOSIS — Z5181 Encounter for therapeutic drug level monitoring: Secondary | ICD-10-CM | POA: Diagnosis not present

## 2015-08-07 DIAGNOSIS — I4891 Unspecified atrial fibrillation: Secondary | ICD-10-CM | POA: Diagnosis not present

## 2015-08-07 DIAGNOSIS — G459 Transient cerebral ischemic attack, unspecified: Secondary | ICD-10-CM

## 2015-08-07 LAB — POCT INR: INR: 2.9

## 2015-08-07 NOTE — Progress Notes (Signed)
I have reviewed and agree with the plan. 

## 2015-08-07 NOTE — Progress Notes (Signed)
Pre visit review using our clinic review tool, if applicable. No additional management support is needed unless otherwise documented below in the visit note. 

## 2015-09-05 ENCOUNTER — Ambulatory Visit (HOSPITAL_COMMUNITY)
Admission: RE | Admit: 2015-09-05 | Discharge: 2015-09-05 | Disposition: A | Discharge status: Home / Self Care | Payer: MEDICARE | Source: Ambulatory Visit | Attending: Internal Medicine | Admitting: Internal Medicine

## 2015-09-05 ENCOUNTER — Encounter (HOSPITAL_COMMUNITY): Payer: Self-pay

## 2015-09-05 ENCOUNTER — Other Ambulatory Visit (HOSPITAL_BASED_OUTPATIENT_CLINIC_OR_DEPARTMENT_OTHER): Payer: MEDICARE

## 2015-09-05 DIAGNOSIS — C3491 Malignant neoplasm of unspecified part of right bronchus or lung: Secondary | ICD-10-CM

## 2015-09-05 DIAGNOSIS — I2510 Atherosclerotic heart disease of native coronary artery without angina pectoris: Secondary | ICD-10-CM | POA: Insufficient documentation

## 2015-09-05 DIAGNOSIS — I700 Atherosclerosis of aorta: Secondary | ICD-10-CM | POA: Diagnosis not present

## 2015-09-05 DIAGNOSIS — Z902 Acquired absence of lung [part of]: Secondary | ICD-10-CM | POA: Insufficient documentation

## 2015-09-05 DIAGNOSIS — K449 Diaphragmatic hernia without obstruction or gangrene: Secondary | ICD-10-CM | POA: Diagnosis not present

## 2015-09-05 LAB — CBC WITH DIFFERENTIAL/PLATELET
BASO%: 0.3 % (ref 0.0–2.0)
BASOS ABS: 0 10*3/uL (ref 0.0–0.1)
EOS%: 1.4 % (ref 0.0–7.0)
Eosinophils Absolute: 0.1 10*3/uL (ref 0.0–0.5)
HEMATOCRIT: 40.6 % (ref 38.4–49.9)
HGB: 13.5 g/dL (ref 13.0–17.1)
LYMPH#: 2.6 10*3/uL (ref 0.9–3.3)
LYMPH%: 36.4 % (ref 14.0–49.0)
MCH: 30.4 pg (ref 27.2–33.4)
MCHC: 33.3 g/dL (ref 32.0–36.0)
MCV: 91.4 fL (ref 79.3–98.0)
MONO#: 0.6 10*3/uL (ref 0.1–0.9)
MONO%: 8.7 % (ref 0.0–14.0)
NEUT#: 3.9 10*3/uL (ref 1.5–6.5)
NEUT%: 53.2 % (ref 39.0–75.0)
PLATELETS: 142 10*3/uL (ref 140–400)
RBC: 4.44 10*6/uL (ref 4.20–5.82)
RDW: 14.3 % (ref 11.0–14.6)
WBC: 7.2 10*3/uL (ref 4.0–10.3)

## 2015-09-05 LAB — COMPREHENSIVE METABOLIC PANEL
ALT: 17 U/L (ref 0–55)
AST: 23 U/L (ref 5–34)
Albumin: 3.8 g/dL (ref 3.5–5.0)
Alkaline Phosphatase: 46 U/L (ref 40–150)
Anion Gap: 8 mEq/L (ref 3–11)
BUN: 19.3 mg/dL (ref 7.0–26.0)
CALCIUM: 8.8 mg/dL (ref 8.4–10.4)
CHLORIDE: 110 meq/L — AB (ref 98–109)
CO2: 26 meq/L (ref 22–29)
CREATININE: 1.1 mg/dL (ref 0.7–1.3)
EGFR: 66 mL/min/{1.73_m2} — ABNORMAL LOW (ref >90)
Glucose: 92 mg/dl (ref 70–140)
POTASSIUM: 4.3 meq/L (ref 3.5–5.1)
SODIUM: 144 meq/L (ref 136–145)
Total Bilirubin: 0.54 mg/dL (ref 0.20–1.20)
Total Protein: 6.8 g/dL (ref 6.4–8.3)

## 2015-09-05 MED ORDER — IOPAMIDOL (ISOVUE-300) INJECTION 61%
75.0000 mL | Freq: Once | INTRAVENOUS | Status: AC | PRN
  Administered 2015-09-05: 75 mL via INTRAVENOUS

## 2015-09-11 ENCOUNTER — Ambulatory Visit (HOSPITAL_BASED_OUTPATIENT_CLINIC_OR_DEPARTMENT_OTHER): Payer: MEDICARE | Admitting: Internal Medicine

## 2015-09-11 ENCOUNTER — Encounter: Payer: Self-pay | Admitting: Internal Medicine

## 2015-09-11 VITALS — BP 132/67 | HR 58 | Temp 97.8°F | Resp 17 | Ht 70.0 in | Wt 211.6 lb

## 2015-09-11 DIAGNOSIS — Z85118 Personal history of other malignant neoplasm of bronchus and lung: Secondary | ICD-10-CM

## 2015-09-11 DIAGNOSIS — C3491 Malignant neoplasm of unspecified part of right bronchus or lung: Secondary | ICD-10-CM

## 2015-09-11 NOTE — Progress Notes (Signed)
[facility] Telephone:([phone]) [phone]   Fax:(336) [phone]  OFFICE PROGRESS NOTE  [name], MD [place] [address]  PRINCIPAL DIAGNOSIS: Stage IA non-small-cell lung cancer (T1a N0 M0) adenocarcinoma diagnosed in June 2010.   PRIOR THERAPY: Status post right middle lobectomy under the care of Dr. Arlyce Dice on August 28, 2008.   CURRENT THERAPY: Observation.  INTERVAL HISTORY: Bernard [name] 82 y.o. male returns to the clinic today for annual follow up visit accompanied by his wife. He has been observation for the last 7 years with no significant evidence for disease recurrence. The patient is feeling fine today with no specific complaints. He had some neck pain recently and the patient had MRI of the cervical spine on 05/31/2015 that showed multilevel cervical spondylosis and facet degeneration. He is followed by orthopedic surgery as well as his primary care physician. He denied having any significant chest pain, shortness of breath, cough or hemoptysis. Marland KitchenHe has no weight loss or night sweats. The patient has repeat CT scan of the chest performed recently and he is here for evaluation and discussion of his scan results.  MEDICAL HISTORY: Past Medical History  Diagnosis Date  . History of BPH     benign prostatic hypertrophy  . Paroxysmal atrial fibrillation (HCC)     postoperative after lung surgery  . Osteoarthrosis, unspecified whether generalized or localized, lower leg     knee  . Labyrinthitis, unspecified   . Esophageal reflux   . Cerebrovascular disease     s/p prior stroke  . Unspecified essential hypertension   . Other and unspecified hyperlipidemia   . Depressive disorder, not elsewhere classified   . Dysrhythmia     history Atrial Fib, converted with Amiodarone use- no longer taking.  [name] Heart murmur   . Cancer [facility])     lung ca dx'd 2010 - Dr. Earlie Server following.  . Malignant neoplasm middle lobe, bronchus or lung     right lung.  Dx 07/27/08, sp/p resection 7/10.   [name] Headache     migraines occ., none in months.  . Renal cyst, right     being followed  . CAD (coronary artery disease)     s/p MI 1976, normal myoview 7/11  . Stroke [facility])     TIA March 2000     no side effects now    ALLERGIES:  has No Known Allergies.  MEDICATIONS:  Current Outpatient Prescriptions  Medication Sig Dispense Refill  . APAP-Isometheptene-Dichloral 325-65-100 MG per capsule Take 1 capsule by mouth 4 (four) times daily as needed. (Patient taking differently: Take 1 capsule by mouth 4 (four) times daily as needed (headache). ) 30 capsule 3  . cholecalciferol (VITAMIN D) 1000 UNITS tablet Take 1,000 Units by mouth daily.    . diazepam (VALIUM) 5 MG tablet Take 1 tablet (5 mg total) by mouth 2 (two) times daily. 180 tablet 1  . finasteride (PROSCAR) 5 MG tablet TAKE 1 TABLET EVERY DAY 90 tablet 3  . gabapentin (NEURONTIN) 100 MG capsule Take 2 capsules (200 mg total) by mouth at bedtime. (Patient not taking: Reported on 08/06/2015) 60 capsule 3  . multivitamin-lutein (OCUVITE-LUTEIN) CAPS Take 1 capsule by mouth daily.    . nitroGLYCERIN (NITROSTAT) 0.4 MG SL tablet Place 1 tablet (0.4 mg total) under the tongue every 5 (five) minutes as needed. 25 tablet 3  . omeprazole (PRILOSEC) 20 MG capsule Take 20 mg by mouth daily as needed (  heartburn or acid reflux).    . simvastatin (ZOCOR) 20 MG tablet TAKE 1 TABLET EVERY DAY 90 tablet 3  . terazosin (HYTRIN) 5 MG capsule TAKE 1 CAPSULE ONE TIME DAILY 90 capsule 3  . warfarin (COUMADIN) 5 MG tablet 90 day 90 tablet 2   No current facility-administered medications for this visit.    SURGICAL HISTORY:  Past Surgical History  Procedure Laterality Date  . Esophagogastroduodenoscopy  05/14/04  . Bunionectomy  '89    left foot  . Inguinal hernia repair  '71    left  . Anthroscopic surgery  '10    right knee \  . Vat-rml lobectomy    . Right tkr  8/11  . Eye surgery      both eyes - 2011  [facility])  . Cataract extraction, bilateral    . Total knee arthroplasty Left 09/24/2014    Procedure: LEFT TOTAL KNEE ARTHROPLASTY;  Surgeon: Gaynelle Arabian, MD;  Location: [facility];  Service: Orthopedics;  Laterality: Left;    REVIEW OF SYSTEMS:  A comprehensive review of systems was negative.   PHYSICAL EXAMINATION: General appearance: alert, cooperative and no distress Head: Normocephalic, without obvious abnormality, atraumatic Neck: no adenopathy Lymph nodes: Cervical, supraclavicular, and axillary nodes normal. Resp: clear to auscultation bilaterally Cardio: regular rate and rhythm, S1, S2 normal, no murmur, click, rub or gallop GI: soft, non-tender; bowel sounds normal; no masses,  no organomegaly Extremities: extremities normal, atraumatic, no cyanosis or edema  ECOG PERFORMANCE STATUS: 1 - Symptomatic but completely ambulatory  There were no vitals taken for this visit.  LABORATORY DATA: Lab Results  Component Value Date   WBC 7.2 09/05/2015   HGB 13.5 09/05/2015   HCT 40.6 09/05/2015   MCV 91.4 09/05/2015   PLT 142 09/05/2015      Chemistry      Component Value Date/Time   NA 144 09/05/2015 1117   NA 141 08/06/2015 1050   NA 141 09/29/2011 1218   K 4.3 09/05/2015 1117   K 4.2 08/06/2015 1050   K 4.4 09/29/2011 1218   CL 106 08/06/2015 1050   CL 103 09/29/2011 1218   CO2 26 09/05/2015 1117   CO2 28 08/06/2015 1050   CO2 28 09/29/2011 1218   BUN 19.3 09/05/2015 1117   BUN 18 08/06/2015 1050   BUN 16 09/29/2011 1218   CREATININE 1.1 09/05/2015 1117   CREATININE 1.06 08/06/2015 1050   CREATININE 1.3* 09/29/2011 1218      Component Value Date/Time   CALCIUM 8.8 09/05/2015 1117   CALCIUM 9.0 08/06/2015 1050   CALCIUM 8.8 09/29/2011 1218   ALKPHOS 46 09/05/2015 1117   ALKPHOS 39 02/01/2015 1229   ALKPHOS 38 09/29/2011 1218   AST 23 09/05/2015 1117   AST 16 02/01/2015 1229   AST 29 09/29/2011 1218   ALT 17 09/05/2015 1117   ALT 11 02/01/2015 1229   ALT 27  09/29/2011 1218   BILITOT 0.54 09/05/2015 1117   BILITOT 0.6 02/01/2015 1229   BILITOT 1.00 09/29/2011 1218       RADIOGRAPHIC STUDIES: Ct Chest W Contrast  09/05/2015  CLINICAL DATA:  Right middle lobe lung adenocarcinoma status post right middle lobectomy 08/28/2008. Restaging. EXAM: CT CHEST WITH CONTRAST TECHNIQUE: Multidetector CT imaging of the chest was performed during intravenous contrast administration. CONTRAST:  32m ISOVUE-300 IOPAMIDOL (ISOVUE-300) INJECTION 61% COMPARISON:  09/07/2014 chest CT. FINDINGS: Mediastinum/Nodes: Normal heart size. No significant pericardial fluid/thickening. Left anterior descending and right coronary atherosclerosis. Atherosclerotic nonaneurysmal  thoracic aorta. Normal caliber pulmonary arteries. No central pulmonary emboli. No discrete thyroid nodules. Unremarkable esophagus. No pathologically enlarged axillary, mediastinal or hilar lymph nodes. Lungs/Pleura: No pneumothorax. No pleural effusion. Right middle lobectomy. Stable mild scarring in the right lung adjacent to the lobectomy site. Subpleural right upper lobe 4 mm calcified granuloma. No acute consolidative airspace disease, significant pulmonary nodules or lung masses. Upper abdomen: Small hiatal hernia. Possible tiny layering gallstones in the gallbladder. No gallbladder wall thickening. Simple 8.2 cm renal cyst in the posterior upper right kidney. Stable granulomatous calcifications in the normal size spleen. Musculoskeletal: No aggressive appearing focal osseous lesions. Mild-to-moderate thoracic spondylosis. IMPRESSION: 1. Right middle lobectomy. No evidence of local tumor recurrence in the right lung. 2. No evidence of metastatic disease in the chest. 3. Additional findings include 2 vessel coronary atherosclerosis, aortic atherosclerosis, small hiatal hernia and possible cholelithiasis. Electronically Signed   By: Ilona Sorrel M.D.   On: 09/05/2015 16:00   ASSESSMENT AND PLAN: this is a very  pleasant 82 years old white male with a stage IA non-small cell lung cancer status post resection and has been observation since 2010 with no evidence for disease recurrence.  The recent CT scan of the chest showed no evidence for disease recurrence. I discussed the scan results with the patient and his wife.  I recommended for the patient to continue on observation with routine follow-up visit with his primary care physician. He was advised to call immediately if he has any concerning symptoms in the interval.  The patient voices understanding of current disease status and treatment options and is in agreement with the current care plan.  All questions were answered. The patient knows to call the clinic with any problems, questions or concerns. We can certainly see the patient much sooner if necessary.  Disclaimer: This note was dictated with voice recognition software. Similar sounding words can inadvertently be transcribed and may be missed upon review.

## 2015-09-18 ENCOUNTER — Ambulatory Visit (INDEPENDENT_AMBULATORY_CARE_PROVIDER_SITE_OTHER): Payer: MEDICARE | Admitting: General Practice

## 2015-09-18 DIAGNOSIS — I4891 Unspecified atrial fibrillation: Secondary | ICD-10-CM

## 2015-09-18 DIAGNOSIS — Z5181 Encounter for therapeutic drug level monitoring: Secondary | ICD-10-CM

## 2015-09-18 DIAGNOSIS — G459 Transient cerebral ischemic attack, unspecified: Secondary | ICD-10-CM

## 2015-09-18 LAB — POCT INR: INR: 2.4

## 2015-09-18 NOTE — Progress Notes (Signed)
I have reviewed and agree with the plan. 

## 2015-10-21 ENCOUNTER — Encounter: Payer: Self-pay | Admitting: Nurse Practitioner

## 2015-10-21 NOTE — Progress Notes (Signed)
Electrophysiology Office Note Date: 10/23/2015  ID:  Bernard, [name] 07-06-1932, MRN [id number]  PCP: [name], MD Electrophysiologist: Rayann Heman  CC: AF follow up  [name] is a 82 y.o. male seen today for Dr Rayann Heman.  He presents today for routine electrophysiology followup.  Since last being seen in our clinic, the patient reports doing very well.  He denies chest pain, palpitations, dyspnea, PND, orthopnea, nausea, vomiting, dizziness, syncope, edema, weight gain, or early satiety.  Echo 10/2014 demonstrated EF 55-60%, no RWMA, grade 1 diastolic dysfunction, mild MR.   Past Medical History:  Diagnosis Date  . CAD (coronary artery disease)    s/p MI 1976, normal myoview 7/11  . Depressive disorder, not elsewhere classified   . Esophageal reflux   . Headache    migraines occ., none in months.  . History of BPH    benign prostatic hypertrophy  . Labyrinthitis, unspecified   . Malignant neoplasm middle lobe, bronchus or lung    right lung. Dx 07/27/08, sp/p resection 7/10.   . Osteoarthrosis, unspecified whether generalized or localized, lower leg    knee  . Other and unspecified hyperlipidemia   . Paroxysmal atrial fibrillation (HCC)    postoperative after lung surgery  . Renal cyst, right    being followed  . Stroke [facility])    TIA March 2000     no side effects now  . Unspecified essential hypertension    Past Surgical History:  Procedure Laterality Date  . anthroscopic surgery  '10   right knee \  . BUNIONECTOMY  '89   left foot  . CATARACT EXTRACTION, BILATERAL    . ESOPHAGOGASTRODUODENOSCOPY  05/14/04  . EYE SURGERY     both eyes - 2011 [facility])  . INGUINAL HERNIA REPAIR  '71   left  . right TKR  8/11  . TOTAL KNEE ARTHROPLASTY Left 09/24/2014   Procedure: LEFT TOTAL KNEE ARTHROPLASTY;  Surgeon: Gaynelle Arabian, MD;  Location: [facility];  Service: Orthopedics;  Laterality: Left;  [name] VAT-RML lobectomy      Current Outpatient Prescriptions  Medication  Sig Dispense Refill  . cholecalciferol (VITAMIN D) 1000 UNITS tablet Take 1,000 Units by mouth daily.    . diazepam (VALIUM) 5 MG tablet Take 1 tablet (5 mg total) by mouth 2 (two) times daily. 180 tablet 1  . finasteride (PROSCAR) 5 MG tablet Take 5 mg by mouth daily.    [name] isometheptene-acetaminophen-dichloralphenazone (MIDRIN) 65-100-325 MG capsule Take 1 capsule by mouth 4 (four) times daily as needed for migraine or headache. Maximum 5 capsules in 12 hours for migraine headaches, 8 capsules in 24 hours for tension headaches.    . multivitamin-lutein (OCUVITE-LUTEIN) CAPS Take 1 capsule by mouth daily.    . nitroGLYCERIN (NITROSTAT) 0.4 MG SL tablet Place 0.4 mg under the tongue every 5 (five) minutes as needed for chest pain (3 DOSES MAX).    [name] omeprazole (PRILOSEC) 20 MG capsule Take 20 mg by mouth daily as needed (heartburn or acid reflux).    . simvastatin (ZOCOR) 20 MG tablet Take 20 mg by mouth daily.    [name] terazosin (HYTRIN) 5 MG capsule Take 5 mg by mouth at bedtime.    [name] warfarin (COUMADIN) 5 MG tablet Take 5 mg by mouth as directed.     No current facility-administered medications for this visit.     Allergies:   Review of patient's allergies indicates no known allergies.   Social History: Social History  Social History  . Marital status: Married    Spouse name: N/A  . Number of children: N/A  . Years of education: N/A   Occupational History  . railroad Counsellor Retired    retired   Social History Main Topics  . Smoking status: Never Smoker  . Smokeless tobacco: Never Used  . Alcohol use No  . Drug use: No  . Sexual activity: Yes    Partners: Female   Other Topics Concern  . Not on file   Social History Narrative   HSG. Married '70. 1 son '75. Lives with wife independently in [place]. 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 [name]. 09/10/09.     Family History: Family History  Problem  Relation Age of Onset  . Diabetes insipidus      2nd kin   . Coronary artery disease Neg Hx   . Colon cancer Neg Hx   . Prostate cancer Neg Hx   . Heart attack Neg Hx     Review of Systems: All other systems reviewed and are otherwise negative except as noted above.   Physical Exam: VS:  BP 130/82   Pulse (!) 55   Ht '5\' 10"'$  (1.778 m)   Wt 211 lb 3.2 oz (95.8 kg)   BMI 30.30 kg/m  , BMI Body mass index is 30.3 kg/m. Wt Readings from Last 3 Encounters:  10/23/15 211 lb 3.2 oz (95.8 kg)  09/11/15 211 lb 9.6 oz (96 kg)  08/06/15 210 lb (95.3 kg)    GEN- The patient is elderly appearing, alert and oriented x 3 today.   HEENT: normocephalic, atraumatic; sclera clear, conjunctiva pink; hearing intact; oropharynx clear; neck supple Lungs- Clear to ausculation bilaterally, normal work of breathing.  No wheezes, rales, rhonchi Heart- Regular rate and rhythm GI- soft, non-tender, non-distended, bowel sounds present Extremities- no clubbing, cyanosis, or edema; DP/PT/radial pulses 2+ bilaterally MS- no significant deformity or atrophy Skin- warm and dry, no rash or lesion  Psych- euthymic mood, full affect Neuro- strength and sensation are intact   EKG:  EKG is ordered today. The ekg ordered today shows sinus bradycardia, rate 55  Recent Labs: 02/01/2015: TSH 1.69 09/05/2015: ALT 17; BUN 19.3; Creatinine 1.1; HGB 13.5; Platelets 142; Potassium 4.3; Sodium 144    Other studies Reviewed: Additional studies/ records that were reviewed today include: Dr Jackalyn Lombard office notes, echo   Assessment and Plan: 1.  Paroxysmal atrial fibrillation Burden low by symptoms Continue Warfarin long term for CHADS2VASC of 6 Recent CBC stable  2.  HTN Stable No change required today  3.  CAD No recent ischemic symptoms Echo stable last year Myoview not done - pt cancelled.  In absence of recurrent shortness of breath or syncope, will not reschedule at this time.     Current medicines  are reviewed at length with the patient today.   The patient does not have concerns regarding his medicines.  The following changes were made today:  none  Labs/ tests ordered today include: CBC  Orders Placed This Encounter  Procedures  . EKG 12-Lead     Disposition:   Follow up with Dr Rayann Heman in 1 year    Signed, Chanetta Marshall, NP 10/23/2015 11:46 AM   [facility] [address] [place]  [address] [phone] (office) [phone] (fax)

## 2015-10-23 ENCOUNTER — Encounter: Payer: Self-pay | Admitting: Nurse Practitioner

## 2015-10-23 ENCOUNTER — Ambulatory Visit (INDEPENDENT_AMBULATORY_CARE_PROVIDER_SITE_OTHER): Payer: MEDICARE | Admitting: Nurse Practitioner

## 2015-10-23 VITALS — BP 130/82 | HR 55 | Ht 70.0 in | Wt 211.2 lb

## 2015-10-23 DIAGNOSIS — I480 Paroxysmal atrial fibrillation: Secondary | ICD-10-CM

## 2015-10-23 DIAGNOSIS — I2510 Atherosclerotic heart disease of native coronary artery without angina pectoris: Secondary | ICD-10-CM

## 2015-10-23 NOTE — Patient Instructions (Addendum)
Medication Instructions:  Your physician recommends that you continue on your current medications as directed. Please refer to the Current Medication list given to you today.  Labwork: NONE  Testing/Procedures: NONE  Follow-Up: Your physician wants you to follow-up in: 1 year with Dr. Rayann Heman. You will receive a reminder letter in the mail two months in advance. If you don't receive a letter, please call our office to schedule the follow-up appointment.   If you need a refill on your cardiac medications before your next appointment, please call your pharmacy.

## 2015-10-30 ENCOUNTER — Ambulatory Visit (INDEPENDENT_AMBULATORY_CARE_PROVIDER_SITE_OTHER): Payer: MEDICARE | Admitting: General Practice

## 2015-10-30 DIAGNOSIS — Z5181 Encounter for therapeutic drug level monitoring: Secondary | ICD-10-CM | POA: Diagnosis not present

## 2015-10-30 DIAGNOSIS — I4891 Unspecified atrial fibrillation: Secondary | ICD-10-CM | POA: Diagnosis not present

## 2015-10-30 DIAGNOSIS — G459 Transient cerebral ischemic attack, unspecified: Secondary | ICD-10-CM

## 2015-10-30 LAB — POCT INR: INR: 3.1

## 2015-11-08 NOTE — Progress Notes (Signed)
I have reviewed and agree with the plan. 

## 2015-12-11 ENCOUNTER — Ambulatory Visit (INDEPENDENT_AMBULATORY_CARE_PROVIDER_SITE_OTHER): Payer: MEDICARE | Admitting: General Practice

## 2015-12-11 ENCOUNTER — Ambulatory Visit: Payer: Self-pay

## 2015-12-11 DIAGNOSIS — Z23 Encounter for immunization: Secondary | ICD-10-CM

## 2015-12-11 DIAGNOSIS — I4891 Unspecified atrial fibrillation: Secondary | ICD-10-CM

## 2015-12-11 DIAGNOSIS — Z5181 Encounter for therapeutic drug level monitoring: Secondary | ICD-10-CM | POA: Diagnosis not present

## 2015-12-11 LAB — POCT INR: INR: 3.3

## 2015-12-11 NOTE — Progress Notes (Signed)
I have reviewed and agree with the plan. 

## 2015-12-11 NOTE — Patient Instructions (Signed)
Pre visit review using our clinic review tool, if applicable. No additional management support is needed unless otherwise documented below in the visit note. 

## 2016-01-10 ENCOUNTER — Ambulatory Visit (INDEPENDENT_AMBULATORY_CARE_PROVIDER_SITE_OTHER): Payer: MEDICARE | Admitting: General Practice

## 2016-01-10 DIAGNOSIS — Z5181 Encounter for therapeutic drug level monitoring: Secondary | ICD-10-CM

## 2016-01-10 DIAGNOSIS — G459 Transient cerebral ischemic attack, unspecified: Secondary | ICD-10-CM

## 2016-01-10 DIAGNOSIS — I4891 Unspecified atrial fibrillation: Secondary | ICD-10-CM

## 2016-01-10 LAB — POCT INR: INR: 3

## 2016-01-10 NOTE — Patient Instructions (Signed)
Pre visit review using our clinic review tool, if applicable. No additional management support is needed unless otherwise documented below in the visit note. 

## 2016-01-10 NOTE — Progress Notes (Signed)
I have reviewed and agree with the plan. 

## 2016-02-04 ENCOUNTER — Encounter: Payer: Self-pay | Admitting: Internal Medicine

## 2016-02-04 ENCOUNTER — Ambulatory Visit (INDEPENDENT_AMBULATORY_CARE_PROVIDER_SITE_OTHER): Payer: MEDICARE | Admitting: Internal Medicine

## 2016-02-04 ENCOUNTER — Other Ambulatory Visit (INDEPENDENT_AMBULATORY_CARE_PROVIDER_SITE_OTHER): Payer: MEDICARE

## 2016-02-04 VITALS — BP 120/84 | HR 76 | Wt 212.0 lb

## 2016-02-04 DIAGNOSIS — IMO0002 Reserved for concepts with insufficient information to code with codable children: Secondary | ICD-10-CM

## 2016-02-04 DIAGNOSIS — I2510 Atherosclerotic heart disease of native coronary artery without angina pectoris: Secondary | ICD-10-CM | POA: Diagnosis not present

## 2016-02-04 DIAGNOSIS — R310 Gross hematuria: Secondary | ICD-10-CM | POA: Diagnosis not present

## 2016-02-04 DIAGNOSIS — E785 Hyperlipidemia, unspecified: Secondary | ICD-10-CM

## 2016-02-04 DIAGNOSIS — I10 Essential (primary) hypertension: Secondary | ICD-10-CM | POA: Diagnosis not present

## 2016-02-04 DIAGNOSIS — M1710 Unilateral primary osteoarthritis, unspecified knee: Secondary | ICD-10-CM

## 2016-02-04 LAB — URINALYSIS
Bilirubin Urine: NEGATIVE
Hgb urine dipstick: NEGATIVE
Ketones, ur: NEGATIVE
Leukocytes, UA: NEGATIVE
Nitrite: NEGATIVE
Specific Gravity, Urine: 1.015 (ref 1.000–1.030)
Total Protein, Urine: NEGATIVE
Urine Glucose: NEGATIVE
Urobilinogen, UA: 0.2 (ref 0.0–1.0)
pH: 8 (ref 5.0–8.0)

## 2016-02-04 LAB — HEPATIC FUNCTION PANEL
ALT: 13 U/L (ref 0–53)
AST: 18 U/L (ref 0–37)
Albumin: 4.3 g/dL (ref 3.5–5.2)
Alkaline Phosphatase: 39 U/L (ref 39–117)
Bilirubin, Direct: 0.2 mg/dL (ref 0.0–0.3)
Total Bilirubin: 0.7 mg/dL (ref 0.2–1.2)
Total Protein: 7.2 g/dL (ref 6.0–8.3)

## 2016-02-04 LAB — BASIC METABOLIC PANEL
BUN: 15 mg/dL (ref 6–23)
CHLORIDE: 105 meq/L (ref 96–112)
CO2: 29 mEq/L (ref 19–32)
Calcium: 9.5 mg/dL (ref 8.4–10.5)
Creatinine, Ser: 1.08 mg/dL (ref 0.40–1.50)
GFR: 69.74 mL/min (ref >60.00)
GLUCOSE: 103 mg/dL — AB (ref 70–99)
POTASSIUM: 4.7 meq/L (ref 3.5–5.1)
SODIUM: 142 meq/L (ref 135–145)

## 2016-02-04 LAB — LIPID PANEL
CHOL/HDL RATIO: 3
Cholesterol: 121 mg/dL (ref 0–200)
HDL: 39.4 mg/dL (ref >39.00)
LDL CALC: 67 mg/dL (ref 0–99)
NONHDL: 81.7
TRIGLYCERIDES: 76 mg/dL (ref 0.0–149.0)
VLDL: 15.2 mg/dL (ref 0.0–40.0)

## 2016-02-04 LAB — TSH: TSH: 3.53 u[IU]/mL (ref 0.35–4.50)

## 2016-02-04 MED ORDER — DIAZEPAM 5 MG PO TABS: 5.0000 mg | ORAL_TABLET | Freq: Two times a day (BID) | ORAL | 1 refills | Status: DC

## 2016-02-04 MED ORDER — TERAZOSIN HCL 5 MG PO CAPS: 5.0000 mg | ORAL_CAPSULE | Freq: Every day | ORAL | 3 refills | Status: DC

## 2016-02-04 MED ORDER — NITROGLYCERIN 0.4 MG SL SUBL: 0.4000 mg | SUBLINGUAL_TABLET | SUBLINGUAL | 2 refills | Status: DC | PRN

## 2016-02-04 MED ORDER — SIMVASTATIN 20 MG PO TABS: 20.0000 mg | ORAL_TABLET | Freq: Every day | ORAL | 3 refills | Status: DC

## 2016-02-04 MED ORDER — FINASTERIDE 5 MG PO TABS: 5.0000 mg | ORAL_TABLET | Freq: Every day | ORAL | 3 refills | Status: DC

## 2016-02-04 MED ORDER — VOLTAREN 1 % TD GEL: 1.0000 "application " | Freq: Two times a day (BID) | TRANSDERMAL | 3 refills | Status: DC

## 2016-02-04 NOTE — Assessment & Plan Note (Signed)
F/u Dr Wynelle Link TKR right - August '11

## 2016-02-04 NOTE — Assessment & Plan Note (Signed)
Coumadin Simvastatin

## 2016-02-04 NOTE — Progress Notes (Signed)
Subjective:  Patient ID: Bernard [name], male    DOB: Mar 28, 1932  Age: 82 y.o. MRN: [id number]  CC: No chief complaint on file.   HPI Bernard [name] presents for LBP, BPH, dyslipidemia f/u C/o black dots and strings in the urine x 2 wks - better  Outpatient Medications Prior to Visit  Medication Sig Dispense Refill  . cholecalciferol (VITAMIN D) 1000 UNITS tablet Take 1,000 Units by mouth daily.    . diazepam (VALIUM) 5 MG tablet Take 1 tablet (5 mg total) by mouth 2 (two) times daily. 180 tablet 1  . finasteride (PROSCAR) 5 MG tablet Take 5 mg by mouth daily.    [name] isometheptene-acetaminophen-dichloralphenazone (MIDRIN) 65-100-325 MG capsule Take 1 capsule by mouth 4 (four) times daily as needed for migraine or headache. Maximum 5 capsules in 12 hours for migraine headaches, 8 capsules in 24 hours for tension headaches.    . multivitamin-lutein (OCUVITE-LUTEIN) CAPS Take 1 capsule by mouth daily.    . nitroGLYCERIN (NITROSTAT) 0.4 MG SL tablet Place 0.4 mg under the tongue every 5 (five) minutes as needed for chest pain (3 DOSES MAX).    [name] omeprazole (PRILOSEC) 20 MG capsule Take 20 mg by mouth daily as needed (heartburn or acid reflux).    . simvastatin (ZOCOR) 20 MG tablet Take 20 mg by mouth daily.    [name] terazosin (HYTRIN) 5 MG capsule Take 5 mg by mouth at bedtime.    [name] warfarin (COUMADIN) 5 MG tablet Take 5 mg by mouth as directed.     No facility-administered medications prior to visit.     ROS Review of Systems  Constitutional: Negative for appetite change, fatigue and unexpected weight change.  HENT: Negative for congestion, nosebleeds, sneezing, sore throat and trouble swallowing.   Eyes: Negative for itching and visual disturbance.  Respiratory: Negative for cough.   Cardiovascular: Negative for chest pain, palpitations and leg swelling.  Gastrointestinal: Negative for abdominal distention, blood in stool, diarrhea and nausea.  Genitourinary: Positive for hematuria.  Negative for decreased urine volume and frequency.  Musculoskeletal: Negative for back pain, gait problem, joint swelling and neck pain.  Skin: Negative for rash.  Neurological: Negative for dizziness, tremors, speech difficulty and weakness.  Psychiatric/Behavioral: Negative for agitation, dysphoric mood and sleep disturbance. The patient is not nervous/anxious.     Objective:  BP 120/84   Pulse 76   Wt 212 lb (96.2 kg)   SpO2 95%   BMI 30.42 kg/m   BP Readings from Last 3 Encounters:  02/04/16 120/84  10/23/15 130/82  09/11/15 132/67    Wt Readings from Last 3 Encounters:  02/04/16 212 lb (96.2 kg)  10/23/15 211 lb 3.2 oz (95.8 kg)  09/11/15 211 lb 9.6 oz (96 kg)    Physical Exam  Constitutional: He is oriented to person, place, and time. He appears well-developed. No distress.  NAD  HENT:  Mouth/Throat: Oropharynx is clear and moist.  Eyes: Conjunctivae are normal. Pupils are equal, round, and reactive to light.  Neck: Normal range of motion. No JVD present. No thyromegaly present.  Cardiovascular: Normal rate, regular rhythm, normal heart sounds and intact distal pulses.  Exam reveals no gallop and no friction rub.   No murmur heard. Pulmonary/Chest: Effort normal and breath sounds normal. No respiratory distress. He has no wheezes. He has no rales. He exhibits no tenderness.  Abdominal: Soft. Bowel sounds are normal. He exhibits no distension and no mass. There is no tenderness. There is no  rebound and no guarding.  Musculoskeletal: Normal range of motion. He exhibits no edema or tenderness.  Lymphadenopathy:    He has no cervical adenopathy.  Neurological: He is alert and oriented to person, place, and time. He has normal reflexes. No cranial nerve deficit. He exhibits normal muscle tone. He displays a negative Romberg sign. Coordination abnormal. Gait normal.  Skin: Skin is warm and dry. No rash noted.  Psychiatric: He has a normal mood and affect. His behavior is  normal. Judgment and thought content normal.    Lab Results  Component Value Date   WBC 7.2 09/05/2015   HGB 13.5 09/05/2015   HCT 40.6 09/05/2015   PLT 142 09/05/2015   GLUCOSE 92 09/05/2015   CHOL 114 02/01/2015   TRIG 60.0 02/01/2015   HDL 37.40 (L) 02/01/2015   LDLCALC 65 02/01/2015   ALT 17 09/05/2015   AST 23 09/05/2015   NA 144 09/05/2015   K 4.3 09/05/2015   CL 106 08/06/2015   CREATININE 1.1 09/05/2015   BUN 19.3 09/05/2015   CO2 26 09/05/2015   TSH 1.69 02/01/2015   PSA 2.06 04/22/2009   INR 3.0 01/10/2016   HGBA1C (H) 10/23/2009    6.0 (NOTE)                                                                       According to the ADA Clinical Practice Recommendations for 2011, when HbA1c is used as a screening test:   >=6.5%   Diagnostic of Diabetes Mellitus           (if abnormal result  is confirmed)  5.7-6.4%   Increased risk of developing Diabetes Mellitus  References:Diagnosis and Classification of Diabetes Mellitus,Diabetes ZHYQ,6578,46(NGEXB 1):S62-S69 and Standards of Medical Care in         Diabetes - 2011,Diabetes Care,2011,34  (Suppl 1):S11-S61.    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 Restaging. EXAM: CT CHEST WITH CONTRAST TECHNIQUE: Multidetector CT imaging of the chest was performed during intravenous contrast administration. CONTRAST:  43m ISOVUE-300 IOPAMIDOL (ISOVUE-300) INJECTION 61% COMPARISON:  09/07/2014 chest CT. FINDINGS: Mediastinum/Nodes: Normal heart size. No significant pericardial fluid/thickening. Left anterior descending and right coronary atherosclerosis. Atherosclerotic nonaneurysmal thoracic aorta. Normal caliber pulmonary arteries. No central pulmonary emboli. No discrete thyroid nodules. Unremarkable esophagus. No pathologically enlarged axillary, mediastinal or hilar lymph nodes. Lungs/Pleura: No pneumothorax. No pleural effusion.  Right middle lobectomy. Stable mild scarring in the right lung adjacent to the lobectomy site. Subpleural right upper lobe 4 mm calcified granuloma. No acute consolidative airspace disease, significant pulmonary nodules or lung masses. Upper abdomen: Small hiatal hernia. Possible tiny layering gallstones in the gallbladder. No gallbladder wall thickening. Simple 8.2 cm renal cyst in the posterior upper right kidney. Stable granulomatous calcifications in the normal size spleen. Musculoskeletal: No aggressive appearing focal osseous lesions. Mild-to-moderate thoracic spondylosis. IMPRESSION: 1. Right middle lobectomy. No evidence of local tumor recurrence in the right lung. 2. No evidence of metastatic disease in the chest. 3. Additional findings include 2 vessel coronary atherosclerosis, aortic atherosclerosis, small hiatal hernia and possible cholelithiasis. Electronically Signed   By: JIlona SorrelM.D.   On:  09/05/2015 16:00    Assessment & Plan:   There are no diagnoses linked to this encounter. I am having Mr. Jasso maintain his multivitamin-lutein, diazepam, omeprazole, cholecalciferol, isometheptene-acetaminophen-dichloralphenazone, nitroGLYCERIN, simvastatin, terazosin, warfarin, finasteride, and VOLTAREN.  Meds ordered this encounter  Medications  . VOLTAREN 1 % GEL    Sig: Place 1 application onto the skin 2 (two) times daily.     Follow-up: No Follow-up on file.  [name], MD

## 2016-02-04 NOTE — Progress Notes (Signed)
Pre visit review using our clinic review tool, if applicable. No additional management support is needed unless otherwise documented below in the visit note. 

## 2016-02-04 NOTE — Assessment & Plan Note (Signed)
On Terazosin 

## 2016-02-04 NOTE — Assessment & Plan Note (Signed)
clots in urine - new Urol ref Labs

## 2016-02-07 ENCOUNTER — Ambulatory Visit (INDEPENDENT_AMBULATORY_CARE_PROVIDER_SITE_OTHER): Payer: MEDICARE | Admitting: General Practice

## 2016-02-07 DIAGNOSIS — G459 Transient cerebral ischemic attack, unspecified: Secondary | ICD-10-CM | POA: Diagnosis not present

## 2016-02-07 DIAGNOSIS — I4891 Unspecified atrial fibrillation: Secondary | ICD-10-CM

## 2016-02-07 DIAGNOSIS — Z5181 Encounter for therapeutic drug level monitoring: Secondary | ICD-10-CM

## 2016-02-07 LAB — POCT INR: INR: 2.7

## 2016-02-07 NOTE — Patient Instructions (Signed)
Pre visit review using our clinic review tool, if applicable. No additional management support is needed unless otherwise documented below in the visit note. 

## 2016-02-09 NOTE — Progress Notes (Signed)
I have reviewed and agree with the plan. 

## 2016-02-21 ENCOUNTER — Telehealth: Payer: Self-pay | Admitting: Internal Medicine

## 2016-02-21 NOTE — Telephone Encounter (Signed)
Spoke with patient in regards to annual wellness visit. Patient stated that his ins does not pay for annual wellness visits.

## 2016-03-06 ENCOUNTER — Ambulatory Visit (INDEPENDENT_AMBULATORY_CARE_PROVIDER_SITE_OTHER): Payer: MEDICARE | Admitting: General Practice

## 2016-03-06 DIAGNOSIS — I4891 Unspecified atrial fibrillation: Secondary | ICD-10-CM

## 2016-03-06 DIAGNOSIS — G458 Other transient cerebral ischemic attacks and related syndromes: Secondary | ICD-10-CM

## 2016-03-06 DIAGNOSIS — Z5181 Encounter for therapeutic drug level monitoring: Secondary | ICD-10-CM

## 2016-03-06 DIAGNOSIS — G459 Transient cerebral ischemic attack, unspecified: Secondary | ICD-10-CM

## 2016-03-06 LAB — POCT INR: INR: 3.3

## 2016-03-06 NOTE — Patient Instructions (Signed)
Pre visit review using our clinic review tool, if applicable. No additional management support is needed unless otherwise documented below in the visit note. 

## 2016-03-07 NOTE — Progress Notes (Signed)
I have reviewed and agree with the plan. 

## 2016-04-03 ENCOUNTER — Ambulatory Visit (INDEPENDENT_AMBULATORY_CARE_PROVIDER_SITE_OTHER): Payer: MEDICARE | Admitting: General Practice

## 2016-04-03 DIAGNOSIS — Z5181 Encounter for therapeutic drug level monitoring: Secondary | ICD-10-CM

## 2016-04-03 DIAGNOSIS — G459 Transient cerebral ischemic attack, unspecified: Secondary | ICD-10-CM

## 2016-04-03 DIAGNOSIS — I4891 Unspecified atrial fibrillation: Secondary | ICD-10-CM | POA: Diagnosis not present

## 2016-04-03 LAB — POCT INR: INR: 2.5

## 2016-04-03 NOTE — Patient Instructions (Signed)
Pre visit review using our clinic review tool, if applicable. No additional management support is needed unless otherwise documented below in the visit note. 

## 2016-04-03 NOTE — Progress Notes (Signed)
I have reviewed and agree with the plan. 

## 2016-05-01 ENCOUNTER — Ambulatory Visit (INDEPENDENT_AMBULATORY_CARE_PROVIDER_SITE_OTHER): Payer: MEDICARE | Admitting: General Practice

## 2016-05-01 DIAGNOSIS — Z5181 Encounter for therapeutic drug level monitoring: Secondary | ICD-10-CM

## 2016-05-01 DIAGNOSIS — G459 Transient cerebral ischemic attack, unspecified: Secondary | ICD-10-CM | POA: Diagnosis not present

## 2016-05-01 DIAGNOSIS — I4891 Unspecified atrial fibrillation: Secondary | ICD-10-CM

## 2016-05-01 LAB — POCT INR: INR: 2.6

## 2016-05-01 NOTE — Progress Notes (Signed)
I have reviewed and agree with the plan. 

## 2016-06-01 ENCOUNTER — Telehealth: Payer: Self-pay | Admitting: Internal Medicine

## 2016-06-01 NOTE — Telephone Encounter (Signed)
Spoke with patient regarding awv. Patient stated that he is not interested in an annual wellness visit. Declined to schedule appt.

## 2016-06-03 ENCOUNTER — Ambulatory Visit (INDEPENDENT_AMBULATORY_CARE_PROVIDER_SITE_OTHER): Payer: MEDICARE | Admitting: General Practice

## 2016-06-03 ENCOUNTER — Other Ambulatory Visit: Payer: Self-pay | Admitting: General Practice

## 2016-06-03 DIAGNOSIS — Z5181 Encounter for therapeutic drug level monitoring: Secondary | ICD-10-CM

## 2016-06-03 DIAGNOSIS — I481 Persistent atrial fibrillation: Secondary | ICD-10-CM

## 2016-06-03 DIAGNOSIS — I4819 Other persistent atrial fibrillation: Secondary | ICD-10-CM

## 2016-06-03 LAB — POCT INR: INR: 3.2

## 2016-06-03 MED ORDER — WARFARIN SODIUM 5 MG PO TABS: ORAL_TABLET | ORAL | 1 refills | Status: DC

## 2016-06-03 NOTE — Patient Instructions (Signed)
Pre visit review using our clinic review tool, if applicable. No additional management support is needed unless otherwise documented below in the visit note. 

## 2016-06-03 NOTE — Progress Notes (Signed)
I have reviewed and agree with the plan. 

## 2016-06-11 ENCOUNTER — Telehealth: Payer: Self-pay

## 2016-06-11 NOTE — Telephone Encounter (Signed)
Cologuard ordered, order # [id number].  Pt notified.

## 2016-06-23 LAB — COLOGUARD: Cologuard: NEGATIVE

## 2016-07-01 ENCOUNTER — Ambulatory Visit (INDEPENDENT_AMBULATORY_CARE_PROVIDER_SITE_OTHER): Payer: MEDICARE | Admitting: General Practice

## 2016-07-01 DIAGNOSIS — Z5181 Encounter for therapeutic drug level monitoring: Secondary | ICD-10-CM | POA: Diagnosis not present

## 2016-07-01 DIAGNOSIS — I4891 Unspecified atrial fibrillation: Secondary | ICD-10-CM

## 2016-07-01 LAB — POCT INR: INR: 2.9

## 2016-07-01 NOTE — Patient Instructions (Signed)
Pre visit review using our clinic review tool, if applicable. No additional management support is needed unless otherwise documented below in the visit note. 

## 2016-07-01 NOTE — Progress Notes (Signed)
I have reviewed and agree with the plan. 

## 2016-07-09 NOTE — Telephone Encounter (Signed)
test came back negative

## 2016-07-31 ENCOUNTER — Ambulatory Visit (INDEPENDENT_AMBULATORY_CARE_PROVIDER_SITE_OTHER): Payer: MEDICARE | Admitting: General Practice

## 2016-07-31 DIAGNOSIS — Z5181 Encounter for therapeutic drug level monitoring: Secondary | ICD-10-CM | POA: Diagnosis not present

## 2016-07-31 DIAGNOSIS — I4891 Unspecified atrial fibrillation: Secondary | ICD-10-CM

## 2016-07-31 LAB — POCT INR: INR: 4

## 2016-07-31 NOTE — Progress Notes (Signed)
I have reviewed and agree with the plan. 

## 2016-07-31 NOTE — Patient Instructions (Signed)
Pre visit review using our clinic review tool, if applicable. No additional management support is needed unless otherwise documented below in the visit note. 

## 2016-08-05 ENCOUNTER — Encounter: Payer: Self-pay | Admitting: Internal Medicine

## 2016-08-05 ENCOUNTER — Ambulatory Visit (INDEPENDENT_AMBULATORY_CARE_PROVIDER_SITE_OTHER): Payer: MEDICARE | Admitting: Internal Medicine

## 2016-08-05 ENCOUNTER — Other Ambulatory Visit (INDEPENDENT_AMBULATORY_CARE_PROVIDER_SITE_OTHER): Payer: MEDICARE

## 2016-08-05 VITALS — BP 116/62 | HR 77 | Temp 97.8°F | Ht 70.0 in | Wt 213.0 lb

## 2016-08-05 DIAGNOSIS — C3491 Malignant neoplasm of unspecified part of right bronchus or lung: Secondary | ICD-10-CM

## 2016-08-05 DIAGNOSIS — K2100 Gastro-esophageal reflux disease with esophagitis, without bleeding: Secondary | ICD-10-CM

## 2016-08-05 DIAGNOSIS — E785 Hyperlipidemia, unspecified: Secondary | ICD-10-CM | POA: Diagnosis not present

## 2016-08-05 DIAGNOSIS — G8929 Other chronic pain: Secondary | ICD-10-CM

## 2016-08-05 DIAGNOSIS — M25521 Pain in right elbow: Secondary | ICD-10-CM

## 2016-08-05 LAB — BASIC METABOLIC PANEL
BUN: 21 mg/dL (ref 6–23)
CHLORIDE: 107 meq/L (ref 96–112)
CO2: 28 mEq/L (ref 19–32)
Calcium: 9.1 mg/dL (ref 8.4–10.5)
Creatinine, Ser: 1.07 mg/dL (ref 0.40–1.50)
GFR: 70.4 mL/min (ref >60.00)
Glucose, Bld: 105 mg/dL — ABNORMAL HIGH (ref 70–99)
Potassium: 4.2 mEq/L (ref 3.5–5.1)
SODIUM: 140 meq/L (ref 135–145)

## 2016-08-05 LAB — HEPATIC FUNCTION PANEL
ALBUMIN: 4.2 g/dL (ref 3.5–5.2)
ALK PHOS: 38 U/L — AB (ref 39–117)
ALT: 19 U/L (ref 0–53)
AST: 20 U/L (ref 0–37)
Bilirubin, Direct: 0.2 mg/dL (ref 0.0–0.3)
TOTAL PROTEIN: 6.9 g/dL (ref 6.0–8.3)
Total Bilirubin: 0.8 mg/dL (ref 0.2–1.2)

## 2016-08-05 LAB — LIPID PANEL
CHOL/HDL RATIO: 4
Cholesterol: 130 mg/dL (ref 0–200)
HDL: 37.2 mg/dL — AB (ref >39.00)
LDL CALC: 74 mg/dL (ref 0–99)
NONHDL: 93.08
TRIGLYCERIDES: 97 mg/dL (ref 0.0–149.0)
VLDL: 19.4 mg/dL (ref 0.0–40.0)

## 2016-08-05 MED ORDER — METHYLPREDNISOLONE ACETATE 40 MG/ML IJ SUSP
20.0000 mg | Freq: Once | INTRAMUSCULAR | Status: AC
  Administered 2016-08-05: 20 mg via INTRA_ARTICULAR

## 2016-08-05 NOTE — Assessment & Plan Note (Signed)
On [facility]oc[facility]

## 2016-08-05 NOTE — Assessment & Plan Note (Signed)
Omeprazole  Potential benefits of a long term PPI use as well as potential risks  and complications were explained to the patient and were aknowledged.

## 2016-08-05 NOTE — Assessment & Plan Note (Signed)
IA non-small cell lung cancer status post resection and has been observation since 2010 with no evidence for disease recurrence.  The recent CT scan of the chest showed no evidence for disease recurrence. I discussed the scan results with the patient and his wife.  I recommended for the patient to continue on observation with routine follow-up visit with his primary care physician. He was advised to call immediately if he has any concerning symptoms in the interval.

## 2016-08-05 NOTE — Addendum Note (Signed)
Addended by: Karren Cobble on: 08/05/2016 01:02 PM   Modules accepted: Orders

## 2016-08-05 NOTE — Assessment & Plan Note (Signed)
See procedure 

## 2016-08-05 NOTE — Patient Instructions (Signed)
[facility] 

## 2016-08-05 NOTE — Progress Notes (Signed)
Subjective:  Patient ID: Bernard [name], male    DOB: Mar 22, 1932  Age: 83 y.o. MRN: [id number]  CC: No chief complaint on file.   HPI Bernard [name] presents for BPH, GERD, dyslipidemia, CAD f/u  Outpatient Medications Prior to Visit  Medication Sig Dispense Refill  . cholecalciferol (VITAMIN D) 1000 UNITS tablet Take 1,000 Units by mouth daily.    . diazepam (VALIUM) 5 MG tablet Take 1 tablet (5 mg total) by mouth 2 (two) times daily. 180 tablet 1  . finasteride (PROSCAR) 5 MG tablet Take 1 tablet (5 mg total) by mouth daily. 90 tablet 3  . isometheptene-acetaminophen-dichloralphenazone (MIDRIN) 65-100-325 MG capsule Take 1 capsule by mouth 4 (four) times daily as needed for migraine or headache. Maximum 5 capsules in 12 hours for migraine headaches, 8 capsules in 24 hours for tension headaches.    . multivitamin-lutein (OCUVITE-LUTEIN) CAPS Take 1 capsule by mouth daily.    . nitroGLYCERIN (NITROSTAT) 0.4 MG SL tablet Place 1 tablet (0.4 mg total) under the tongue every 5 (five) minutes as needed for chest pain (3 DOSES MAX). 25 tablet 2  . omeprazole (PRILOSEC) 20 MG capsule Take 20 mg by mouth daily as needed (heartburn or acid reflux).    . simvastatin (ZOCOR) 20 MG tablet Take 1 tablet (20 mg total) by mouth daily. 90 tablet 3  . terazosin (HYTRIN) 5 MG capsule Take 1 capsule (5 mg total) by mouth at bedtime. 90 capsule 3  . VOLTAREN 1 % GEL Apply 1 application topically 2 (two) times daily. 300 g 3  . warfarin (COUMADIN) 5 MG tablet Take as directed by anticoagulation clinic. 90 tablet 1   No facility-administered medications prior to visit.     ROS Review of Systems  Constitutional: Negative for appetite change, fatigue and unexpected weight change.  HENT: Negative for congestion, nosebleeds, sneezing, sore throat and trouble swallowing.   Eyes: Negative for itching and visual disturbance.  Respiratory: Negative for cough.   Cardiovascular: Negative for chest pain,  palpitations and leg swelling.  Gastrointestinal: Negative for abdominal distention, blood in stool, diarrhea and nausea.  Genitourinary: Negative for frequency and hematuria.  Musculoskeletal: Positive for arthralgias. Negative for back pain, gait problem, joint swelling and neck pain.  Skin: Negative for rash.  Neurological: Negative for dizziness, tremors, speech difficulty and weakness.  Psychiatric/Behavioral: Negative for agitation, dysphoric mood, sleep disturbance and suicidal ideas. The patient is not nervous/anxious.     Objective:  BP 116/62 (BP Location: Left Arm, Patient Position: Sitting, Cuff Size: Large)   Pulse 77   Temp 97.8 F (36.6 C) (Oral)   Ht 5\' 10"  (1.778 m)   Wt 213 lb (96.6 kg)   SpO2 98%   BMI 30.56 kg/m   BP Readings from Last 3 Encounters:  08/05/16 116/62  02/04/16 120/84  10/23/15 130/82    Wt Readings from Last 3 Encounters:  08/05/16 213 lb (96.6 kg)  02/04/16 212 lb (96.2 kg)  10/23/15 211 lb 3.2 oz (95.8 kg)    Physical Exam  Constitutional: He is oriented to person, place, and time. He appears well-developed. No distress.  NAD  HENT:  Mouth/Throat: Oropharynx is clear and moist.  Eyes: Conjunctivae are normal. Pupils are equal, round, and reactive to light.  Neck: Normal range of motion. No JVD present. No thyromegaly present.  Cardiovascular: Normal rate, regular rhythm, normal heart sounds and intact distal pulses.  Exam reveals no gallop and no friction rub.   No murmur  heard. Pulmonary/Chest: Effort normal and breath sounds normal. No respiratory distress. He has no wheezes. He has no rales. He exhibits no tenderness.  Abdominal: Soft. Bowel sounds are normal. He exhibits no distension and no mass. There is no tenderness. There is no rebound and no guarding.  Musculoskeletal: Normal range of motion. He exhibits no edema or tenderness.  Lymphadenopathy:    He has no cervical adenopathy.  Neurological: He is alert and oriented to  person, place, and time. He has normal reflexes. No cranial nerve deficit. He exhibits normal muscle tone. He displays a negative Romberg sign. Coordination and gait normal.  Skin: Skin is warm and dry. No rash noted.  Psychiatric: He has a normal mood and affect. His behavior is normal. Judgment and thought content normal.  R lat elbow is tender  Procedure: tennis elbow steroid injection  Indication: R elbow lateral epicondylitis  Risks including bleeding, infection, unsuccessful procedure and others were explained to the patient in detail. The pt agreed to proceed. The patient was placed in the decubitus position. The area was prepped with betadine and alcohol. The injection was carried out with a mixture of 20 mg of Depomedrol and 1 cc of Lidocaine 2%. Band-Aid applied.  Tolerated well. Complications - none. Post-procedure instructions were provided.     Lab Results  Component Value Date   WBC 7.2 09/05/2015   HGB 13.5 09/05/2015   HCT 40.6 09/05/2015   PLT 142 09/05/2015   GLUCOSE 103 (H) 02/04/2016   CHOL 121 02/04/2016   TRIG 76.0 02/04/2016   HDL 39.40 02/04/2016   LDLCALC 67 02/04/2016   ALT 13 02/04/2016   AST 18 02/04/2016   NA 142 02/04/2016   K 4.7 02/04/2016   CL 105 02/04/2016   CREATININE 1.08 02/04/2016   BUN 15 02/04/2016   CO2 29 02/04/2016   TSH 3.53 02/04/2016   PSA 2.06 04/22/2009   INR 4.0 07/31/2016   HGBA1C (H) 10/23/2009    6.0 (NOTE)                                                                       According to the ADA Clinical Practice Recommendations for 2011, when HbA1c is used as a screening test:   >=6.5%   Diagnostic of Diabetes Mellitus           (if abnormal result  is confirmed)  5.7-6.4%   Increased risk of developing Diabetes Mellitus  References:Diagnosis and Classification of Diabetes Mellitus,Diabetes KDXI,3382,50(NLZJQ 1):S62-S69 and Standards of Medical Care in         Diabetes - 2011,Diabetes Care,2011,34  (Suppl 1):S11-S61.      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 Restaging. EXAM: CT CHEST WITH CONTRAST TECHNIQUE: Multidetector CT imaging of the chest was performed during intravenous contrast administration. CONTRAST:  44mL ISOVUE-300 IOPAMIDOL (ISOVUE-300) INJECTION 61% COMPARISON:  09/07/2014 chest CT. FINDINGS: Mediastinum/Nodes: Normal heart size. No significant pericardial fluid/thickening. Left anterior descending and right coronary atherosclerosis. Atherosclerotic nonaneurysmal thoracic aorta. Normal caliber pulmonary arteries. No central pulmonary emboli. No discrete thyroid nodules. Unremarkable esophagus. No pathologically enlarged axillary, mediastinal or hilar lymph nodes. Lungs/Pleura: No pneumothorax. No pleural  effusion. Right middle lobectomy. Stable mild scarring in the right lung adjacent to the lobectomy site. Subpleural right upper lobe 4 mm calcified granuloma. No acute consolidative airspace disease, significant pulmonary nodules or lung masses. Upper abdomen: Small hiatal hernia. Possible tiny layering gallstones in the gallbladder. No gallbladder wall thickening. Simple 8.2 cm renal cyst in the posterior upper right kidney. Stable granulomatous calcifications in the normal size spleen. Musculoskeletal: No aggressive appearing focal osseous lesions. Mild-to-moderate thoracic spondylosis. IMPRESSION: 1. Right middle lobectomy. No evidence of local tumor recurrence in the right lung. 2. No evidence of metastatic disease in the chest. 3. Additional findings include 2 vessel coronary atherosclerosis, aortic atherosclerosis, small hiatal hernia and possible cholelithiasis. Electronically Signed   By: Ilona Sorrel M.D.   On: 09/05/2015 16:00    Assessment & Plan:   There are no diagnoses linked to this encounter. I am having Mr. Demilio maintain his multivitamin-lutein, omeprazole, cholecalciferol,  isometheptene-acetaminophen-dichloralphenazone, diazepam, finasteride, nitroGLYCERIN, simvastatin, terazosin, VOLTAREN, and warfarin.  No orders of the defined types were placed in this encounter.    Follow-up: No Follow-up on file.  Bernard Kehr, MD

## 2016-08-28 ENCOUNTER — Ambulatory Visit (INDEPENDENT_AMBULATORY_CARE_PROVIDER_SITE_OTHER): Payer: MEDICARE | Admitting: General Practice

## 2016-08-28 DIAGNOSIS — Z5181 Encounter for therapeutic drug level monitoring: Secondary | ICD-10-CM | POA: Diagnosis not present

## 2016-08-28 DIAGNOSIS — I4891 Unspecified atrial fibrillation: Secondary | ICD-10-CM

## 2016-08-28 LAB — POCT INR: INR: 3.2

## 2016-08-28 NOTE — Patient Instructions (Signed)
Pre visit review using our clinic review tool, if applicable. No additional management support is needed unless otherwise documented below in the visit note. 

## 2016-09-11 ENCOUNTER — Ambulatory Visit (INDEPENDENT_AMBULATORY_CARE_PROVIDER_SITE_OTHER)
Admission: RE | Admit: 2016-09-11 | Discharge: 2016-09-11 | Disposition: A | Discharge status: Home / Self Care | Payer: MEDICARE | Source: Ambulatory Visit | Attending: Internal Medicine | Admitting: Internal Medicine

## 2016-09-11 DIAGNOSIS — C3491 Malignant neoplasm of unspecified part of right bronchus or lung: Secondary | ICD-10-CM

## 2016-09-25 ENCOUNTER — Ambulatory Visit (INDEPENDENT_AMBULATORY_CARE_PROVIDER_SITE_OTHER): Payer: MEDICARE | Admitting: General Practice

## 2016-09-25 DIAGNOSIS — Z5181 Encounter for therapeutic drug level monitoring: Secondary | ICD-10-CM

## 2016-09-25 DIAGNOSIS — I4891 Unspecified atrial fibrillation: Secondary | ICD-10-CM | POA: Diagnosis not present

## 2016-09-25 LAB — POCT INR: INR: 2.3

## 2016-09-25 NOTE — Progress Notes (Signed)
I have reviewed and agree with the plan. 

## 2016-09-25 NOTE — Patient Instructions (Signed)
Pre visit review using our clinic review tool, if applicable. No additional management support is needed unless otherwise documented below in the visit note. 

## 2016-09-28 ENCOUNTER — Encounter: Payer: Self-pay | Admitting: Internal Medicine

## 2016-10-14 ENCOUNTER — Encounter: Payer: Self-pay | Admitting: Internal Medicine

## 2016-10-14 ENCOUNTER — Ambulatory Visit (INDEPENDENT_AMBULATORY_CARE_PROVIDER_SITE_OTHER): Payer: MEDICARE | Admitting: Internal Medicine

## 2016-10-14 VITALS — BP 130/76 | HR 79 | Ht 70.0 in | Wt 212.0 lb

## 2016-10-14 DIAGNOSIS — I481 Persistent atrial fibrillation: Secondary | ICD-10-CM | POA: Diagnosis not present

## 2016-10-14 DIAGNOSIS — R0602 Shortness of breath: Secondary | ICD-10-CM

## 2016-10-14 DIAGNOSIS — I4819 Other persistent atrial fibrillation: Secondary | ICD-10-CM

## 2016-10-14 NOTE — Patient Instructions (Signed)
Medication Instructions:  NO CHANGES IN MEDICATIONS  Follow-Up: Your physician wants you to follow-up in: [place], NP.  You will receive a reminder letter in the mail two months in advance. If you don't receive a letter, please call our office to schedule the follow-up appointment.  Any Other Special Instructions Will Be Listed Below (If Applicable).     If you need a refill on your cardiac medications before your next appointment, please call your pharmacy.

## 2016-10-14 NOTE — Progress Notes (Signed)
PCP: Plotnikov, Evie Lacks, MD  Primary EP: Dr Rayann Heman  [name] is a 83 y.o. male who presents today for routine electrophysiology followup.  Since last being seen in our clinic, the patient reports doing very well.  His primary limitation is with chronic knee pain.  He is s/p bilateral knee surgery.  He has SOB with inclines but does reasonably well on level ground.  He tries to walk a mile a day when his knees allow.  Today, he denies symptoms of palpitations, chest pain,  lower extremity edema, dizziness, presyncope, or syncope.  The patient is otherwise without complaint today.   Past Medical History:  Diagnosis Date  . CAD (coronary artery disease)    s/p MI 1976, normal myoview 7/11  . Depressive disorder, not elsewhere classified   . Esophageal reflux   . Headache    migraines occ., none in months.  . History of BPH    benign prostatic hypertrophy  . Labyrinthitis, unspecified   . Malignant neoplasm middle lobe, bronchus or lung    right lung. Dx 07/27/08, sp/p resection 7/10.   . Osteoarthrosis, unspecified whether generalized or localized, lower leg    knee  . Other and unspecified hyperlipidemia   . Paroxysmal atrial fibrillation (HCC)    postoperative after lung surgery  . Renal cyst, right    being followed  . Stroke [facility])    TIA March 2000     no side effects now  . Unspecified essential hypertension    Past Surgical History:  Procedure Laterality Date  . anthroscopic surgery  '10   right knee \  . BUNIONECTOMY  '89   left foot  . CATARACT EXTRACTION, BILATERAL    . ESOPHAGOGASTRODUODENOSCOPY  05/14/04  . EYE SURGERY     both eyes - 2011 [facility])  . INGUINAL HERNIA REPAIR  '71   left  . right TKR  8/11  . TOTAL KNEE ARTHROPLASTY Left 09/24/2014   Procedure: LEFT TOTAL KNEE ARTHROPLASTY;  Surgeon: Gaynelle Arabian, MD;  Location: [facility];  Service: Orthopedics;  Laterality: Left;  [name] VAT-RML lobectomy      ROS- all systems are reviewed and negatives except  as per HPI above  Current Outpatient Prescriptions  Medication Sig Dispense Refill  . cholecalciferol (VITAMIN D) 1000 UNITS tablet Take 1,000 Units by mouth daily.    . diazepam (VALIUM) 5 MG tablet Take 1 tablet (5 mg total) by mouth 2 (two) times daily. 180 tablet 1  . finasteride (PROSCAR) 5 MG tablet Take 1 tablet (5 mg total) by mouth daily. 90 tablet 3  . isometheptene-acetaminophen-dichloralphenazone (MIDRIN) 65-100-325 MG capsule Take 1 capsule by mouth 4 (four) times daily as needed for migraine or headache. Maximum 5 capsules in 12 hours for migraine headaches, 8 capsules in 24 hours for tension headaches.    . multivitamin-lutein (OCUVITE-LUTEIN) CAPS Take 1 capsule by mouth daily.    . nitroGLYCERIN (NITROSTAT) 0.4 MG SL tablet Place 1 tablet (0.4 mg total) under the tongue every 5 (five) minutes as needed for chest pain (3 DOSES MAX). 25 tablet 2  . omeprazole (PRILOSEC) 20 MG capsule Take 20 mg by mouth daily as needed (heartburn or acid reflux).    . simvastatin (ZOCOR) 20 MG tablet Take 1 tablet (20 mg total) by mouth daily. 90 tablet 3  . terazosin (HYTRIN) 5 MG capsule Take 1 capsule (5 mg total) by mouth at bedtime. 90 capsule 3  . VOLTAREN 1 % GEL Apply 1  application topically 2 (two) times daily. 300 g 3  . warfarin (COUMADIN) 5 MG tablet Take as directed by anticoagulation clinic. 90 tablet 1   No current facility-administered medications for this visit.     Physical Exam: Vitals:   10/14/16 1136  BP: 130/76  Pulse: 79  Weight: 212 lb (96.2 kg)  Height: 5\' 10"  (1.778 m)    GEN- The patient is elderly appearing, alert and oriented x 3 today.   Head- normocephalic, atraumatic Eyes-  Sclera clear, conjunctiva pink Ears- hearing intact Oropharynx- clear Lungs- Clear to ausculation bilaterally, normal work of breathing Heart- irregular rate and rhythm, no murmurs, rubs or gallops, PMI not laterally displaced GI- soft, NT, ND, + BS Extremities- no clubbing,  cyanosis, or edema  EKG tracing ordered today is personally reviewed and shows afib, V rate 79 bpm  Assessment and Plan:  1. Persistent afib Asymptomatic He is unaware of his afib today Rate controlled chads2vasc score is 6.  Continue coumadin  2. HTN Stable No change required today  3. CAD No ischemic symptoms No changes He has previously declined stress testing but will reconsider if he has worsening SOB or CP.  Return to see EP NP every year  Thompson Grayer MD, [facility] 10/14/2016 12:01 PM

## 2016-10-23 ENCOUNTER — Other Ambulatory Visit: Payer: Self-pay | Admitting: General Practice

## 2016-10-23 ENCOUNTER — Ambulatory Visit (INDEPENDENT_AMBULATORY_CARE_PROVIDER_SITE_OTHER): Payer: MEDICARE | Admitting: General Practice

## 2016-10-23 DIAGNOSIS — Z5181 Encounter for therapeutic drug level monitoring: Secondary | ICD-10-CM | POA: Diagnosis not present

## 2016-10-23 DIAGNOSIS — I4891 Unspecified atrial fibrillation: Secondary | ICD-10-CM

## 2016-10-23 LAB — POCT INR: INR: 2.5

## 2016-10-23 MED ORDER — WARFARIN SODIUM 5 MG PO TABS: ORAL_TABLET | ORAL | 1 refills | Status: DC

## 2016-10-23 NOTE — Patient Instructions (Signed)
Pre visit review using our clinic review tool, if applicable. No additional management support is needed unless otherwise documented below in the visit note. 

## 2016-10-23 NOTE — Progress Notes (Signed)
I have reviewed and agree with the plan. 

## 2016-12-04 ENCOUNTER — Ambulatory Visit (INDEPENDENT_AMBULATORY_CARE_PROVIDER_SITE_OTHER): Payer: MEDICARE | Admitting: General Practice

## 2016-12-04 DIAGNOSIS — Z7901 Long term (current) use of anticoagulants: Secondary | ICD-10-CM | POA: Diagnosis not present

## 2016-12-04 DIAGNOSIS — I4891 Unspecified atrial fibrillation: Secondary | ICD-10-CM | POA: Diagnosis not present

## 2016-12-04 DIAGNOSIS — Z5181 Encounter for therapeutic drug level monitoring: Secondary | ICD-10-CM

## 2016-12-04 DIAGNOSIS — Z23 Encounter for immunization: Secondary | ICD-10-CM

## 2016-12-04 LAB — POCT INR: INR: 2.4

## 2016-12-04 NOTE — Patient Instructions (Signed)
Pre visit review using our clinic review tool, if applicable. No additional management support is needed unless otherwise documented below in the visit note. 

## 2017-01-05 ENCOUNTER — Ambulatory Visit (INDEPENDENT_AMBULATORY_CARE_PROVIDER_SITE_OTHER): Payer: MEDICARE | Admitting: General Practice

## 2017-01-05 DIAGNOSIS — I4891 Unspecified atrial fibrillation: Secondary | ICD-10-CM | POA: Diagnosis not present

## 2017-01-05 DIAGNOSIS — Z7901 Long term (current) use of anticoagulants: Secondary | ICD-10-CM

## 2017-01-05 LAB — POCT INR: INR: 2.3

## 2017-01-05 NOTE — Patient Instructions (Addendum)
Pre visit review using our clinic review tool, if applicable. No additional management support is needed unless otherwise documented below in the visit note.  Take 1/2 tablet daily except for 1 tablet on Mondays.  Re-check in 6 weeks.

## 2017-01-08 ENCOUNTER — Ambulatory Visit: Payer: MEDICARE

## 2017-02-03 ENCOUNTER — Ambulatory Visit (INDEPENDENT_AMBULATORY_CARE_PROVIDER_SITE_OTHER): Payer: MEDICARE | Admitting: Internal Medicine

## 2017-02-03 ENCOUNTER — Encounter: Payer: Self-pay | Admitting: Internal Medicine

## 2017-02-03 ENCOUNTER — Other Ambulatory Visit (INDEPENDENT_AMBULATORY_CARE_PROVIDER_SITE_OTHER): Payer: MEDICARE

## 2017-02-03 DIAGNOSIS — I10 Essential (primary) hypertension: Secondary | ICD-10-CM

## 2017-02-03 DIAGNOSIS — E785 Hyperlipidemia, unspecified: Secondary | ICD-10-CM

## 2017-02-03 LAB — HEPATIC FUNCTION PANEL
ALBUMIN: 4.2 g/dL (ref 3.5–5.2)
ALK PHOS: 36 U/L — AB (ref 39–117)
ALT: 12 U/L (ref 0–53)
AST: 20 U/L (ref 0–37)
Bilirubin, Direct: 0.1 mg/dL (ref 0.0–0.3)
TOTAL PROTEIN: 7.2 g/dL (ref 6.0–8.3)
Total Bilirubin: 0.8 mg/dL (ref 0.2–1.2)

## 2017-02-03 LAB — LIPID PANEL
CHOL/HDL RATIO: 3
Cholesterol: 116 mg/dL (ref 0–200)
HDL: 39.5 mg/dL (ref >39.00)
LDL CALC: 61 mg/dL (ref 0–99)
NONHDL: 76.13
TRIGLYCERIDES: 77 mg/dL (ref 0.0–149.0)
VLDL: 15.4 mg/dL (ref 0.0–40.0)

## 2017-02-03 LAB — BASIC METABOLIC PANEL
BUN: 21 mg/dL (ref 6–23)
CHLORIDE: 106 meq/L (ref 96–112)
CO2: 26 mEq/L (ref 19–32)
Calcium: 9.1 mg/dL (ref 8.4–10.5)
Creatinine, Ser: 1.12 mg/dL (ref 0.40–1.50)
GFR: 66.71 mL/min (ref >60.00)
Glucose, Bld: 94 mg/dL (ref 70–99)
Potassium: 4.4 mEq/L (ref 3.5–5.1)
Sodium: 142 mEq/L (ref 135–145)

## 2017-02-03 LAB — TSH: TSH: 4.14 u[IU]/mL (ref 0.35–4.50)

## 2017-02-03 MED ORDER — VOLTAREN 1 % TD GEL: 1.0000 "application " | Freq: Two times a day (BID) | TRANSDERMAL | 3 refills | Status: DC

## 2017-02-03 MED ORDER — SIMVASTATIN 20 MG PO TABS: 20.0000 mg | ORAL_TABLET | Freq: Every day | ORAL | 3 refills | Status: DC

## 2017-02-03 MED ORDER — TERAZOSIN HCL 5 MG PO CAPS: 5.0000 mg | ORAL_CAPSULE | Freq: Every day | ORAL | 3 refills | Status: DC

## 2017-02-03 MED ORDER — FINASTERIDE 5 MG PO TABS: 5.0000 mg | ORAL_TABLET | Freq: Every day | ORAL | 3 refills | Status: DC

## 2017-02-03 MED ORDER — WARFARIN SODIUM 5 MG PO TABS: ORAL_TABLET | ORAL | 3 refills | Status: DC

## 2017-02-03 MED ORDER — DIAZEPAM 5 MG PO TABS: 5.0000 mg | ORAL_TABLET | Freq: Two times a day (BID) | ORAL | 1 refills | Status: DC

## 2017-02-03 NOTE — Assessment & Plan Note (Signed)
Terazosyn

## 2017-02-03 NOTE — Progress Notes (Signed)
Subjective:  Patient ID: Bernard [name], male    DOB: 1932/10/24  Age: 83 y.o. MRN: [id number]  CC: No chief complaint on file.   HPI Bernard [name] presents for BPH, anxiety, dyslipidemia and DM f/u  Outpatient Medications Prior to Visit  Medication Sig Dispense Refill  . cholecalciferol (VITAMIN D) 1000 UNITS tablet Take 1,000 Units by mouth daily.    . diazepam (VALIUM) 5 MG tablet Take 1 tablet (5 mg total) by mouth 2 (two) times daily. 180 tablet 1  . finasteride (PROSCAR) 5 MG tablet Take 1 tablet (5 mg total) by mouth daily. 90 tablet 3  . isometheptene-acetaminophen-dichloralphenazone (MIDRIN) 65-100-325 MG capsule Take 1 capsule by mouth 4 (four) times daily as needed for migraine or headache. Maximum 5 capsules in 12 hours for migraine headaches, 8 capsules in 24 hours for tension headaches.    . multivitamin-lutein (OCUVITE-LUTEIN) CAPS Take 1 capsule by mouth daily.    . nitroGLYCERIN (NITROSTAT) 0.4 MG SL tablet Place 1 tablet (0.4 mg total) under the tongue every 5 (five) minutes as needed for chest pain (3 DOSES MAX). 25 tablet 2  . omeprazole (PRILOSEC) 20 MG capsule Take 20 mg by mouth daily as needed (heartburn or acid reflux).    . simvastatin (ZOCOR) 20 MG tablet Take 1 tablet (20 mg total) by mouth daily. 90 tablet 3  . terazosin (HYTRIN) 5 MG capsule Take 1 capsule (5 mg total) by mouth at bedtime. 90 capsule 3  . VOLTAREN 1 % GEL Apply 1 application topically 2 (two) times daily. 300 g 3  . warfarin (COUMADIN) 5 MG tablet Take as directed by anticoagulation clinic. 90 tablet 1   No facility-administered medications prior to visit.     ROS Review of Systems  Constitutional: Positive for fatigue. Negative for appetite change and unexpected weight change.  HENT: Negative for congestion, nosebleeds, sneezing, sore throat and trouble swallowing.   Eyes: Negative for itching and visual disturbance.  Respiratory: Negative for cough.   Cardiovascular: Negative for  chest pain, palpitations and leg swelling.  Gastrointestinal: Negative for abdominal distention, blood in stool, diarrhea and nausea.  Genitourinary: Negative for frequency and hematuria.  Musculoskeletal: Positive for arthralgias and gait problem. Negative for back pain, joint swelling and neck pain.  Skin: Negative for rash.  Neurological: Negative for dizziness, tremors, speech difficulty and weakness.  Psychiatric/Behavioral: Negative for agitation, dysphoric mood, sleep disturbance and suicidal ideas. The patient is nervous/anxious.     Objective:  BP 122/78 (BP Location: Right Arm, Patient Position: Sitting, Cuff Size: Large)   Pulse 77   Temp 97.8 F (36.6 C) (Oral)   Ht 5\' 10"  (1.778 m)   Wt 215 lb (97.5 kg)   SpO2 99%   BMI 30.85 kg/m   BP Readings from Last 3 Encounters:  02/03/17 122/78  10/14/16 130/76  08/05/16 116/62    Wt Readings from Last 3 Encounters:  02/03/17 215 lb (97.5 kg)  10/14/16 212 lb (96.2 kg)  08/05/16 213 lb (96.6 kg)    Physical Exam  Constitutional: He is oriented to person, place, and time. He appears well-developed. No distress.  NAD  HENT:  Mouth/Throat: Oropharynx is clear and moist.  Eyes: Conjunctivae are normal. Pupils are equal, round, and reactive to light.  Neck: Normal range of motion. No JVD present. No thyromegaly present.  Cardiovascular: Normal rate, normal heart sounds and intact distal pulses. Exam reveals no gallop and no friction rub.  No murmur heard. Pulmonary/Chest: Effort  normal and breath sounds normal. No respiratory distress. He has no wheezes. He has no rales. He exhibits no tenderness.  Abdominal: Soft. Bowel sounds are normal. He exhibits no distension and no mass. There is no tenderness. There is no rebound and no guarding.  Musculoskeletal: Normal range of motion. He exhibits no edema or tenderness.  Lymphadenopathy:    He has no cervical adenopathy.  Neurological: He is alert and oriented to person, place,  and time. He has normal reflexes. No cranial nerve deficit. He exhibits normal muscle tone. He displays a negative Romberg sign. Coordination and gait normal.  Skin: Skin is warm and dry. No rash noted.  Psychiatric: He has a normal mood and affect. His behavior is normal. Judgment and thought content normal.  obese irred rhythm  Lab Results  Component Value Date   WBC 7.2 09/05/2015   HGB 13.5 09/05/2015   HCT 40.6 09/05/2015   PLT 142 09/05/2015   GLUCOSE 105 (H) 08/05/2016   CHOL 130 08/05/2016   TRIG 97.0 08/05/2016   HDL 37.20 (L) 08/05/2016   LDLCALC 74 08/05/2016   ALT 19 08/05/2016   AST 20 08/05/2016   NA 140 08/05/2016   K 4.2 08/05/2016   CL 107 08/05/2016   CREATININE 1.07 08/05/2016   BUN 21 08/05/2016   CO2 28 08/05/2016   TSH 3.53 02/04/2016   PSA 2.06 04/22/2009   INR 2.3 01/05/2017   HGBA1C (H) 10/23/2009    6.0 (NOTE)                                                                       According to the ADA Clinical Practice Recommendations for 2011, when HbA1c is used as a screening test:   >=6.5%   Diagnostic of Diabetes Mellitus           (if abnormal result  is confirmed)  5.7-6.4%   Increased risk of developing Diabetes Mellitus  References:Diagnosis and Classification of Diabetes Mellitus,Diabetes ZOXW,9604,54(UJWJX 1):S62-S69 and Standards of Medical Care in         Diabetes - 2011,Diabetes Care,2011,34  (Suppl 1):S11-S61.    Dg Chest 2 View  Result Date: 09/11/2016 CLINICAL DATA:  Routine follow-up, history of right middle lobectomy EXAM: CHEST  2 VIEW COMPARISON:  09/05/2015 FINDINGS: Postsurgical changes are noted in the right hilum. Mild volume loss is noted. Cardiac shadow is within normal limits. No focal infiltrate or sizable effusion is seen. No acute bony abnormality is seen. IMPRESSION: Postsurgical changes on the right.  No acute abnormality noted. Electronically Signed   By: Inez Catalina M.D.   On: 09/11/2016 14:39    Assessment & Plan:    There are no diagnoses linked to this encounter. I am having Bernard [name] maintain his multivitamin-lutein, omeprazole, cholecalciferol, isometheptene-acetaminophen-dichloralphenazone, diazepam, finasteride, nitroGLYCERIN, simvastatin, terazosin, VOLTAREN, and warfarin.  No orders of the defined types were placed in this encounter.    Follow-up: No Follow-up on file.  [name], MD

## 2017-02-03 NOTE — Patient Instructions (Signed)
[facility] well w/Bernard [name]

## 2017-02-03 NOTE — Assessment & Plan Note (Signed)
simvastatin

## 2017-02-19 ENCOUNTER — Ambulatory Visit (INDEPENDENT_AMBULATORY_CARE_PROVIDER_SITE_OTHER): Payer: MEDICARE | Admitting: General Practice

## 2017-02-19 DIAGNOSIS — I4891 Unspecified atrial fibrillation: Secondary | ICD-10-CM

## 2017-02-19 DIAGNOSIS — Z7901 Long term (current) use of anticoagulants: Secondary | ICD-10-CM

## 2017-02-19 LAB — POCT INR: INR: 2

## 2017-02-19 NOTE — Patient Instructions (Addendum)
Pre visit review using our clinic review tool, if applicable. No additional management support is needed unless otherwise documented below in the visit note.  Take 1/2 tablet daily except for 1 tablet on Mondays.  Re-check in 6 weeks.

## 2017-04-02 ENCOUNTER — Ambulatory Visit (INDEPENDENT_AMBULATORY_CARE_PROVIDER_SITE_OTHER): Payer: MEDICARE | Admitting: General Practice

## 2017-04-02 DIAGNOSIS — I4891 Unspecified atrial fibrillation: Secondary | ICD-10-CM | POA: Diagnosis not present

## 2017-04-02 DIAGNOSIS — Z7901 Long term (current) use of anticoagulants: Secondary | ICD-10-CM | POA: Diagnosis not present

## 2017-04-02 LAB — POCT INR: INR: 2.3

## 2017-04-02 NOTE — Patient Instructions (Addendum)
Pre visit review using our clinic review tool, if applicable. No additional management support is needed unless otherwise documented below in the visit note.  Take 1/2 tablet daily except for 1 tablet on Mondays.  Re-check in 6 weeks.

## 2017-05-14 ENCOUNTER — Ambulatory Visit (INDEPENDENT_AMBULATORY_CARE_PROVIDER_SITE_OTHER): Payer: MEDICARE | Admitting: General Practice

## 2017-05-14 DIAGNOSIS — I4891 Unspecified atrial fibrillation: Secondary | ICD-10-CM

## 2017-05-14 DIAGNOSIS — Z7901 Long term (current) use of anticoagulants: Secondary | ICD-10-CM | POA: Diagnosis not present

## 2017-05-14 LAB — POCT INR: INR: 2.2

## 2017-05-14 NOTE — Patient Instructions (Addendum)
Pre visit review using our clinic review tool, if applicable. No additional management support is needed unless otherwise documented below in the visit note.  Take 1/2 tablet daily except for 1 tablet on Mondays.  Re-check in 6 weeks.

## 2017-07-02 ENCOUNTER — Ambulatory Visit: Payer: MEDICARE | Admitting: General Practice

## 2017-07-02 DIAGNOSIS — Z7901 Long term (current) use of anticoagulants: Secondary | ICD-10-CM

## 2017-07-02 DIAGNOSIS — I4891 Unspecified atrial fibrillation: Secondary | ICD-10-CM

## 2017-07-02 LAB — POCT INR: INR: 2.4

## 2017-07-02 NOTE — Patient Instructions (Signed)
Pre visit review using our clinic review tool, if applicable. No additional management support is needed unless otherwise documented below in the visit note.  Take 1/2 tablet daily except for 1 tablet on Mondays.  Re-check in 6 weeks.

## 2017-08-12 ENCOUNTER — Ambulatory Visit (INDEPENDENT_AMBULATORY_CARE_PROVIDER_SITE_OTHER): Payer: MEDICARE | Admitting: Internal Medicine

## 2017-08-12 ENCOUNTER — Encounter: Payer: Self-pay | Admitting: Internal Medicine

## 2017-08-12 ENCOUNTER — Other Ambulatory Visit (INDEPENDENT_AMBULATORY_CARE_PROVIDER_SITE_OTHER): Payer: MEDICARE

## 2017-08-12 DIAGNOSIS — I10 Essential (primary) hypertension: Secondary | ICD-10-CM

## 2017-08-12 DIAGNOSIS — E785 Hyperlipidemia, unspecified: Secondary | ICD-10-CM | POA: Diagnosis not present

## 2017-08-12 DIAGNOSIS — C3491 Malignant neoplasm of unspecified part of right bronchus or lung: Secondary | ICD-10-CM

## 2017-08-12 DIAGNOSIS — I4891 Unspecified atrial fibrillation: Secondary | ICD-10-CM | POA: Diagnosis not present

## 2017-08-12 LAB — CBC WITH DIFFERENTIAL/PLATELET
BASOS ABS: 0 10*3/uL (ref 0.0–0.1)
Basophils Relative: 0.6 % (ref 0.0–3.0)
Eosinophils Absolute: 0.1 10*3/uL (ref 0.0–0.7)
Eosinophils Relative: 1 % (ref 0.0–5.0)
HCT: 42.7 % (ref 39.0–52.0)
Hemoglobin: 14.4 g/dL (ref 13.0–17.0)
Lymphocytes Relative: 32.2 % (ref 12.0–46.0)
Lymphs Abs: 2 10*3/uL (ref 0.7–4.0)
MCHC: 33.8 g/dL (ref 30.0–36.0)
MCV: 91.5 fl (ref 78.0–100.0)
MONO ABS: 0.5 10*3/uL (ref 0.1–1.0)
Monocytes Relative: 7.4 % (ref 3.0–12.0)
NEUTROS PCT: 58.8 % (ref 43.0–77.0)
Neutro Abs: 3.7 10*3/uL (ref 1.4–7.7)
Platelets: 125 10*3/uL — ABNORMAL LOW (ref 150.0–400.0)
RBC: 4.67 Mil/uL (ref 4.22–5.81)
RDW: 14.6 % (ref 11.5–15.5)
WBC: 6.3 10*3/uL (ref 4.0–10.5)

## 2017-08-12 LAB — LIPID PANEL
CHOL/HDL RATIO: 3
Cholesterol: 119 mg/dL (ref 0–200)
HDL: 41.3 mg/dL (ref >39.00)
LDL CALC: 64 mg/dL (ref 0–99)
NonHDL: 78.08
TRIGLYCERIDES: 69 mg/dL (ref 0.0–149.0)
VLDL: 13.8 mg/dL (ref 0.0–40.0)

## 2017-08-12 LAB — BASIC METABOLIC PANEL
BUN: 23 mg/dL (ref 6–23)
CHLORIDE: 106 meq/L (ref 96–112)
CO2: 27 mEq/L (ref 19–32)
CREATININE: 1.07 mg/dL (ref 0.40–1.50)
Calcium: 9.3 mg/dL (ref 8.4–10.5)
GFR: 70.23 mL/min (ref >60.00)
Glucose, Bld: 97 mg/dL (ref 70–99)
Potassium: 4.1 mEq/L (ref 3.5–5.1)
Sodium: 140 mEq/L (ref 135–145)

## 2017-08-12 LAB — HEPATIC FUNCTION PANEL
ALT: 18 U/L (ref 0–53)
AST: 20 U/L (ref 0–37)
Albumin: 4.2 g/dL (ref 3.5–5.2)
Alkaline Phosphatase: 40 U/L (ref 39–117)
BILIRUBIN DIRECT: 0.2 mg/dL (ref 0.0–0.3)
BILIRUBIN TOTAL: 0.9 mg/dL (ref 0.2–1.2)
TOTAL PROTEIN: 7 g/dL (ref 6.0–8.3)

## 2017-08-12 LAB — TSH: TSH: 3.32 u[IU]/mL (ref 0.35–4.50)

## 2017-08-12 NOTE — Patient Instructions (Signed)
[facility] w/Jill 

## 2017-08-12 NOTE — Assessment & Plan Note (Signed)
Terazosin

## 2017-08-12 NOTE — Assessment & Plan Note (Signed)
Coumadin 

## 2017-08-12 NOTE — Progress Notes (Signed)
Subjective:  Patient ID: Bernard [name], male    DOB: 1933-01-01  Age: 84 y.o. MRN: [id number]  CC: No chief complaint on file.   HPI Bernard [name] presents for SOB - much better w/wt loss F/u BPH, lung ca, anticoagulation   Outpatient Medications Prior to Visit  Medication Sig Dispense Refill  . cholecalciferol (VITAMIN D) 1000 UNITS tablet Take 1,000 Units by mouth daily.    . diazepam (VALIUM) 5 MG tablet Take 1 tablet (5 mg total) by mouth 2 (two) times daily. 180 tablet 1  . finasteride (PROSCAR) 5 MG tablet Take 1 tablet (5 mg total) by mouth daily. 90 tablet 3  . isometheptene-acetaminophen-dichloralphenazone (MIDRIN) 65-100-325 MG capsule Take 1 capsule by mouth 4 (four) times daily as needed for migraine or headache. Maximum 5 capsules in 12 hours for migraine headaches, 8 capsules in 24 hours for tension headaches.    . multivitamin-lutein (OCUVITE-LUTEIN) CAPS Take 1 capsule by mouth daily.    . nitroGLYCERIN (NITROSTAT) 0.4 MG SL tablet Place 1 tablet (0.4 mg total) under the tongue every 5 (five) minutes as needed for chest pain (3 DOSES MAX). 25 tablet 2  . omeprazole (PRILOSEC) 20 MG capsule Take 20 mg by mouth daily as needed (heartburn or acid reflux).    . simvastatin (ZOCOR) 20 MG tablet Take 1 tablet (20 mg total) by mouth daily. 90 tablet 3  . terazosin (HYTRIN) 5 MG capsule Take 1 capsule (5 mg total) by mouth at bedtime. 90 capsule 3  . VOLTAREN 1 % GEL Apply 1 application topically 2 (two) times daily. 300 g 3  . warfarin (COUMADIN) 5 MG tablet Take as directed by anticoagulation clinic. 90 tablet 3   No facility-administered medications prior to visit.     ROS: Review of Systems  Constitutional: Negative for appetite change, fatigue and unexpected weight change.  HENT: Negative for congestion, nosebleeds, sneezing, sore throat and trouble swallowing.   Eyes: Negative for itching and visual disturbance.  Respiratory: Negative for cough.   Cardiovascular:  Negative for chest pain, palpitations and leg swelling.  Gastrointestinal: Negative for abdominal distention, blood in stool, diarrhea and nausea.  Genitourinary: Negative for frequency and hematuria.  Musculoskeletal: Positive for arthralgias. Negative for back pain, gait problem, joint swelling and neck pain.  Skin: Negative for rash.  Neurological: Negative for dizziness, tremors, speech difficulty and weakness.  Psychiatric/Behavioral: Negative for agitation, dysphoric mood and sleep disturbance. The patient is not nervous/anxious.     Objective:  BP 126/74 (BP Location: Left Arm, Patient Position: Sitting, Cuff Size: Normal)   Pulse 75   Temp 98.2 F (36.8 C) (Oral)   Ht 5\' 10"  (1.778 m)   Wt 208 lb (94.3 kg)   SpO2 98%   BMI 29.84 kg/m   BP Readings from Last 3 Encounters:  08/12/17 126/74  02/03/17 122/78  10/14/16 130/76    Wt Readings from Last 3 Encounters:  08/12/17 208 lb (94.3 kg)  02/03/17 215 lb (97.5 kg)  10/14/16 212 lb (96.2 kg)    Physical Exam  Constitutional: He is oriented to person, place, and time. He appears well-developed. No distress.  NAD  HENT:  Mouth/Throat: Oropharynx is clear and moist.  Eyes: Pupils are equal, round, and reactive to light. Conjunctivae are normal.  Neck: Normal range of motion. No JVD present. No thyromegaly present.  Cardiovascular: Normal heart sounds and intact distal pulses. Exam reveals no gallop and no friction rub.  No murmur heard. Pulmonary/Chest: Effort  normal and breath sounds normal. No respiratory distress. He has no wheezes. He has no rales. He exhibits no tenderness.  Abdominal: Soft. Bowel sounds are normal. He exhibits no distension and no mass. There is no tenderness. There is no rebound and no guarding.  Musculoskeletal: Normal range of motion. He exhibits no edema or tenderness.  Lymphadenopathy:    He has no cervical adenopathy.  Neurological: He is alert and oriented to person, place, and time. He  has normal reflexes. No cranial nerve deficit. He exhibits normal muscle tone. He displays a negative Romberg sign. Coordination and gait normal.  Skin: Skin is warm and dry. No rash noted.  Psychiatric: He has a normal mood and affect. His behavior is normal. Judgment and thought content normal.  irreg irreg RR  Lab Results  Component Value Date   WBC 7.2 09/05/2015   HGB 13.5 09/05/2015   HCT 40.6 09/05/2015   PLT 142 09/05/2015   GLUCOSE 94 02/03/2017   CHOL 116 02/03/2017   TRIG 77.0 02/03/2017   HDL 39.50 02/03/2017   LDLCALC 61 02/03/2017   ALT 12 02/03/2017   AST 20 02/03/2017   NA 142 02/03/2017   K 4.4 02/03/2017   CL 106 02/03/2017   CREATININE 1.12 02/03/2017   BUN 21 02/03/2017   CO2 26 02/03/2017   TSH 4.14 02/03/2017   PSA 2.06 04/22/2009   INR 2.4 07/02/2017   HGBA1C (H) 10/23/2009    6.0 (NOTE)                                                                       According to the ADA Clinical Practice Recommendations for 2011, when HbA1c is used as a screening test:   >=6.5%   Diagnostic of Diabetes Mellitus           (if abnormal result  is confirmed)  5.7-6.4%   Increased risk of developing Diabetes Mellitus  References:Diagnosis and Classification of Diabetes Mellitus,Diabetes QJJH,4174,08(XKGYJ 1):S62-S69 and Standards of Medical Care in         Diabetes - 2011,Diabetes Care,2011,34  (Suppl 1):S11-S61.    Dg Chest 2 View  Result Date: 09/11/2016 CLINICAL DATA:  Routine follow-up, history of right middle lobectomy EXAM: CHEST  2 VIEW COMPARISON:  09/05/2015 FINDINGS: Postsurgical changes are noted in the right hilum. Mild volume loss is noted. Cardiac shadow is within normal limits. No focal infiltrate or sizable effusion is seen. No acute bony abnormality is seen. IMPRESSION: Postsurgical changes on the right.  No acute abnormality noted. Electronically Signed   By: Inez Catalina M.D.   On: 09/11/2016 14:39    Assessment & Plan:   There are no diagnoses  linked to this encounter.   No orders of the defined types were placed in this encounter.    Follow-up: No follow-ups on file.  [name], MD

## 2017-08-12 NOTE — Assessment & Plan Note (Signed)
Simvastatin qd

## 2017-08-12 NOTE — Assessment & Plan Note (Signed)
CXR q 12 mo

## 2017-08-13 ENCOUNTER — Ambulatory Visit (INDEPENDENT_AMBULATORY_CARE_PROVIDER_SITE_OTHER): Payer: MEDICARE | Admitting: General Practice

## 2017-08-13 DIAGNOSIS — Z7901 Long term (current) use of anticoagulants: Secondary | ICD-10-CM

## 2017-08-13 DIAGNOSIS — I4891 Unspecified atrial fibrillation: Secondary | ICD-10-CM

## 2017-08-13 LAB — POCT INR: INR: 2.4 (ref 2.0–3.0)

## 2017-08-13 NOTE — Patient Instructions (Addendum)
Pre visit review using our clinic review tool, if applicable. No additional management support is needed unless otherwise documented below in the visit note.  Take 1/2 tablet daily except for 1 tablet on Mondays.  Re-check in 6 weeks.

## 2017-09-13 ENCOUNTER — Ambulatory Visit (INDEPENDENT_AMBULATORY_CARE_PROVIDER_SITE_OTHER)
Admission: RE | Admit: 2017-09-13 | Discharge: 2017-09-13 | Disposition: A | Discharge status: Home / Self Care | Payer: MEDICARE | Source: Ambulatory Visit | Attending: Internal Medicine | Admitting: Internal Medicine

## 2017-09-13 DIAGNOSIS — C3491 Malignant neoplasm of unspecified part of right bronchus or lung: Secondary | ICD-10-CM

## 2017-09-24 ENCOUNTER — Ambulatory Visit: Payer: MEDICARE

## 2017-09-24 ENCOUNTER — Ambulatory Visit (INDEPENDENT_AMBULATORY_CARE_PROVIDER_SITE_OTHER): Payer: MEDICARE | Admitting: General Practice

## 2017-09-24 DIAGNOSIS — Z7901 Long term (current) use of anticoagulants: Secondary | ICD-10-CM

## 2017-09-24 DIAGNOSIS — I4891 Unspecified atrial fibrillation: Secondary | ICD-10-CM

## 2017-09-24 LAB — POCT INR: INR: 2.3 (ref 2.0–3.0)

## 2017-09-24 NOTE — Patient Instructions (Addendum)
Pre visit review using our clinic review tool, if applicable. No additional management support is needed unless otherwise documented below in the visit note. Take 1/2 tablet daily except for 1 tablet on Mondays.  Re-check in 6 weeks.

## 2017-11-05 ENCOUNTER — Ambulatory Visit (INDEPENDENT_AMBULATORY_CARE_PROVIDER_SITE_OTHER): Payer: MEDICARE | Admitting: General Practice

## 2017-11-05 DIAGNOSIS — Z7901 Long term (current) use of anticoagulants: Secondary | ICD-10-CM

## 2017-11-05 DIAGNOSIS — I4891 Unspecified atrial fibrillation: Secondary | ICD-10-CM

## 2017-11-05 DIAGNOSIS — Z23 Encounter for immunization: Secondary | ICD-10-CM

## 2017-11-05 LAB — POCT INR: INR: 2.1 (ref 2.0–3.0)

## 2017-11-05 NOTE — Patient Instructions (Addendum)
Pre visit review using our clinic review tool, if applicable. No additional management support is needed unless otherwise documented below in the visit note.  Take 1/2 tablet daily except for 1 tablet on Mondays.  Re-check in 6 weeks.

## 2017-12-02 ENCOUNTER — Ambulatory Visit (INDEPENDENT_AMBULATORY_CARE_PROVIDER_SITE_OTHER): Payer: MEDICARE | Admitting: Internal Medicine

## 2017-12-02 ENCOUNTER — Encounter: Payer: Self-pay | Admitting: Internal Medicine

## 2017-12-02 VITALS — BP 112/68 | HR 80 | Ht 69.0 in | Wt 205.0 lb

## 2017-12-02 DIAGNOSIS — I4819 Other persistent atrial fibrillation: Secondary | ICD-10-CM | POA: Diagnosis not present

## 2017-12-02 DIAGNOSIS — I10 Essential (primary) hypertension: Secondary | ICD-10-CM | POA: Diagnosis not present

## 2017-12-02 NOTE — Patient Instructions (Addendum)
Medication Instructions:  Your physician recommends that you continue on your current medications as directed. Please refer to the Current Medication list given to you today.  Labwork: None ordered.  Testing/Procedures: None ordered.  Follow-Up: Your physician wants you to follow-up in: one year with Tommye Standard, PA.   You will receive a reminder letter in the mail two months in advance. If you don't receive a letter, please call our office to schedule the follow-up appointment.   Any Other Special Instructions Will Be Listed Below (If Applicable).  If you need a refill on your cardiac medications before your next appointment, please call your pharmacy.

## 2017-12-02 NOTE — Progress Notes (Signed)
PCP: Plotnikov, Evie Lacks, MD   Primary EP:  Dr Rayann Heman  Bernard [name] is a 84 y.o. male who presents today for routine electrophysiology followup.  Since last being seen in our clinic, the patient reports doing reasonably well.   SOB is stable (worse with inclines).  He also has occasional fatigue.  He is primarily limited by DJD.  Today, he denies symptoms of palpitations, chest pain,  lower extremity edema, dizziness, presyncope, or syncope.  The patient is otherwise without complaint today.   Past Medical History:  Diagnosis Date  . CAD (coronary artery disease)    s/p MI 1976, normal myoview 7/11  . Depressive disorder, not elsewhere classified   . Esophageal reflux   . Headache    migraines occ., none in months.  . History of BPH    benign prostatic hypertrophy  . Labyrinthitis, unspecified   . Malignant neoplasm middle lobe, bronchus or lung    right lung. Dx 07/27/08, sp/p resection 7/10.   . Osteoarthrosis, unspecified whether generalized or localized, lower leg    knee  . Other and unspecified hyperlipidemia   . Paroxysmal atrial fibrillation (HCC)    postoperative after lung surgery  . Renal cyst, right    being followed  . Stroke [facility])    TIA March 2000     no side effects now  . Unspecified essential hypertension    Past Surgical History:  Procedure Laterality Date  . anthroscopic surgery  '10   right knee \  . BUNIONECTOMY  '89   left foot  . CATARACT EXTRACTION, BILATERAL    . ESOPHAGOGASTRODUODENOSCOPY  05/14/04  . EYE SURGERY     both eyes - 2011 [facility])  . INGUINAL HERNIA REPAIR  '71   left  . right TKR  8/11  . TOTAL KNEE ARTHROPLASTY Left 09/24/2014   Procedure: LEFT TOTAL KNEE ARTHROPLASTY;  Surgeon: Gaynelle Arabian, MD;  Location: [facility];  Service: Orthopedics;  Laterality: Left;  [name] VAT-RML lobectomy      ROS- all systems are reviewed and negative except as per HPI above  Current Outpatient Medications  Medication Sig Dispense Refill  .  cholecalciferol (VITAMIN D) 1000 UNITS tablet Take 1,000 Units by mouth daily.    . diazepam (VALIUM) 5 MG tablet Take 1 tablet (5 mg total) by mouth 2 (two) times daily. 180 tablet 1  . finasteride (PROSCAR) 5 MG tablet Take 1 tablet (5 mg total) by mouth daily. 90 tablet 3  . isometheptene-acetaminophen-dichloralphenazone (MIDRIN) 65-100-325 MG capsule Take 1 capsule by mouth 4 (four) times daily as needed for migraine or headache. Maximum 5 capsules in 12 hours for migraine headaches, 8 capsules in 24 hours for tension headaches.    . multivitamin-lutein (OCUVITE-LUTEIN) CAPS Take 1 capsule by mouth daily.    . nitroGLYCERIN (NITROSTAT) 0.4 MG SL tablet Place 1 tablet (0.4 mg total) under the tongue every 5 (five) minutes as needed for chest pain (3 DOSES MAX). 25 tablet 2  . omeprazole (PRILOSEC) 20 MG capsule Take 20 mg by mouth daily as needed (heartburn or acid reflux).    . simvastatin (ZOCOR) 20 MG tablet Take 1 tablet (20 mg total) by mouth daily. 90 tablet 3  . terazosin (HYTRIN) 5 MG capsule Take 1 capsule (5 mg total) by mouth at bedtime. 90 capsule 3  . VOLTAREN 1 % GEL Apply 1 application topically 2 (two) times daily. 300 g 3  . warfarin (COUMADIN) 5 MG tablet Take as  directed by anticoagulation clinic. 90 tablet 3   No current facility-administered medications for this visit.     Physical Exam: Vitals:   12/02/17 1208  BP: 112/68  Pulse: 80  SpO2: 97%  Weight: 205 lb (93 kg)  Height: 5\' 9"  (1.753 m)    GEN- The patient is well appearing, alert and oriented x 3 today.   Head- normocephalic, atraumatic Eyes-  Sclera clear, conjunctiva pink Ears- hearing intact Oropharynx- clear Lungs- Clear to ausculation bilaterally, normal work of breathing Heart- irregular rate and rhythm, no murmurs, rubs or gallops, PMI not laterally displaced GI- soft, NT, ND, + BS Extremities- no clubbing, cyanosis, or edema   ekg tracing ordered today is personally reviewed and shows afib, 80  bpm  Assessment and Plan:  1. Longstanding persistent afib Minimally symptomatic chads2vasc score is 6.  Continue coumadin He does not wish for an aggressive approach  2. HTN Stable No change required today  3. CAD No ischemic symptoms  Return to see EP PA every year  Thompson Grayer MD, [facility] 12/02/2017 12:36 PM

## 2017-12-14 IMAGING — DX DG CERVICAL SPINE COMPLETE 4+V
5 series · 5 of 5 positions shown · non-contrast
Comparison: None.

CLINICAL DATA: 82-year-old male with a history of cervical neck
pain for 2 weeks.

EXAM:
CERVICAL SPINE - COMPLETE 4+ VIEW

[c-spine lat]
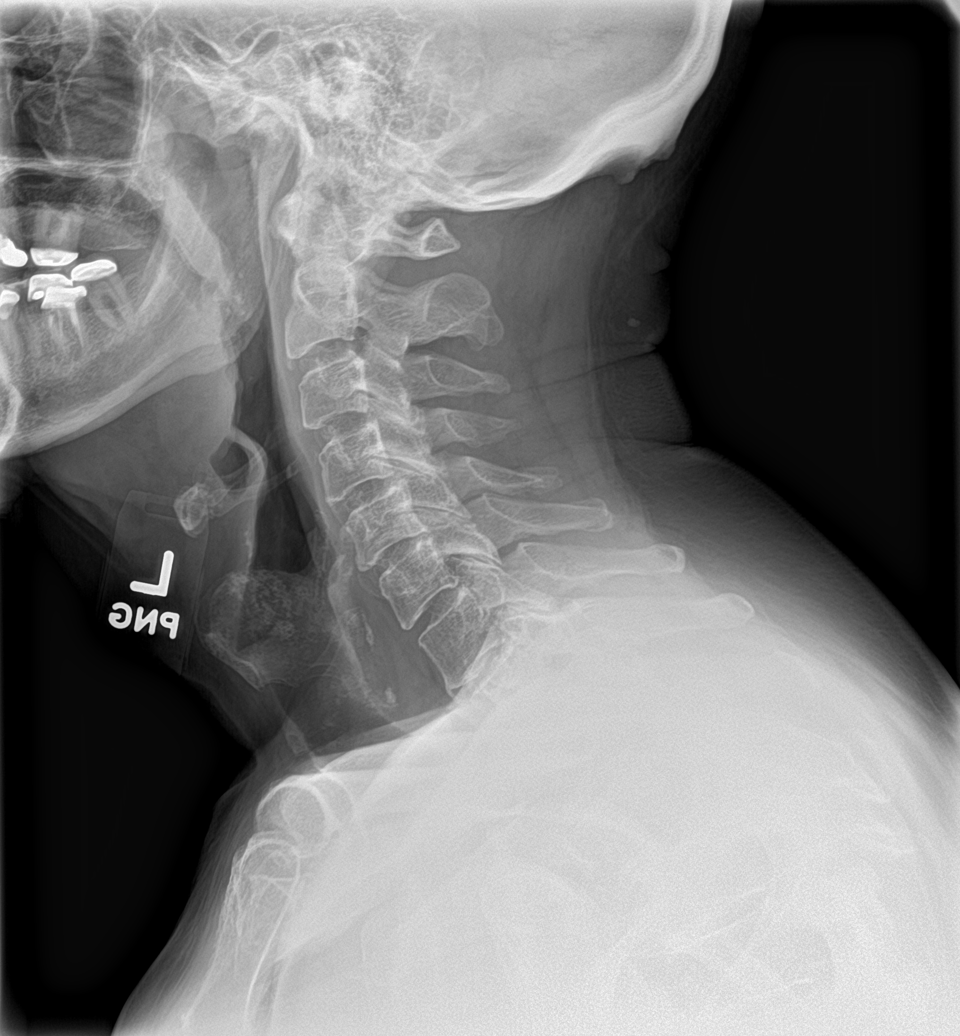

[c-spine obl (1 of 2)]
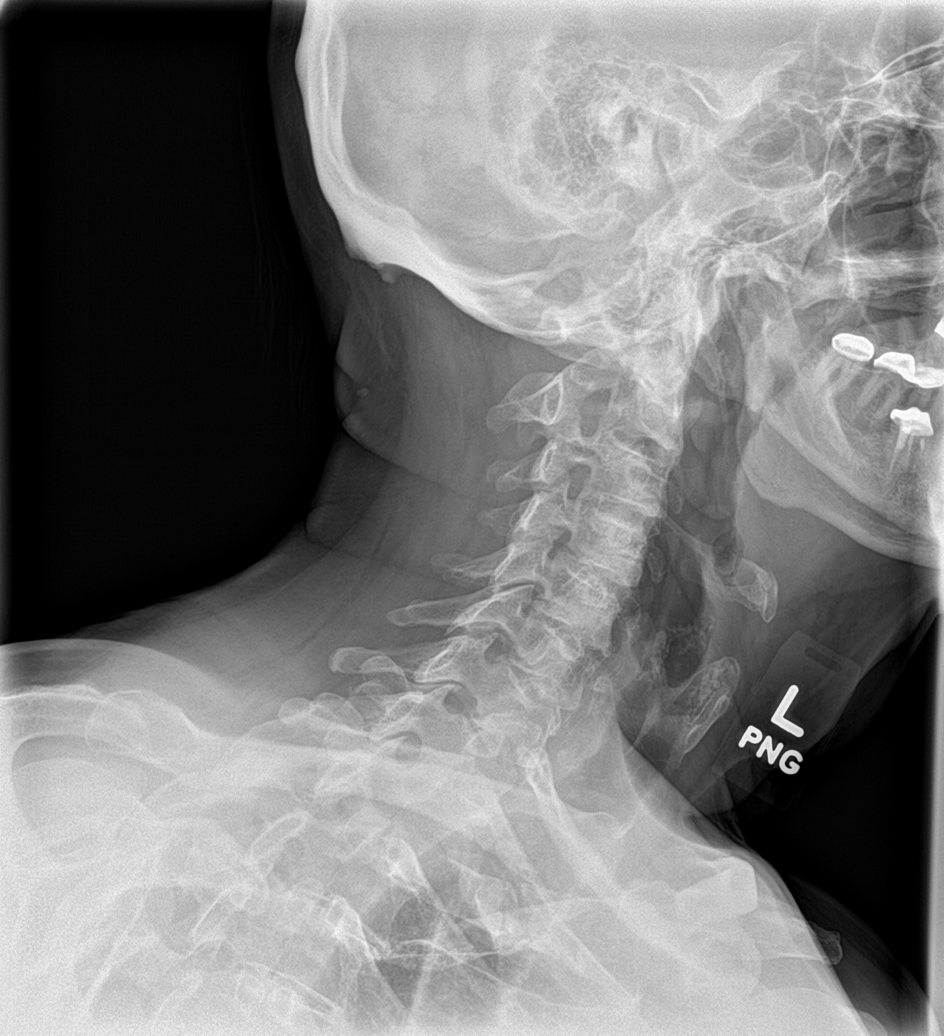

[c-spine obl (2 of 2)]
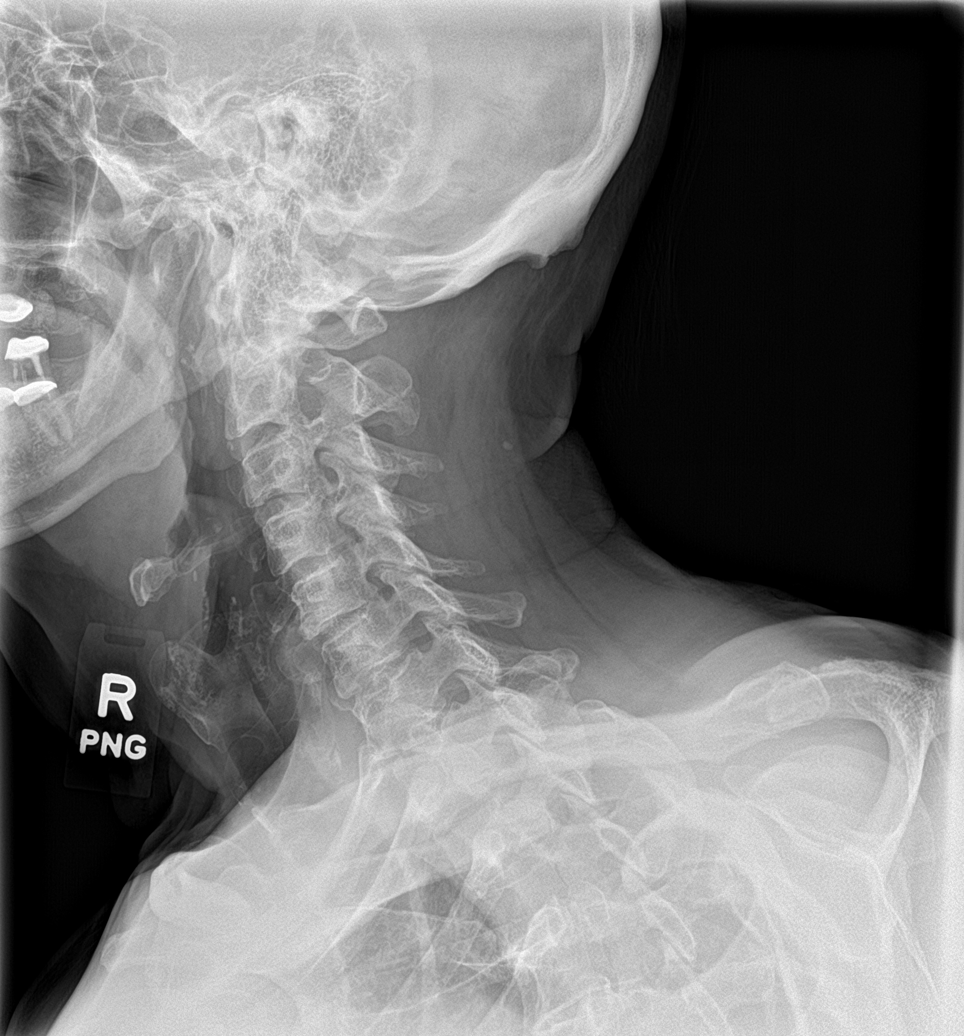

[c-spine ap]
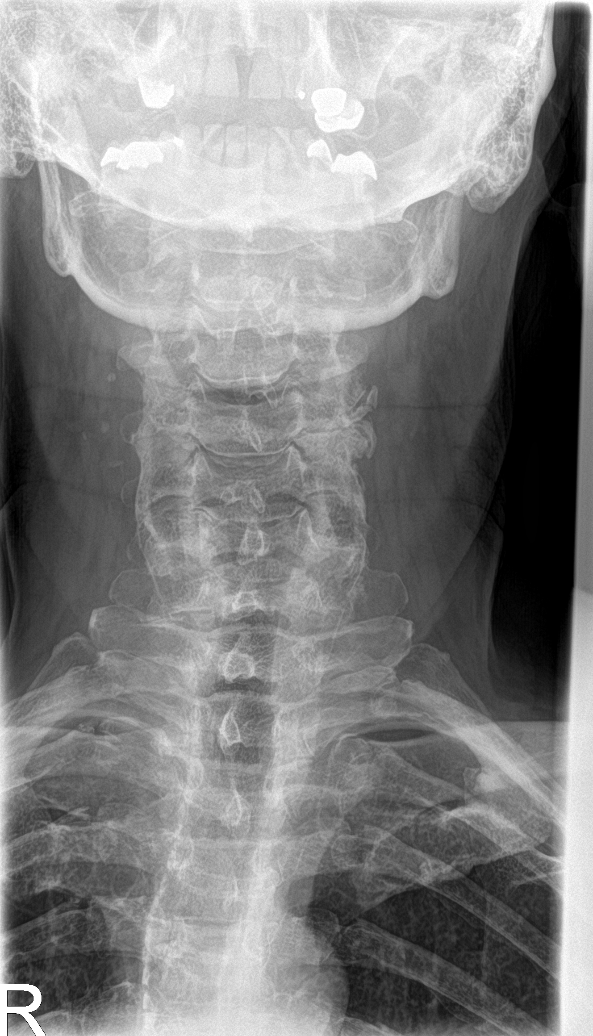

[c-spine open mouth]
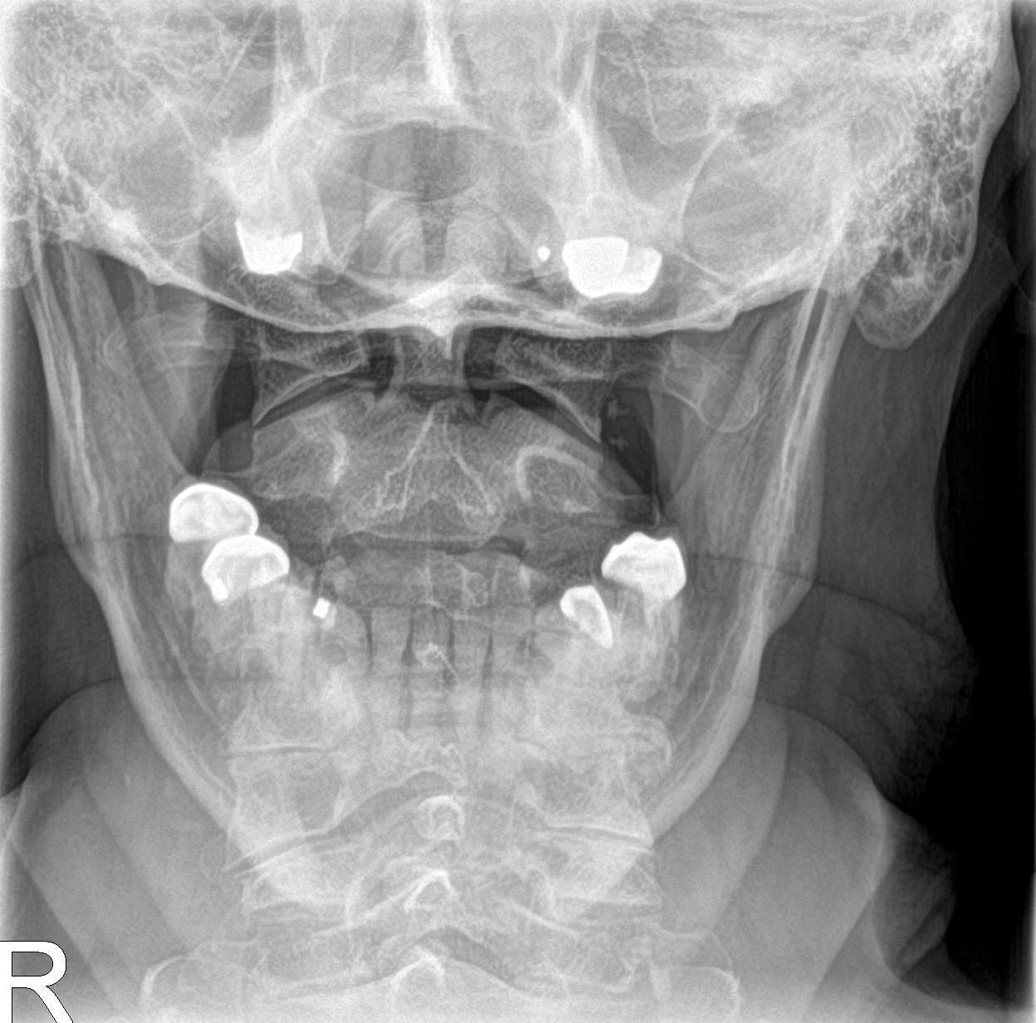

[5 of 5 positions shown; findings below may reference images not displayed]

FINDINGS: Cervical Spine:

Cervical elements from the level of the C1-T1 maintain alignment,
without subluxation.

Trace anterolisthesis of C3 on C4 and C4 on C5.

No acute fracture line identified.

Unremarkable appearance of the craniocervical junction.

Vertebral body heights maintained. Mild disc space loss at multiple
levels.

Right oblique images demonstrate moderate foraminal narrowing at the
level of C4-C5 and C5-C6 secondary to uncovertebral joint disease
and facet disease.

Left oblique images demonstrate moderate to advanced foraminal
narrowing at the level of C3-C4 and C4-C5, with moderate narrowing
at C5-C6.

Bilateral facet disease.

Unremarkable open mouth odontoid view.

Prevertebral soft tissues within normal limits.
IMPRESSION: No radiographic evidence of acute fracture or malalignment of the
cervical spine.

Moderate right foraminal narrowing at at C4-C5 and bilateral at
C5-C6. There is more severe narrowing on the left at C3-C4 and
C4-C5.

## 2017-12-17 ENCOUNTER — Ambulatory Visit (INDEPENDENT_AMBULATORY_CARE_PROVIDER_SITE_OTHER): Payer: MEDICARE | Admitting: General Practice

## 2017-12-17 DIAGNOSIS — Z7901 Long term (current) use of anticoagulants: Secondary | ICD-10-CM | POA: Diagnosis not present

## 2017-12-17 DIAGNOSIS — I4891 Unspecified atrial fibrillation: Secondary | ICD-10-CM

## 2017-12-17 LAB — POCT INR: INR: 2.1 (ref 2.0–3.0)

## 2017-12-17 NOTE — Patient Instructions (Signed)
Pre visit review using our clinic review tool, if applicable. No additional management support is needed unless otherwise documented below in the visit note.  Take 1/2 tablet daily except for 1 tablet on Mondays.  Re-check in 6 weeks.

## 2017-12-17 NOTE — Progress Notes (Signed)
Agree with management.  Binnie Rail, MD

## 2018-01-28 ENCOUNTER — Ambulatory Visit (INDEPENDENT_AMBULATORY_CARE_PROVIDER_SITE_OTHER): Payer: MEDICARE | Admitting: General Practice

## 2018-01-28 DIAGNOSIS — I4891 Unspecified atrial fibrillation: Secondary | ICD-10-CM | POA: Diagnosis not present

## 2018-01-28 DIAGNOSIS — Z7901 Long term (current) use of anticoagulants: Secondary | ICD-10-CM | POA: Diagnosis not present

## 2018-01-28 LAB — POCT INR: INR: 2.1 (ref 2.0–3.0)

## 2018-01-28 NOTE — Patient Instructions (Addendum)
Pre visit review using our clinic review tool, if applicable. No additional management support is needed unless otherwise documented below in the visit note.  Take 1/2 tablet daily except for 1 tablet on Mondays.  Re-check in 6 weeks.

## 2018-02-03 ENCOUNTER — Other Ambulatory Visit (INDEPENDENT_AMBULATORY_CARE_PROVIDER_SITE_OTHER): Payer: MEDICARE

## 2018-02-03 ENCOUNTER — Encounter: Payer: Self-pay | Admitting: Internal Medicine

## 2018-02-03 ENCOUNTER — Ambulatory Visit (INDEPENDENT_AMBULATORY_CARE_PROVIDER_SITE_OTHER): Payer: MEDICARE | Admitting: Internal Medicine

## 2018-02-03 VITALS — BP 120/78 | HR 83 | Temp 98.2°F | Ht 69.0 in | Wt 208.0 lb

## 2018-02-03 DIAGNOSIS — I4891 Unspecified atrial fibrillation: Secondary | ICD-10-CM

## 2018-02-03 DIAGNOSIS — E785 Hyperlipidemia, unspecified: Secondary | ICD-10-CM

## 2018-02-03 DIAGNOSIS — Z7901 Long term (current) use of anticoagulants: Secondary | ICD-10-CM | POA: Diagnosis not present

## 2018-02-03 DIAGNOSIS — I2510 Atherosclerotic heart disease of native coronary artery without angina pectoris: Secondary | ICD-10-CM

## 2018-02-03 DIAGNOSIS — I10 Essential (primary) hypertension: Secondary | ICD-10-CM | POA: Diagnosis not present

## 2018-02-03 LAB — LIPID PANEL
CHOLESTEROL: 115 mg/dL (ref 0–200)
HDL: 38.8 mg/dL — ABNORMAL LOW (ref >39.00)
LDL Cholesterol: 61 mg/dL (ref 0–99)
NonHDL: 76.55
TRIGLYCERIDES: 76 mg/dL (ref 0.0–149.0)
Total CHOL/HDL Ratio: 3
VLDL: 15.2 mg/dL (ref 0.0–40.0)

## 2018-02-03 LAB — TSH: TSH: 3.58 u[IU]/mL (ref 0.35–4.50)

## 2018-02-03 LAB — CBC WITH DIFFERENTIAL/PLATELET
Basophils Absolute: 0.1 10*3/uL (ref 0.0–0.1)
Basophils Relative: 1 % (ref 0.0–3.0)
Eosinophils Absolute: 0.1 10*3/uL (ref 0.0–0.7)
Eosinophils Relative: 1 % (ref 0.0–5.0)
HCT: 44.7 % (ref 39.0–52.0)
Hemoglobin: 14.8 g/dL (ref 13.0–17.0)
Lymphocytes Relative: 30.9 % (ref 12.0–46.0)
Lymphs Abs: 2.2 10*3/uL (ref 0.7–4.0)
MCHC: 33.2 g/dL (ref 30.0–36.0)
MCV: 92.3 fl (ref 78.0–100.0)
Monocytes Absolute: 0.6 10*3/uL (ref 0.1–1.0)
Monocytes Relative: 8.4 % (ref 3.0–12.0)
Neutro Abs: 4.2 10*3/uL (ref 1.4–7.7)
Neutrophils Relative %: 58.7 % (ref 43.0–77.0)
Platelets: 131 10*3/uL — ABNORMAL LOW (ref 150.0–400.0)
RBC: 4.85 Mil/uL (ref 4.22–5.81)
RDW: 14.5 % (ref 11.5–15.5)
WBC: 7.2 10*3/uL (ref 4.0–10.5)

## 2018-02-03 LAB — BASIC METABOLIC PANEL
BUN: 19 mg/dL (ref 6–23)
CO2: 29 mEq/L (ref 19–32)
Calcium: 9.3 mg/dL (ref 8.4–10.5)
Chloride: 105 mEq/L (ref 96–112)
Creatinine, Ser: 1.12 mg/dL (ref 0.40–1.50)
GFR: 66.54 mL/min (ref >60.00)
Glucose, Bld: 105 mg/dL — ABNORMAL HIGH (ref 70–99)
Potassium: 4.2 mEq/L (ref 3.5–5.1)
Sodium: 140 mEq/L (ref 135–145)

## 2018-02-03 LAB — HEPATIC FUNCTION PANEL
ALT: 13 U/L (ref 0–53)
AST: 18 U/L (ref 0–37)
Albumin: 4.3 g/dL (ref 3.5–5.2)
Alkaline Phosphatase: 34 U/L — ABNORMAL LOW (ref 39–117)
Bilirubin, Direct: 0.2 mg/dL (ref 0.0–0.3)
Total Bilirubin: 0.9 mg/dL (ref 0.2–1.2)
Total Protein: 6.8 g/dL (ref 6.0–8.3)

## 2018-02-03 MED ORDER — NITROGLYCERIN 0.4 MG SL SUBL: 0.4000 mg | SUBLINGUAL_TABLET | SUBLINGUAL | 2 refills | Status: DC | PRN

## 2018-02-03 MED ORDER — DIAZEPAM 5 MG PO TABS: 5.0000 mg | ORAL_TABLET | Freq: Two times a day (BID) | ORAL | 1 refills | Status: DC

## 2018-02-03 MED ORDER — FINASTERIDE 5 MG PO TABS: 5.0000 mg | ORAL_TABLET | Freq: Every day | ORAL | 3 refills | Status: DC

## 2018-02-03 MED ORDER — TERAZOSIN HCL 5 MG PO CAPS: 5.0000 mg | ORAL_CAPSULE | Freq: Every day | ORAL | 3 refills | Status: DC

## 2018-02-03 MED ORDER — SIMVASTATIN 20 MG PO TABS: 20.0000 mg | ORAL_TABLET | Freq: Every day | ORAL | 3 refills | Status: DC

## 2018-02-03 MED ORDER — WARFARIN SODIUM 5 MG PO TABS: ORAL_TABLET | ORAL | 3 refills | Status: DC

## 2018-02-03 NOTE — Assessment & Plan Note (Signed)
On Terazosin

## 2018-02-03 NOTE — Assessment & Plan Note (Signed)
Not on ASA (on Coumadin), NTG prn On Simvastatin No CP

## 2018-02-03 NOTE — Progress Notes (Signed)
Subjective:  Patient ID: Bernard [name], male    DOB: 1932/09/25  Age: 84 y.o. MRN: [id number]  CC: No chief complaint on file.   HPI Bernard [name] presents for A fib, CAD, BPH f/u  Outpatient Medications Prior to Visit  Medication Sig Dispense Refill  . cholecalciferol (VITAMIN D) 1000 UNITS tablet Take 1,000 Units by mouth daily.    . diazepam (VALIUM) 5 MG tablet Take 1 tablet (5 mg total) by mouth 2 (two) times daily. 180 tablet 1  . finasteride (PROSCAR) 5 MG tablet Take 1 tablet (5 mg total) by mouth daily. 90 tablet 3  . isometheptene-acetaminophen-dichloralphenazone (MIDRIN) 65-100-325 MG capsule Take 1 capsule by mouth 4 (four) times daily as needed for migraine or headache. Maximum 5 capsules in 12 hours for migraine headaches, 8 capsules in 24 hours for tension headaches.    . multivitamin-lutein (OCUVITE-LUTEIN) CAPS Take 1 capsule by mouth daily.    . nitroGLYCERIN (NITROSTAT) 0.4 MG SL tablet Place 1 tablet (0.4 mg total) under the tongue every 5 (five) minutes as needed for chest pain (3 DOSES MAX). 25 tablet 2  . omeprazole (PRILOSEC) 20 MG capsule Take 20 mg by mouth daily as needed (heartburn or acid reflux).    . simvastatin (ZOCOR) 20 MG tablet Take 1 tablet (20 mg total) by mouth daily. 90 tablet 3  . terazosin (HYTRIN) 5 MG capsule Take 1 capsule (5 mg total) by mouth at bedtime. 90 capsule 3  . VOLTAREN 1 % GEL Apply 1 application topically 2 (two) times daily. 300 g 3  . warfarin (COUMADIN) 5 MG tablet Take as directed by anticoagulation clinic. 90 tablet 3   No facility-administered medications prior to visit.     ROS: Review of Systems  Constitutional: Negative for appetite change, fatigue and unexpected weight change.  HENT: Negative for congestion, nosebleeds, sneezing, sore throat and trouble swallowing.   Eyes: Negative for itching and visual disturbance.  Respiratory: Negative for cough.   Cardiovascular: Negative for chest pain, palpitations and  leg swelling.  Gastrointestinal: Negative for abdominal distention, blood in stool, diarrhea and nausea.  Genitourinary: Negative for frequency and hematuria.  Musculoskeletal: Positive for arthralgias. Negative for back pain, gait problem, joint swelling and neck pain.  Skin: Negative for rash.  Neurological: Negative for dizziness, tremors, speech difficulty and weakness.  Psychiatric/Behavioral: Negative for agitation, dysphoric mood, sleep disturbance and suicidal ideas. The patient is not nervous/anxious.     Objective:  BP 120/78 (BP Location: Left Arm, Patient Position: Sitting, Cuff Size: Normal)   Pulse 83   Temp 98.2 F (36.8 C) (Oral)   Ht 5\' 9"  (1.753 m)   Wt 208 lb (94.3 kg)   SpO2 95%   BMI 30.72 kg/m   BP Readings from Last 3 Encounters:  02/03/18 120/78  12/02/17 112/68  08/12/17 126/74    Wt Readings from Last 3 Encounters:  02/03/18 208 lb (94.3 kg)  12/02/17 205 lb (93 kg)  08/12/17 208 lb (94.3 kg)    Physical Exam Constitutional:      General: He is not in acute distress.    Appearance: He is well-developed.     Comments: NAD  Eyes:     Conjunctiva/sclera: Conjunctivae normal.     Pupils: Pupils are equal, round, and reactive to light.  Neck:     Musculoskeletal: Normal range of motion.     Thyroid: No thyromegaly.     Vascular: No JVD.  Cardiovascular:  Rate and Rhythm: Normal rate. Rhythm irregular.     Heart sounds: Normal heart sounds. No murmur. No friction rub. No gallop.   Pulmonary:     Effort: Pulmonary effort is normal. No respiratory distress.     Breath sounds: Normal breath sounds. No wheezing or rales.  Chest:     Chest wall: No tenderness.  Abdominal:     General: Bowel sounds are normal. There is no distension.     Palpations: Abdomen is soft. There is no mass.     Tenderness: There is no abdominal tenderness. There is no guarding or rebound.  Musculoskeletal: Normal range of motion.        General: No tenderness.    Lymphadenopathy:     Cervical: No cervical adenopathy.  Skin:    General: Skin is warm and dry.     Findings: No rash.  Neurological:     Mental Status: He is alert and oriented to person, place, and time.     Cranial Nerves: No cranial nerve deficit.     Motor: No abnormal muscle tone.     Coordination: Coordination normal.     Gait: Gait normal.     Deep Tendon Reflexes: Reflexes are normal and symmetric.  Psychiatric:        Behavior: Behavior normal.        Thought Content: Thought content normal.        Judgment: Judgment normal.   irreg irreg RR  Lab Results  Component Value Date   WBC 6.3 08/12/2017   HGB 14.4 08/12/2017   HCT 42.7 08/12/2017   PLT 125.0 (L) 08/12/2017   GLUCOSE 97 08/12/2017   CHOL 119 08/12/2017   TRIG 69.0 08/12/2017   HDL 41.30 08/12/2017   LDLCALC 64 08/12/2017   ALT 18 08/12/2017   AST 20 08/12/2017   NA 140 08/12/2017   K 4.1 08/12/2017   CL 106 08/12/2017   CREATININE 1.07 08/12/2017   BUN 23 08/12/2017   CO2 27 08/12/2017   TSH 3.32 08/12/2017   PSA 2.06 04/22/2009   INR 2.1 01/28/2018   HGBA1C (H) 10/23/2009    6.0 (NOTE)                                                                       According to the ADA Clinical Practice Recommendations for 2011, when HbA1c is used as a screening test:   >=6.5%   Diagnostic of Diabetes Mellitus           (if abnormal result  is confirmed)  5.7-6.4%   Increased risk of developing Diabetes Mellitus  References:Diagnosis and Classification of Diabetes Mellitus,Diabetes DXIP,3825,05(LZJQB 1):S62-S69 and Standards of Medical Care in         Diabetes - 2011,Diabetes Care,2011,34  (Suppl 1):S11-S61.    Dg Chest 2 View  Result Date: 09/13/2017 CLINICAL DATA:  History lung carcinoma. EXAM: CHEST - 2 VIEW COMPARISON:  09/11/2016 FINDINGS: Stable top-normal heart size. Stable evidence of prior right middle lobectomy. There is no evidence of pulmonary edema, consolidation, pneumothorax, nodule or  pleural fluid. The visualized bony thorax is unremarkable. IMPRESSION: No active cardiopulmonary disease. Electronically Signed   By: Aletta Edouard M.D.   On: 09/13/2017 14:08  Assessment & Plan:   There are no diagnoses linked to this encounter.   No orders of the defined types were placed in this encounter.    Follow-up: No follow-ups on file.  [name], MD

## 2018-02-03 NOTE — Assessment & Plan Note (Signed)
Coumadin 

## 2018-02-03 NOTE — Assessment & Plan Note (Signed)
In A fib now

## 2018-02-11 ENCOUNTER — Ambulatory Visit: Payer: MEDICARE | Admitting: Internal Medicine

## 2018-03-11 ENCOUNTER — Ambulatory Visit: Payer: MEDICARE

## 2018-03-11 ENCOUNTER — Ambulatory Visit (INDEPENDENT_AMBULATORY_CARE_PROVIDER_SITE_OTHER): Payer: MEDICARE | Admitting: General Practice

## 2018-03-11 DIAGNOSIS — Z7901 Long term (current) use of anticoagulants: Secondary | ICD-10-CM | POA: Diagnosis not present

## 2018-03-11 DIAGNOSIS — I4891 Unspecified atrial fibrillation: Secondary | ICD-10-CM

## 2018-03-11 LAB — POCT INR: INR: 2 (ref 2.0–3.0)

## 2018-03-11 NOTE — Patient Instructions (Addendum)
Pre visit review using our clinic review tool, if applicable. No additional management support is needed unless otherwise documented below in the visit note.  Take 1/2 tablet daily except for 1 tablet on Mondays.  Re-check in 6 weeks.

## 2018-04-22 ENCOUNTER — Ambulatory Visit (INDEPENDENT_AMBULATORY_CARE_PROVIDER_SITE_OTHER): Payer: MEDICARE | Admitting: General Practice

## 2018-04-22 DIAGNOSIS — Z7901 Long term (current) use of anticoagulants: Secondary | ICD-10-CM | POA: Diagnosis not present

## 2018-04-22 DIAGNOSIS — I4891 Unspecified atrial fibrillation: Secondary | ICD-10-CM

## 2018-04-22 LAB — POCT INR: INR: 2.4 (ref 2.0–3.0)

## 2018-04-22 NOTE — Patient Instructions (Addendum)
Pre visit review using our clinic review tool, if applicable. No additional management support is needed unless otherwise documented below in the visit note.  Take 1/2 tablet daily except for 1 tablet on Mondays.  Re-check in 6 weeks.

## 2018-05-27 ENCOUNTER — Ambulatory Visit (INDEPENDENT_AMBULATORY_CARE_PROVIDER_SITE_OTHER): Payer: MEDICARE | Admitting: General Practice

## 2018-05-27 ENCOUNTER — Other Ambulatory Visit: Payer: Self-pay

## 2018-05-27 DIAGNOSIS — Z7901 Long term (current) use of anticoagulants: Secondary | ICD-10-CM

## 2018-05-27 LAB — POCT INR: INR: 1.8 — AB (ref 2.0–3.0)

## 2018-05-27 NOTE — Patient Instructions (Signed)
Pre visit review using our clinic review tool, if applicable. No additional management support is needed unless otherwise documented below in the visit note.  Take 1/2 tablet daily except for 1 tablet on Mondays.  Re-check in 4 weeks.

## 2018-06-24 ENCOUNTER — Ambulatory Visit (INDEPENDENT_AMBULATORY_CARE_PROVIDER_SITE_OTHER): Payer: MEDICARE | Admitting: General Practice

## 2018-06-24 DIAGNOSIS — Z7901 Long term (current) use of anticoagulants: Secondary | ICD-10-CM | POA: Diagnosis not present

## 2018-06-24 LAB — POCT INR: INR: 1.9 — AB (ref 2.0–3.0)

## 2018-06-24 NOTE — Patient Instructions (Addendum)
Pre visit review using our clinic review tool, if applicable. No additional management support is needed unless otherwise documented below in the visit note.  Change dosage and take 1/2 tablet daily except for 1 tablet on Monday and Fridays.  Re-check in 6 weeks.

## 2018-08-01 ENCOUNTER — Other Ambulatory Visit: Payer: Self-pay | Admitting: Internal Medicine

## 2018-08-01 NOTE — Telephone Encounter (Signed)
Copied from [place] [phone]. Topic: General - Other >> Aug 01, 2018  4:09 PM Percell Belt A wrote: Reason for CRM:  [facility] on [address] called in and state that pt diazepam (VALIUM) 5 MG tablet [[id number]] will expire tomorrow and they are going to have to order it.  They need a script sent over.    Best number  [phone]

## 2018-08-02 MED ORDER — DIAZEPAM 5 MG PO TABS: 5.0000 mg | ORAL_TABLET | Freq: Two times a day (BID) | ORAL | 1 refills | Status: DC

## 2018-08-02 NOTE — Addendum Note (Signed)
Addended by: Cassandria Anger on: 08/02/2018 02:02 PM   Modules accepted: Orders

## 2018-08-02 NOTE — Addendum Note (Signed)
Addended by: Cresenciano Lick on: 08/02/2018 10:18 AM   Modules accepted: Orders

## 2018-08-05 ENCOUNTER — Ambulatory Visit (INDEPENDENT_AMBULATORY_CARE_PROVIDER_SITE_OTHER): Payer: MEDICARE | Admitting: General Practice

## 2018-08-05 ENCOUNTER — Other Ambulatory Visit: Payer: Self-pay

## 2018-08-05 DIAGNOSIS — Z7901 Long term (current) use of anticoagulants: Secondary | ICD-10-CM

## 2018-08-05 DIAGNOSIS — I4891 Unspecified atrial fibrillation: Secondary | ICD-10-CM

## 2018-08-05 LAB — POCT INR: INR: 2.8 (ref 2.0–3.0)

## 2018-08-05 NOTE — Patient Instructions (Addendum)
Pre visit review using our clinic review tool, if applicable. No additional management support is needed unless otherwise documented below in the visit note.  Continue to take 1/2 tablet daily except for 1 tablet on Monday and Fridays.  Re-check in 4 weeks.

## 2018-08-09 ENCOUNTER — Other Ambulatory Visit: Payer: Self-pay

## 2018-08-09 ENCOUNTER — Other Ambulatory Visit (INDEPENDENT_AMBULATORY_CARE_PROVIDER_SITE_OTHER): Payer: MEDICARE

## 2018-08-09 ENCOUNTER — Encounter: Payer: Self-pay | Admitting: Internal Medicine

## 2018-08-09 ENCOUNTER — Ambulatory Visit (INDEPENDENT_AMBULATORY_CARE_PROVIDER_SITE_OTHER): Payer: MEDICARE | Admitting: Internal Medicine

## 2018-08-09 ENCOUNTER — Ambulatory Visit (INDEPENDENT_AMBULATORY_CARE_PROVIDER_SITE_OTHER)
Admission: RE | Admit: 2018-08-09 | Discharge: 2018-08-09 | Disposition: A | Discharge status: Home / Self Care | Payer: MEDICARE | Source: Ambulatory Visit | Attending: Internal Medicine | Admitting: Internal Medicine

## 2018-08-09 VITALS — BP 118/72 | HR 80 | Temp 97.5°F | Ht 69.0 in | Wt 209.0 lb

## 2018-08-09 DIAGNOSIS — I2510 Atherosclerotic heart disease of native coronary artery without angina pectoris: Secondary | ICD-10-CM

## 2018-08-09 DIAGNOSIS — Z7901 Long term (current) use of anticoagulants: Secondary | ICD-10-CM

## 2018-08-09 DIAGNOSIS — I4891 Unspecified atrial fibrillation: Secondary | ICD-10-CM | POA: Diagnosis not present

## 2018-08-09 DIAGNOSIS — E785 Hyperlipidemia, unspecified: Secondary | ICD-10-CM | POA: Diagnosis not present

## 2018-08-09 DIAGNOSIS — C3491 Malignant neoplasm of unspecified part of right bronchus or lung: Secondary | ICD-10-CM

## 2018-08-09 DIAGNOSIS — N320 Bladder-neck obstruction: Secondary | ICD-10-CM

## 2018-08-09 DIAGNOSIS — I10 Essential (primary) hypertension: Secondary | ICD-10-CM

## 2018-08-09 LAB — URINALYSIS
Bilirubin Urine: NEGATIVE
Ketones, ur: NEGATIVE
Leukocytes,Ua: NEGATIVE
Nitrite: NEGATIVE
Specific Gravity, Urine: 1.025 (ref 1.000–1.030)
Total Protein, Urine: NEGATIVE
Urine Glucose: NEGATIVE
Urobilinogen, UA: 0.2 (ref 0.0–1.0)
pH: 6 (ref 5.0–8.0)

## 2018-08-09 LAB — BASIC METABOLIC PANEL WITH GFR
BUN: 17 mg/dL (ref 6–23)
CO2: 28 meq/L (ref 19–32)
Calcium: 8.9 mg/dL (ref 8.4–10.5)
Chloride: 105 meq/L (ref 96–112)
Creatinine, Ser: 1.05 mg/dL (ref 0.40–1.50)
GFR: 67.36 mL/min
Glucose, Bld: 91 mg/dL (ref 70–99)
Potassium: 4.2 meq/L (ref 3.5–5.1)
Sodium: 140 meq/L (ref 135–145)

## 2018-08-09 LAB — CBC WITH DIFFERENTIAL/PLATELET
Basophils Absolute: 0 10*3/uL (ref 0.0–0.1)
Basophils Relative: 0.7 % (ref 0.0–3.0)
Eosinophils Absolute: 0.1 10*3/uL (ref 0.0–0.7)
Eosinophils Relative: 0.9 % (ref 0.0–5.0)
HCT: 43.4 % (ref 39.0–52.0)
Hemoglobin: 14.3 g/dL (ref 13.0–17.0)
Lymphocytes Relative: 34.5 % (ref 12.0–46.0)
Lymphs Abs: 2.2 10*3/uL (ref 0.7–4.0)
MCHC: 32.9 g/dL (ref 30.0–36.0)
MCV: 93.1 fl (ref 78.0–100.0)
Monocytes Absolute: 0.5 10*3/uL (ref 0.1–1.0)
Monocytes Relative: 7.3 % (ref 3.0–12.0)
Neutro Abs: 3.7 10*3/uL (ref 1.4–7.7)
Neutrophils Relative %: 56.6 % (ref 43.0–77.0)
Platelets: 126 10*3/uL — ABNORMAL LOW (ref 150.0–400.0)
RBC: 4.67 Mil/uL (ref 4.22–5.81)
RDW: 14.6 % (ref 11.5–15.5)
WBC: 6.5 10*3/uL (ref 4.0–10.5)

## 2018-08-09 LAB — HEPATIC FUNCTION PANEL
ALT: 15 U/L (ref 0–53)
AST: 17 U/L (ref 0–37)
Albumin: 4.2 g/dL (ref 3.5–5.2)
Alkaline Phosphatase: 34 U/L — ABNORMAL LOW (ref 39–117)
Bilirubin, Direct: 0.2 mg/dL (ref 0.0–0.3)
Total Bilirubin: 0.9 mg/dL (ref 0.2–1.2)
Total Protein: 6.4 g/dL (ref 6.0–8.3)

## 2018-08-09 LAB — LIPID PANEL
Cholesterol: 105 mg/dL (ref 0–200)
HDL: 36.8 mg/dL — ABNORMAL LOW (ref >39.00)
LDL Cholesterol: 55 mg/dL (ref 0–99)
NonHDL: 68.23
Total CHOL/HDL Ratio: 3
Triglycerides: 64 mg/dL (ref 0.0–149.0)
VLDL: 12.8 mg/dL (ref 0.0–40.0)

## 2018-08-09 LAB — PSA: PSA: 0.68 ng/mL (ref 0.10–4.00)

## 2018-08-09 NOTE — Progress Notes (Addendum)
Subjective:  Patient ID: Bernard [name], male    DOB: 1932-04-16  Age: 85 y.o. MRN: [id number]  CC: No chief complaint on file.   HPI RAMIRO PANGILINAN presents for BPH, A fib, h/o R lung CA  Outpatient Medications Prior to Visit  Medication Sig Dispense Refill  . cholecalciferol (VITAMIN D) 1000 UNITS tablet Take 1,000 Units by mouth daily.    . diazepam (VALIUM) 5 MG tablet Take 1 tablet (5 mg total) by mouth 2 (two) times daily. 180 tablet 1  . finasteride (PROSCAR) 5 MG tablet Take 1 tablet (5 mg total) by mouth daily. 90 tablet 3  . isometheptene-acetaminophen-dichloralphenazone (MIDRIN) 65-100-325 MG capsule Take 1 capsule by mouth 4 (four) times daily as needed for migraine or headache. Maximum 5 capsules in 12 hours for migraine headaches, 8 capsules in 24 hours for tension headaches.    . multivitamin-lutein (OCUVITE-LUTEIN) CAPS Take 1 capsule by mouth daily.    . nitroGLYCERIN (NITROSTAT) 0.4 MG SL tablet Place 1 tablet (0.4 mg total) under the tongue every 5 (five) minutes as needed for chest pain (3 DOSES MAX). 25 tablet 2  . omeprazole (PRILOSEC) 20 MG capsule Take 20 mg by mouth daily as needed (heartburn or acid reflux).    . simvastatin (ZOCOR) 20 MG tablet Take 1 tablet (20 mg total) by mouth daily. 90 tablet 3  . terazosin (HYTRIN) 5 MG capsule Take 1 capsule (5 mg total) by mouth at bedtime. 90 capsule 3  . VOLTAREN 1 % GEL Apply 1 application topically 2 (two) times daily. 300 g 3  . warfarin (COUMADIN) 5 MG tablet Take as directed by anticoagulation clinic. 90 tablet 3   No facility-administered medications prior to visit.     ROS: Review of Systems  Constitutional: Negative for appetite change, fatigue and unexpected weight change.  HENT: Negative for congestion, nosebleeds, sneezing, sore throat and trouble swallowing.   Eyes: Negative for itching and visual disturbance.  Respiratory: Negative for cough.   Cardiovascular: Negative for chest pain, palpitations  and leg swelling.  Gastrointestinal: Negative for abdominal distention, blood in stool, diarrhea and nausea.  Genitourinary: Negative for frequency and hematuria.  Musculoskeletal: Positive for arthralgias. Negative for back pain, gait problem, joint swelling and neck pain.  Skin: Negative for rash.  Neurological: Negative for dizziness, tremors, speech difficulty and weakness.  Psychiatric/Behavioral: Negative for agitation, dysphoric mood and sleep disturbance. The patient is not nervous/anxious.     Objective:  BP 118/72 (BP Location: Left Arm, Patient Position: Sitting, Cuff Size: Large)   Pulse 80   Temp (!) 97.5 F (36.4 C) (Oral)   Ht 5\' 9"  (1.753 m)   Wt 209 lb (94.8 kg)   SpO2 97%   BMI 30.86 kg/m   BP Readings from Last 3 Encounters:  08/09/18 118/72  02/03/18 120/78  12/02/17 112/68    Wt Readings from Last 3 Encounters:  08/09/18 209 lb (94.8 kg)  02/03/18 208 lb (94.3 kg)  12/02/17 205 lb (93 kg)    Physical Exam Constitutional:      General: He is not in acute distress.    Appearance: He is well-developed.     Comments: NAD  Eyes:     Conjunctiva/sclera: Conjunctivae normal.     Pupils: Pupils are equal, round, and reactive to light.  Neck:     Musculoskeletal: Normal range of motion.     Thyroid: No thyromegaly.     Vascular: No JVD.  Cardiovascular:  Rate and Rhythm: Normal rate. Rhythm irregular.     Heart sounds: Normal heart sounds. No murmur. No friction rub. No gallop.   Pulmonary:     Effort: Pulmonary effort is normal. No respiratory distress.     Breath sounds: Normal breath sounds. No wheezing or rales.  Chest:     Chest wall: No tenderness.  Abdominal:     General: Bowel sounds are normal. There is no distension.     Palpations: Abdomen is soft. There is no mass.     Tenderness: There is no abdominal tenderness. There is no guarding or rebound.  Musculoskeletal: Normal range of motion.        General: No tenderness.   Lymphadenopathy:     Cervical: No cervical adenopathy.  Skin:    General: Skin is warm and dry.     Findings: No rash.  Neurological:     Mental Status: He is alert and oriented to person, place, and time.     Cranial Nerves: No cranial nerve deficit.     Motor: No abnormal muscle tone.     Coordination: Coordination normal.     Gait: Gait normal.     Deep Tendon Reflexes: Reflexes are normal and symmetric.  Psychiatric:        Behavior: Behavior normal.        Thought Content: Thought content normal.        Judgment: Judgment normal.     Lab Results  Component Value Date   WBC 7.2 02/03/2018   HGB 14.8 02/03/2018   HCT 44.7 02/03/2018   PLT 131.0 (L) 02/03/2018   GLUCOSE 105 (H) 02/03/2018   CHOL 115 02/03/2018   TRIG 76.0 02/03/2018   HDL 38.80 (L) 02/03/2018   LDLCALC 61 02/03/2018   ALT 13 02/03/2018   AST 18 02/03/2018   NA 140 02/03/2018   K 4.2 02/03/2018   CL 105 02/03/2018   CREATININE 1.12 02/03/2018   BUN 19 02/03/2018   CO2 29 02/03/2018   TSH 3.58 02/03/2018   PSA 2.06 04/22/2009   INR 2.8 08/05/2018   HGBA1C (H) 10/23/2009    6.0 (NOTE)                                                                       According to the ADA Clinical Practice Recommendations for 2011, when HbA1c is used as a screening test:   >=6.5%   Diagnostic of Diabetes Mellitus           (if abnormal result  is confirmed)  5.7-6.4%   Increased risk of developing Diabetes Mellitus  References:Diagnosis and Classification of Diabetes Mellitus,Diabetes ELFY,1017,51(WCHEN 1):S62-S69 and Standards of Medical Care in         Diabetes - 2011,Diabetes Care,2011,34  (Suppl 1):S11-S61.    Dg Chest 2 View  Result Date: 09/13/2017 CLINICAL DATA:  History lung carcinoma. EXAM: CHEST - 2 VIEW COMPARISON:  09/11/2016 FINDINGS: Stable top-normal heart size. Stable evidence of prior right middle lobectomy. There is no evidence of pulmonary edema, consolidation, pneumothorax, nodule or pleural  fluid. The visualized bony thorax is unremarkable. IMPRESSION: No active cardiopulmonary disease. Electronically Signed   By: Aletta Edouard M.D.   On: 09/13/2017 14:08  Assessment & Plan:    [name], MD

## 2018-08-09 NOTE — Assessment & Plan Note (Signed)
Simvastatin On Coumadin

## 2018-08-09 NOTE — Assessment & Plan Note (Signed)
Coumadin 

## 2018-08-09 NOTE — Assessment & Plan Note (Signed)
CXR

## 2018-08-09 NOTE — Patient Instructions (Signed)
If you have medicare related insurance (such as traditional Medicare, Blue Cross Medicare, United HealthCare Medicare, or similar), Please make an appointment at the scheduling desk with Jill, the Wellness Health Coach, for your Wellness visit in this office, which is a benefit with your insurance.  

## 2018-08-09 NOTE — Assessment & Plan Note (Signed)
Simvastatin

## 2018-08-09 NOTE — Assessment & Plan Note (Signed)
Terazosin

## 2018-09-06 ENCOUNTER — Other Ambulatory Visit: Payer: Self-pay

## 2018-09-06 ENCOUNTER — Ambulatory Visit (INDEPENDENT_AMBULATORY_CARE_PROVIDER_SITE_OTHER): Payer: MEDICARE | Admitting: General Practice

## 2018-09-06 DIAGNOSIS — Z7901 Long term (current) use of anticoagulants: Secondary | ICD-10-CM

## 2018-09-06 LAB — POCT INR: INR: 2.2 (ref 2.0–3.0)

## 2018-09-06 NOTE — Patient Instructions (Addendum)
Pre visit review using our clinic review tool, if applicable. No additional management support is needed unless otherwise documented below in the visit note.  Continue to take 1/2 tablet daily except for 1 tablet on Monday and Fridays.  Re-check in 6 weeks.

## 2018-10-18 ENCOUNTER — Ambulatory Visit: Payer: MEDICARE

## 2018-10-21 ENCOUNTER — Other Ambulatory Visit: Payer: Self-pay

## 2018-10-21 ENCOUNTER — Ambulatory Visit (INDEPENDENT_AMBULATORY_CARE_PROVIDER_SITE_OTHER): Payer: MEDICARE | Admitting: General Practice

## 2018-10-21 DIAGNOSIS — Z7901 Long term (current) use of anticoagulants: Secondary | ICD-10-CM

## 2018-10-21 LAB — POCT INR: INR: 2.8 (ref 2.0–3.0)

## 2018-10-21 NOTE — Patient Instructions (Addendum)
Pre visit review using our clinic review tool, if applicable. No additional management support is needed unless otherwise documented below in the visit note.  Continue to take 1/2 tablet daily except for 1 tablet on Monday and Fridays.  Re-check in 6 weeks.

## 2018-11-14 ENCOUNTER — Telehealth: Payer: Self-pay | Admitting: Internal Medicine

## 2018-11-14 NOTE — Telephone Encounter (Signed)
See message below °

## 2018-11-14 NOTE — Telephone Encounter (Signed)
Bernard [name] w/[facility] [phone] stated she called last week to inform the provider that they are out of Coumadin, and could they substitute Warfarin.  If so they will need a new script.  Please advise

## 2018-11-14 NOTE — Telephone Encounter (Signed)
Bernard [name] w/[facility] [phone] stated she called last week to inform the provider that they are out of Coumadin, and could they substitute Warfarin.  If so they will need a new script.  Please advise.

## 2018-11-15 ENCOUNTER — Other Ambulatory Visit: Payer: Self-pay | Admitting: General Practice

## 2018-11-15 DIAGNOSIS — Z7901 Long term (current) use of anticoagulants: Secondary | ICD-10-CM

## 2018-11-15 MED ORDER — WARFARIN SODIUM 5 MG PO TABS: ORAL_TABLET | ORAL | 3 refills | Status: DC

## 2018-12-02 ENCOUNTER — Other Ambulatory Visit: Payer: Self-pay

## 2018-12-02 ENCOUNTER — Ambulatory Visit (INDEPENDENT_AMBULATORY_CARE_PROVIDER_SITE_OTHER): Payer: MEDICARE | Admitting: General Practice

## 2018-12-02 DIAGNOSIS — I4891 Unspecified atrial fibrillation: Secondary | ICD-10-CM

## 2018-12-02 DIAGNOSIS — Z7901 Long term (current) use of anticoagulants: Secondary | ICD-10-CM | POA: Diagnosis not present

## 2018-12-02 DIAGNOSIS — Z23 Encounter for immunization: Secondary | ICD-10-CM | POA: Diagnosis not present

## 2018-12-02 LAB — POCT INR: INR: 2.7 (ref 2.0–3.0)

## 2018-12-02 NOTE — Patient Instructions (Addendum)
Pre visit review using our clinic review tool, if applicable. No additional management support is needed unless otherwise documented below in the visit note.  Continue to take 1/2 tablet daily except for 1 tablet on Monday and Fridays.  Re-check in 6 weeks.

## 2018-12-04 NOTE — Progress Notes (Signed)
Agree with management.  Binnie Rail, MD

## 2018-12-15 NOTE — Progress Notes (Signed)
Cardiology Office Note Date:  12/20/2018  Patient ID:  Bernard [name], Bernard [name] Feb 02, 1933, MRN [id number] PCP:  Cassandria Anger, MD  Electrophysiologist:  Dr. Rayann Heman     Chief Complaint: annual EP visit  History of Present Illness: Bernard [name] is a 85 y.o. male with history of CAD (Unknown what his anatomy is, reports of an MI 68), lung cancer (s/pR resection 2010), prior stroke, longstanding persistent Afib, HTN.  He comes today to be seen for Dr. Rayann Heman.  Last seen by him Oct 2019, at that visit discussed chronic DOE (worse with inclines) and occasional fatigue.  No changes were made to his regime.  On coumadin for a/c, pt did not want an aggressive approach for his AF.  He is doing well.  He has bad knees and no longer able to walk the way he used to, on the streets/paths, but does walk behind his lawn mower (he points out is self propelled), will ride his stationary bike without SOB/DOE, no CP, palpitations or cardiac awareness.  Last week (Friday) he mowed his grass as usual, was walking back towards the house and felt his legs get weak suddenly and had to sit down, thinking they were going to give out on him.  They were equally weak, not one or the other, no back pain, no CP, he denies any palpitations with this, he was not lightheaded, did not feel like he was going to faint, no other symptoms of any kind.  His legs just felt suddenly weak, no pain.  He sat for a "little while" got up and they felt well, normal again. He reports compliance with his warfarin, no bleeding/signs of bleeding.  Has been sgable with therapeutic INRs and not need for adjustments.    Past Medical History:  Diagnosis Date  . CAD (coronary artery disease)    s/p MI 1976, normal myoview 7/11  . Depressive disorder, not elsewhere classified   . Esophageal reflux   . Headache    migraines occ., none in months.  . History of BPH    benign prostatic hypertrophy  . Labyrinthitis, unspecified   .  Malignant neoplasm middle lobe, bronchus or lung    right lung. Dx 07/27/08, sp/p resection 7/10.   . Osteoarthrosis, unspecified whether generalized or localized, lower leg    knee  . Other and unspecified hyperlipidemia   . Paroxysmal atrial fibrillation (HCC)    postoperative after lung surgery  . Renal cyst, right    being followed  . Stroke [facility])    TIA March 2000     no side effects now  . Unspecified essential hypertension     Past Surgical History:  Procedure Laterality Date  . anthroscopic surgery  '10   right knee \  . BUNIONECTOMY  '89   left foot  . CATARACT EXTRACTION, BILATERAL    . ESOPHAGOGASTRODUODENOSCOPY  05/14/04  . EYE SURGERY     both eyes - 2011 [facility])  . INGUINAL HERNIA REPAIR  '71   left  . right TKR  8/11  . TOTAL KNEE ARTHROPLASTY Left 09/24/2014   Procedure: LEFT TOTAL KNEE ARTHROPLASTY;  Surgeon: Gaynelle Arabian, MD;  Location: [facility];  Service: Orthopedics;  Laterality: Left;  [name] VAT-RML lobectomy      Current Outpatient Medications  Medication Sig Dispense Refill  . cholecalciferol (VITAMIN D) 1000 UNITS tablet Take 1,000 Units by mouth daily.    . diazepam (VALIUM) 5 MG tablet Take 1 tablet (5 mg total)  by mouth 2 (two) times daily. 180 tablet 1  . finasteride (PROSCAR) 5 MG tablet Take 1 tablet (5 mg total) by mouth daily. 90 tablet 3  . multivitamin-lutein (OCUVITE-LUTEIN) CAPS Take 1 capsule by mouth daily.    . nitroGLYCERIN (NITROSTAT) 0.4 MG SL tablet Place 1 tablet (0.4 mg total) under the tongue every 5 (five) minutes as needed for chest pain (3 DOSES MAX). 25 tablet 2  . omeprazole (PRILOSEC) 20 MG capsule Take 20 mg by mouth daily as needed (heartburn or acid reflux).    . simvastatin (ZOCOR) 20 MG tablet Take 1 tablet (20 mg total) by mouth daily. 90 tablet 3  . terazosin (HYTRIN) 5 MG capsule Take 5 mg by mouth every other day.    . VOLTAREN 1 % GEL Apply 1 application topically 2 (two) times daily. 300 g 3  . warfarin (COUMADIN) 5 MG  tablet Take 1 tablet daily or Take as directed by anticoagulation clinic. 90 tablet 3   No current facility-administered medications for this visit.     Allergies:   Patient has no known allergies.   Social History:  The patient  reports that he has never smoked. He has never used smokeless tobacco. He reports that he does not drink alcohol or use drugs.   Family History:  The patient's family history includes Diabetes insipidus in his unknown relative.  ROS:  Please see the history of present illness. All other systems are reviewed and otherwise negative.   PHYSICAL EXAM:  VS:  BP 140/90   Pulse 82   Ht 5\' 9"  (1.753 m)   Wt 208 lb (94.3 kg)   SpO2 99%   BMI 30.72 kg/m  BMI: Body mass index is 30.72 kg/m. Well nourished, well developed, in no acute distress  HEENT: normocephalic, atraumatic  Neck: no JVD, carotid bruits or masses Cardiac:  irreg-irreg,  no significant murmurs, no rubs, or gallops Lungs: CTA b/l, no wheezing, rhonchi or rales  Abd: soft, nontender MS: no deformity or atrophy Ext: trace edema.  He has good pedal pulses b/l, no skin changes, feet are warm, no cyanosis Skin: warm and dry, no rash Neuro:  No gross deficits appreciated Psych: euthymic mood, full affect     EKG:  Done today and reviewed by myself shows AFib 82bpm, no changes from prior   10/25/14: TTE Study Conclusions - Left ventricle: The cavity size was normal. Systolic function was   normal. The estimated ejection fraction was in the range of 55%   to 60%. Wall motion was normal; there were no regional wall   motion abnormalities. There was an increased relative   contribution of atrial contraction to ventricular filling.   Doppler parameters are consistent with abnormal left ventricular   relaxation (grade 1 diastolic dysfunction). - Aortic valve: Trileaflet; normal thickness, mildly calcified   leaflets. - Mitral valve: There was mild regurgitation. - Right ventricle: The cavity size  was mildly dilated. Wall   thickness was normal. - Atrial septum: There was increased thickness of the septum,   consistent with lipomatous hypertrophy.   Recent Labs: 02/03/2018: TSH 3.58 08/09/2018: ALT 15; BUN 17; Creatinine, Ser 1.05; Hemoglobin 14.3; Platelets 126.0; Potassium 4.2; Sodium 140  08/09/2018: Cholesterol 105; HDL 36.80; LDL Cholesterol 55; Total CHOL/HDL Ratio 3; Triglycerides 64.0; VLDL 12.8   CrCl cannot be calculated (Patient's most recent lab result is older than the maximum 21 days allowed.).   Wt Readings from Last 3 Encounters:  12/20/18 208 lb (  94.3 kg)  08/09/18 209 lb (94.8 kg)  02/03/18 208 lb (94.3 kg)     Other studies reviewed: Additional studies/records reviewed today include: summarized above  ASSESSMENT AND PLAN:  1. Longstanding persistent AFib >> permanent     Noted last visit pt did not want any aggressive menagement of his AF     CHA2DS2Vasc is 4, on warfarin, monitored and managed with his PMD     Unaware of his afib, rate controlled currently  2. HTN     He says today is unusually high for him     He is asked to continue to keep an eye on it at home     Tennis Must denies any significant sodium intake  3. HLD     Monitored and managed by his PMD     Looked good in June  4. CAD      No anginal sounding complaints  5. ? Episode of b/l leg weakness      Did not sound of HR/rhythm etiology      Did not localize to one side or the other, no other neuro symptoms      Suggest he discuss with his PMD, monitor for any recurrence    Disposition: F/u with [place] in 1 year again, sooner if needed  Current medicines are reviewed at length with the patient today.  The patient did not have any concerns regarding medicines.  Venetia Night, PA-C 12/20/2018 11:32 AM     [address] [place] [address] [phone] (office)  [phone] (fax)

## 2018-12-20 ENCOUNTER — Other Ambulatory Visit: Payer: Self-pay

## 2018-12-20 ENCOUNTER — Encounter: Payer: Self-pay | Admitting: Physician Assistant

## 2018-12-20 ENCOUNTER — Ambulatory Visit (INDEPENDENT_AMBULATORY_CARE_PROVIDER_SITE_OTHER): Payer: MEDICARE | Admitting: Physician Assistant

## 2018-12-20 VITALS — BP 140/90 | HR 82 | Ht 69.0 in | Wt 208.0 lb

## 2018-12-20 DIAGNOSIS — I10 Essential (primary) hypertension: Secondary | ICD-10-CM | POA: Diagnosis not present

## 2018-12-20 DIAGNOSIS — E785 Hyperlipidemia, unspecified: Secondary | ICD-10-CM

## 2018-12-20 DIAGNOSIS — I4811 Longstanding persistent atrial fibrillation: Secondary | ICD-10-CM | POA: Diagnosis not present

## 2018-12-20 DIAGNOSIS — I2510 Atherosclerotic heart disease of native coronary artery without angina pectoris: Secondary | ICD-10-CM | POA: Diagnosis not present

## 2018-12-20 NOTE — Patient Instructions (Signed)
Medication Instructions:  Your physician recommends that you continue on your current medications as directed. Please refer to the Current Medication list given to you today.  *If you need a refill on your cardiac medications before your next appointment, please call your pharmacy*  Lab Work: NONE If you have labs (blood work) drawn today and your tests are completely normal, you will receive your results only by: [name] MyChart Message (if you have MyChart) OR . A paper copy in the mail If you have any lab test that is abnormal or we need to change your treatment, we will call you to review the results.  Testing/Procedures: NONE  Follow-Up: At [facility], you and your health needs are our priority.  As part of our continuing mission to provide you with exceptional heart care, we have created designated Provider Care Teams.  These Care Teams include your primary Cardiologist (physician) and Advanced Practice Providers (APPs -  Physician Assistants and Nurse Practitioners) who all work together to provide you with the care you need, when you need it.  Your next appointment:   12 months  The format for your next appointment:   In Person  Provider:   Tommye Standard, PA-C

## 2019-01-13 ENCOUNTER — Other Ambulatory Visit: Payer: Self-pay

## 2019-01-13 ENCOUNTER — Ambulatory Visit (INDEPENDENT_AMBULATORY_CARE_PROVIDER_SITE_OTHER): Payer: MEDICARE | Admitting: General Practice

## 2019-01-13 DIAGNOSIS — Z7901 Long term (current) use of anticoagulants: Secondary | ICD-10-CM | POA: Diagnosis not present

## 2019-01-13 LAB — POCT INR: INR: 2.9 (ref 2.0–3.0)

## 2019-01-13 NOTE — Progress Notes (Signed)
Medical screening examination/treatment/procedure(s) were performed by non-physician practitioner and as supervising physician I was immediately available for consultation/collaboration. I agree with above. James John, MD   

## 2019-01-13 NOTE — Patient Instructions (Addendum)
Pre visit review using our clinic review tool, if applicable. No additional management support is needed unless otherwise documented below in the visit note.  Continue to take 1/2 tablet daily except for 1 tablet on Monday and Fridays.  Re-check in 6 weeks.

## 2019-01-16 ENCOUNTER — Other Ambulatory Visit: Payer: Self-pay

## 2019-02-08 ENCOUNTER — Encounter: Payer: Self-pay | Admitting: Internal Medicine

## 2019-02-08 ENCOUNTER — Other Ambulatory Visit: Payer: Self-pay

## 2019-02-08 ENCOUNTER — Ambulatory Visit (INDEPENDENT_AMBULATORY_CARE_PROVIDER_SITE_OTHER): Payer: MEDICARE | Admitting: Internal Medicine

## 2019-02-08 ENCOUNTER — Other Ambulatory Visit (INDEPENDENT_AMBULATORY_CARE_PROVIDER_SITE_OTHER): Payer: MEDICARE

## 2019-02-08 DIAGNOSIS — I679 Cerebrovascular disease, unspecified: Secondary | ICD-10-CM

## 2019-02-08 DIAGNOSIS — Z7901 Long term (current) use of anticoagulants: Secondary | ICD-10-CM

## 2019-02-08 DIAGNOSIS — I480 Paroxysmal atrial fibrillation: Secondary | ICD-10-CM | POA: Diagnosis not present

## 2019-02-08 DIAGNOSIS — E785 Hyperlipidemia, unspecified: Secondary | ICD-10-CM

## 2019-02-08 DIAGNOSIS — I2510 Atherosclerotic heart disease of native coronary artery without angina pectoris: Secondary | ICD-10-CM | POA: Diagnosis not present

## 2019-02-08 LAB — LIPID PANEL
Cholesterol: 120 mg/dL (ref 0–200)
HDL: 37.6 mg/dL — ABNORMAL LOW (ref >39.00)
LDL Cholesterol: 66 mg/dL (ref 0–99)
NonHDL: 82.83
Total CHOL/HDL Ratio: 3
Triglycerides: 84 mg/dL (ref 0.0–149.0)
VLDL: 16.8 mg/dL (ref 0.0–40.0)

## 2019-02-08 LAB — BASIC METABOLIC PANEL
BUN: 19 mg/dL (ref 6–23)
CO2: 29 mEq/L (ref 19–32)
Calcium: 9.3 mg/dL (ref 8.4–10.5)
Chloride: 106 mEq/L (ref 96–112)
Creatinine, Ser: 1.04 mg/dL (ref 0.40–1.50)
GFR: 68.03 mL/min (ref >60.00)
Glucose, Bld: 103 mg/dL — ABNORMAL HIGH (ref 70–99)
Potassium: 4 mEq/L (ref 3.5–5.1)
Sodium: 140 mEq/L (ref 135–145)

## 2019-02-08 LAB — HEPATIC FUNCTION PANEL
ALT: 16 U/L (ref 0–53)
AST: 19 U/L (ref 0–37)
Albumin: 4.1 g/dL (ref 3.5–5.2)
Alkaline Phosphatase: 38 U/L — ABNORMAL LOW (ref 39–117)
Bilirubin, Direct: 0.2 mg/dL (ref 0.0–0.3)
Total Bilirubin: 0.7 mg/dL (ref 0.2–1.2)
Total Protein: 6.7 g/dL (ref 6.0–8.3)

## 2019-02-08 MED ORDER — WARFARIN SODIUM 5 MG PO TABS: ORAL_TABLET | ORAL | 3 refills | Status: DC

## 2019-02-08 MED ORDER — FINASTERIDE 5 MG PO TABS: 5.0000 mg | ORAL_TABLET | Freq: Every day | ORAL | 3 refills | Status: DC

## 2019-02-08 MED ORDER — DIAZEPAM 5 MG PO TABS: 5.0000 mg | ORAL_TABLET | Freq: Two times a day (BID) | ORAL | 1 refills | Status: DC

## 2019-02-08 MED ORDER — NITROGLYCERIN 0.4 MG SL SUBL: 0.4000 mg | SUBLINGUAL_TABLET | SUBLINGUAL | 2 refills | Status: DC | PRN

## 2019-02-08 MED ORDER — TERAZOSIN HCL 5 MG PO CAPS: 5.0000 mg | ORAL_CAPSULE | Freq: Every day | ORAL | 3 refills | Status: DC

## 2019-02-08 MED ORDER — SIMVASTATIN 20 MG PO TABS: 20.0000 mg | ORAL_TABLET | Freq: Every day | ORAL | 3 refills | Status: DC

## 2019-02-08 NOTE — Assessment & Plan Note (Signed)
NTG prn On Simvastatin Last Myoview JUly '11 - stable

## 2019-02-08 NOTE — Assessment & Plan Note (Signed)
On Coumadin 

## 2019-02-08 NOTE — Progress Notes (Signed)
Subjective:  Patient ID: Bernard [name], male    DOB: 1932/08/13  Age: 85 y.o. MRN: [id number]  CC: No chief complaint on file.   HPI Bernard [name] presents for A fib, CVA,  GERD f/u  Outpatient Medications Prior to Visit  Medication Sig Dispense Refill  . cholecalciferol (VITAMIN D) 1000 UNITS tablet Take 1,000 Units by mouth daily.    . diazepam (VALIUM) 5 MG tablet Take 1 tablet (5 mg total) by mouth 2 (two) times daily. 180 tablet 1  . finasteride (PROSCAR) 5 MG tablet Take 1 tablet (5 mg total) by mouth daily. 90 tablet 3  . multivitamin-lutein (OCUVITE-LUTEIN) CAPS Take 1 capsule by mouth daily.    . nitroGLYCERIN (NITROSTAT) 0.4 MG SL tablet Place 1 tablet (0.4 mg total) under the tongue every 5 (five) minutes as needed for chest pain (3 DOSES MAX). 25 tablet 2  . omeprazole (PRILOSEC) 20 MG capsule Take 20 mg by mouth daily as needed (heartburn or acid reflux).    . simvastatin (ZOCOR) 20 MG tablet Take 1 tablet (20 mg total) by mouth daily. 90 tablet 3  . terazosin (HYTRIN) 5 MG capsule Take 5 mg by mouth every other day.    . VOLTAREN 1 % GEL Apply 1 application topically 2 (two) times daily. 300 g 3  . warfarin (COUMADIN) 5 MG tablet Take 1 tablet daily or Take as directed by anticoagulation clinic. 90 tablet 3   No facility-administered medications prior to visit.    ROS: Review of Systems  Constitutional: Negative for appetite change, fatigue and unexpected weight change.  HENT: Negative for congestion, nosebleeds, sneezing, sore throat and trouble swallowing.   Eyes: Negative for itching and visual disturbance.  Respiratory: Negative for cough.   Cardiovascular: Negative for chest pain, palpitations and leg swelling.  Gastrointestinal: Negative for abdominal distention, blood in stool, diarrhea and nausea.  Genitourinary: Negative for frequency and hematuria.  Musculoskeletal: Positive for arthralgias. Negative for back pain, gait problem, joint swelling and neck  pain.  Skin: Negative for rash.  Neurological: Negative for dizziness, tremors, speech difficulty and weakness.  Psychiatric/Behavioral: Negative for agitation, dysphoric mood and sleep disturbance. The patient is not nervous/anxious.     Objective:  BP 130/74 (BP Location: Left Arm, Patient Position: Sitting, Cuff Size: Large)   Pulse 84   Temp (!) 97.4 F (36.3 C) (Oral)   Ht 5\' 9"  (1.753 m)   Wt 207 lb (93.9 kg)   SpO2 96%   BMI 30.57 kg/m   BP Readings from Last 3 Encounters:  02/08/19 130/74  12/20/18 140/90  08/09/18 118/72    Wt Readings from Last 3 Encounters:  02/08/19 207 lb (93.9 kg)  12/20/18 208 lb (94.3 kg)  08/09/18 209 lb (94.8 kg)    Physical Exam Constitutional:      General: He is not in acute distress.    Appearance: He is well-developed. He is obese.     Comments: NAD  Eyes:     Conjunctiva/sclera: Conjunctivae normal.     Pupils: Pupils are equal, round, and reactive to light.  Neck:     Thyroid: No thyromegaly.     Vascular: No JVD.  Cardiovascular:     Rate and Rhythm: Normal rate. Rhythm irregular.     Heart sounds: Normal heart sounds. No murmur. No friction rub. No gallop.   Pulmonary:     Effort: Pulmonary effort is normal. No respiratory distress.     Breath sounds: Normal breath  sounds. No wheezing or rales.  Chest:     Chest wall: No tenderness.  Abdominal:     General: Bowel sounds are normal. There is no distension.     Palpations: Abdomen is soft. There is no mass.     Tenderness: There is no abdominal tenderness. There is no guarding or rebound.  Musculoskeletal:        General: No tenderness. Normal range of motion.     Cervical back: Normal range of motion.  Lymphadenopathy:     Cervical: No cervical adenopathy.  Skin:    General: Skin is warm and dry.     Findings: No rash.  Neurological:     Mental Status: He is alert and oriented to person, place, and time.     Cranial Nerves: No cranial nerve deficit.     Motor:  No abnormal muscle tone.     Coordination: Coordination normal.     Gait: Gait normal.     Deep Tendon Reflexes: Reflexes are normal and symmetric.  Psychiatric:        Behavior: Behavior normal.        Thought Content: Thought content normal.        Judgment: Judgment normal.     Lab Results  Component Value Date   WBC 6.5 08/09/2018   HGB 14.3 08/09/2018   HCT 43.4 08/09/2018   PLT 126.0 (L) 08/09/2018   GLUCOSE 91 08/09/2018   CHOL 105 08/09/2018   TRIG 64.0 08/09/2018   HDL 36.80 (L) 08/09/2018   LDLCALC 55 08/09/2018   ALT 15 08/09/2018   AST 17 08/09/2018   NA 140 08/09/2018   K 4.2 08/09/2018   CL 105 08/09/2018   CREATININE 1.05 08/09/2018   BUN 17 08/09/2018   CO2 28 08/09/2018   TSH 3.58 02/03/2018   PSA 0.68 08/09/2018   INR 2.9 01/13/2019   HGBA1C (H) 10/23/2009    6.0 (NOTE)                                                                       According to the ADA Clinical Practice Recommendations for 2011, when HbA1c is used as a screening test:   >=6.5%   Diagnostic of Diabetes Mellitus           (if abnormal result  is confirmed)  5.7-6.4%   Increased risk of developing Diabetes Mellitus  References:Diagnosis and Classification of Diabetes Mellitus,Diabetes UEAV,4098,11(BJYNW 1):S62-S69 and Standards of Medical Care in         Diabetes - 2011,Diabetes Care,2011,34  (Suppl 1):S11-S61.    DG Chest 2 View  Result Date: 08/09/2018 CLINICAL DATA:  No chest complaints. EXAM: CHEST - 2 VIEW COMPARISON:  Chest radiograph 09/13/2017 FINDINGS: Stable enlarged cardiac and mediastinal contours. Similar-appearing bibasilar linear opacities, likely representing scarring. Elevation right hemidiaphragm. Thoracic spine degenerative changes. No pleural effusion or pneumothorax. IMPRESSION: No acute cardiopulmonary process. Electronically Signed   By: Lovey Newcomer M.D.   On: 08/09/2018 17:23    Assessment & Plan:   Diagnoses and all orders for this visit:  Paroxysmal  atrial fibrillation ([place])  CEREBROVASCULAR DISEASE  Atherosclerosis of native coronary artery of native heart without angina pectoris  Dyslipidemia     No orders  of the defined types were placed in this encounter.    Follow-up: No follow-ups on file.  [name], MD

## 2019-02-08 NOTE — Assessment & Plan Note (Signed)
On simvastatin 20 mg daily

## 2019-02-08 NOTE — Patient Instructions (Signed)
If you have medicare related insurance (such as traditional Medicare, Blue Cross Medicare, United HealthCare Medicare, or similar), Please make an appointment at the scheduling desk with Jill, the Wellness Health Coach, for your Wellness visit in this office, which is a benefit with your insurance.  

## 2019-03-03 ENCOUNTER — Ambulatory Visit (INDEPENDENT_AMBULATORY_CARE_PROVIDER_SITE_OTHER): Payer: MEDICARE | Admitting: General Practice

## 2019-03-03 ENCOUNTER — Other Ambulatory Visit: Payer: Self-pay

## 2019-03-03 ENCOUNTER — Ambulatory Visit: Payer: Self-pay | Admitting: General Practice

## 2019-03-03 DIAGNOSIS — Z7901 Long term (current) use of anticoagulants: Secondary | ICD-10-CM

## 2019-03-03 DIAGNOSIS — I4891 Unspecified atrial fibrillation: Secondary | ICD-10-CM

## 2019-03-03 LAB — POCT INR
INR: 2.4 (ref 2.0–3.0)
INR: 2.4 (ref 2.0–3.0)

## 2019-03-03 NOTE — Patient Instructions (Addendum)
.  lbpcmh  Continue to take 1/2 tablet daily except for 1 tablet on Monday and Fridays.  Re-check in 6 weeks.

## 2019-03-03 NOTE — Progress Notes (Signed)
Medical screening examination/treatment/procedure(s) were performed by non-physician practitioner and as supervising physician I was immediately available for consultation/collaboration. I agree with above. Tawanda Schall, MD   

## 2019-03-18 ENCOUNTER — Ambulatory Visit: Payer: MEDICARE

## 2019-03-20 ENCOUNTER — Ambulatory Visit: Payer: MEDICARE | Attending: Internal Medicine

## 2019-03-20 DIAGNOSIS — Z23 Encounter for immunization: Secondary | ICD-10-CM | POA: Insufficient documentation

## 2019-03-20 NOTE — Progress Notes (Signed)
   Covid-19 Vaccination Clinic  Name:  THONG FEENY    MRN: [id number] DOB: 29-Sep-1932  03/20/2019  Bernard [name] was observed post Covid-19 immunization for 15 minutes without incidence. He was provided with Vaccine Information Sheet and instruction to access the V-Safe system.   Mr. Oommen was instructed to call 911 with any severe reactions post vaccine: [name] Difficulty breathing  . Swelling of your face and throat  . A fast heartbeat  . A bad rash all over your body  . Dizziness and weakness    Immunizations Administered    Name Date Dose VIS Date Route   Pfizer COVID-19 Vaccine 03/20/2019 12:55 PM 0.3 mL 02/03/2019 Intramuscular   Manufacturer: [place]   Lot: [id number]   [place]: [id number]

## 2019-04-10 ENCOUNTER — Ambulatory Visit: Payer: MEDICARE | Attending: Internal Medicine

## 2019-04-10 DIAGNOSIS — Z23 Encounter for immunization: Secondary | ICD-10-CM | POA: Insufficient documentation

## 2019-04-10 NOTE — Progress Notes (Signed)
   Covid-19 Vaccination Clinic  Name:  Bernard [name]    MRN: [id number] DOB: Sep 26, 1932  04/10/2019  Bernard [name] was observed post Covid-19 immunization for 15 minutes without incidence. He was provided with Vaccine Information Sheet and instruction to access the V-Safe system.   Bernard [name] was instructed to call 911 with any severe reactions post vaccine: [name] Difficulty breathing  . Swelling of your face and throat  . A fast heartbeat  . A bad rash all over your body  . Dizziness and weakness    Immunizations Administered    Name Date Dose VIS Date Route   Pfizer COVID-19 Vaccine 04/10/2019 11:55 AM 0.3 mL 02/03/2019 Intramuscular   Manufacturer: [place]   Lot: [id number]   [place]: [address]-3

## 2019-04-11 ENCOUNTER — Other Ambulatory Visit: Payer: Self-pay

## 2019-04-11 ENCOUNTER — Ambulatory Visit (INDEPENDENT_AMBULATORY_CARE_PROVIDER_SITE_OTHER): Payer: MEDICARE | Admitting: General Practice

## 2019-04-11 DIAGNOSIS — Z7901 Long term (current) use of anticoagulants: Secondary | ICD-10-CM

## 2019-04-11 LAB — POCT INR: INR: 2.9 (ref 2.0–3.0)

## 2019-04-11 NOTE — Patient Instructions (Addendum)
Pre visit review using our clinic review tool, if applicable. No additional management support is needed unless otherwise documented below in the visit note.  Continue to take 1/2 tablet daily except for 1 tablet on Monday and Fridays.  Re-check in 6 weeks.

## 2019-05-23 ENCOUNTER — Other Ambulatory Visit: Payer: Self-pay

## 2019-05-23 ENCOUNTER — Ambulatory Visit (INDEPENDENT_AMBULATORY_CARE_PROVIDER_SITE_OTHER): Payer: MEDICARE | Admitting: General Practice

## 2019-05-23 DIAGNOSIS — Z7901 Long term (current) use of anticoagulants: Secondary | ICD-10-CM

## 2019-05-23 LAB — POCT INR: INR: 2.3 (ref 2.0–3.0)

## 2019-05-23 NOTE — Patient Instructions (Addendum)
Pre visit review using our clinic review tool, if applicable. No additional management support is needed unless otherwise documented below in the visit note.  Continue to take 1/2 tablet daily except for 1 tablet on Monday and Fridays.  Re-check in 6 weeks.

## 2019-07-04 ENCOUNTER — Other Ambulatory Visit: Payer: Self-pay

## 2019-07-04 ENCOUNTER — Ambulatory Visit (INDEPENDENT_AMBULATORY_CARE_PROVIDER_SITE_OTHER): Payer: MEDICARE | Admitting: General Practice

## 2019-07-04 DIAGNOSIS — Z7901 Long term (current) use of anticoagulants: Secondary | ICD-10-CM

## 2019-07-04 LAB — POCT INR: INR: 2.5 (ref 2.0–3.0)

## 2019-07-04 NOTE — Patient Instructions (Addendum)
Pre visit review using our clinic review tool, if applicable. No additional management support is needed unless otherwise documented below in the visit note.  Continue to take 1/2 tablet daily except for 1 tablet on Monday and Fridays.  Re-check in 3 weeks.

## 2019-07-25 ENCOUNTER — Ambulatory Visit (INDEPENDENT_AMBULATORY_CARE_PROVIDER_SITE_OTHER): Payer: MEDICARE | Admitting: General Practice

## 2019-07-25 ENCOUNTER — Other Ambulatory Visit: Payer: Self-pay

## 2019-07-25 DIAGNOSIS — Z7901 Long term (current) use of anticoagulants: Secondary | ICD-10-CM

## 2019-07-25 DIAGNOSIS — I4891 Unspecified atrial fibrillation: Secondary | ICD-10-CM

## 2019-07-25 LAB — POCT INR: INR: 2.8 (ref 2.0–3.0)

## 2019-07-25 NOTE — Patient Instructions (Addendum)
Pre visit review using our clinic review tool, if applicable. No additional management support is needed unless otherwise documented below in the visit note.  Continue to take 1/2 tablet daily except for 1 tablet on Monday and Fridays.  Re-check in 6 weeks.

## 2019-08-09 ENCOUNTER — Ambulatory Visit (INDEPENDENT_AMBULATORY_CARE_PROVIDER_SITE_OTHER): Payer: MEDICARE

## 2019-08-09 ENCOUNTER — Other Ambulatory Visit: Payer: Self-pay

## 2019-08-09 ENCOUNTER — Ambulatory Visit (INDEPENDENT_AMBULATORY_CARE_PROVIDER_SITE_OTHER): Payer: MEDICARE | Admitting: Internal Medicine

## 2019-08-09 ENCOUNTER — Encounter: Payer: Self-pay | Admitting: Internal Medicine

## 2019-08-09 VITALS — BP 130/82 | HR 80 | Temp 97.9°F | Ht 69.0 in | Wt 209.0 lb

## 2019-08-09 DIAGNOSIS — N320 Bladder-neck obstruction: Secondary | ICD-10-CM | POA: Diagnosis not present

## 2019-08-09 DIAGNOSIS — I10 Essential (primary) hypertension: Secondary | ICD-10-CM | POA: Diagnosis not present

## 2019-08-09 DIAGNOSIS — B079 Viral wart, unspecified: Secondary | ICD-10-CM | POA: Insufficient documentation

## 2019-08-09 DIAGNOSIS — I2510 Atherosclerotic heart disease of native coronary artery without angina pectoris: Secondary | ICD-10-CM

## 2019-08-09 DIAGNOSIS — I4891 Unspecified atrial fibrillation: Secondary | ICD-10-CM | POA: Diagnosis not present

## 2019-08-09 DIAGNOSIS — E785 Hyperlipidemia, unspecified: Secondary | ICD-10-CM | POA: Diagnosis not present

## 2019-08-09 DIAGNOSIS — C3491 Malignant neoplasm of unspecified part of right bronchus or lung: Secondary | ICD-10-CM

## 2019-08-09 LAB — URINALYSIS
Bilirubin Urine: NEGATIVE
Ketones, ur: NEGATIVE
Leukocytes,Ua: NEGATIVE
Nitrite: NEGATIVE
Specific Gravity, Urine: 1.025 (ref 1.000–1.030)
Total Protein, Urine: NEGATIVE
Urine Glucose: NEGATIVE
Urobilinogen, UA: 0.2 (ref 0.0–1.0)
pH: 6 (ref 5.0–8.0)

## 2019-08-09 LAB — TSH: TSH: 3.04 u[IU]/mL (ref 0.35–4.50)

## 2019-08-09 LAB — CBC WITH DIFFERENTIAL/PLATELET
Basophils Absolute: 0 10*3/uL (ref 0.0–0.1)
Basophils Relative: 0.6 % (ref 0.0–3.0)
Eosinophils Absolute: 0.1 10*3/uL (ref 0.0–0.7)
Eosinophils Relative: 1 % (ref 0.0–5.0)
HCT: 41.8 % (ref 39.0–52.0)
Hemoglobin: 14.1 g/dL (ref 13.0–17.0)
Lymphocytes Relative: 32.1 % (ref 12.0–46.0)
Lymphs Abs: 2 10*3/uL (ref 0.7–4.0)
MCHC: 33.7 g/dL (ref 30.0–36.0)
MCV: 92.5 fl (ref 78.0–100.0)
Monocytes Absolute: 0.5 10*3/uL (ref 0.1–1.0)
Monocytes Relative: 8.2 % (ref 3.0–12.0)
Neutro Abs: 3.7 10*3/uL (ref 1.4–7.7)
Neutrophils Relative %: 58.1 % (ref 43.0–77.0)
Platelets: 114 10*3/uL — ABNORMAL LOW (ref 150.0–400.0)
RBC: 4.52 Mil/uL (ref 4.22–5.81)
RDW: 14.2 % (ref 11.5–15.5)
WBC: 6.4 10*3/uL (ref 4.0–10.5)

## 2019-08-09 LAB — LIPID PANEL
Cholesterol: 110 mg/dL (ref 0–200)
HDL: 39.6 mg/dL (ref >39.00)
LDL Cholesterol: 59 mg/dL (ref 0–99)
NonHDL: 70.48
Total CHOL/HDL Ratio: 3
Triglycerides: 59 mg/dL (ref 0.0–149.0)
VLDL: 11.8 mg/dL (ref 0.0–40.0)

## 2019-08-09 LAB — BASIC METABOLIC PANEL
BUN: 21 mg/dL (ref 6–23)
CO2: 30 mEq/L (ref 19–32)
Calcium: 8.9 mg/dL (ref 8.4–10.5)
Chloride: 107 mEq/L (ref 96–112)
Creatinine, Ser: 1.03 mg/dL (ref 0.40–1.50)
GFR: 68.71 mL/min (ref >60.00)
Glucose, Bld: 96 mg/dL (ref 70–99)
Potassium: 4.3 mEq/L (ref 3.5–5.1)
Sodium: 141 mEq/L (ref 135–145)

## 2019-08-09 LAB — HEPATIC FUNCTION PANEL
ALT: 15 U/L (ref 0–53)
AST: 20 U/L (ref 0–37)
Albumin: 4.3 g/dL (ref 3.5–5.2)
Alkaline Phosphatase: 36 U/L — ABNORMAL LOW (ref 39–117)
Bilirubin, Direct: 0.3 mg/dL (ref 0.0–0.3)
Total Bilirubin: 1 mg/dL (ref 0.2–1.2)
Total Protein: 6.4 g/dL (ref 6.0–8.3)

## 2019-08-09 LAB — PSA: PSA: 0.44 ng/mL (ref 0.10–4.00)

## 2019-08-09 NOTE — Assessment & Plan Note (Signed)
On Coumadin 

## 2019-08-09 NOTE — Assessment & Plan Note (Signed)
CXR

## 2019-08-09 NOTE — Assessment & Plan Note (Signed)
On Coumadin NTG prn On Simvastatin

## 2019-08-09 NOTE — Addendum Note (Signed)
Addended by: Trenda Moots on: 3/81/8403 11:06 AM   Modules accepted: Orders

## 2019-08-09 NOTE — Assessment & Plan Note (Signed)
On [employer]

## 2019-08-09 NOTE — Assessment & Plan Note (Signed)
On Simvastatin 

## 2019-08-09 NOTE — Assessment & Plan Note (Signed)
Behind L ear x1 See Cryo

## 2019-08-09 NOTE — Progress Notes (Addendum)
Subjective:  Patient ID: Bernard [name], male    DOB: 19-May-1932  Age: 86 y.o. MRN: [id number]  CC: No chief complaint on file.   HPI EMETERIO BALKE presents for CAD, GERD, BPH On Coumadin C/o itchy place behind L ear on neck  Outpatient Medications Prior to Visit  Medication Sig Dispense Refill  . cholecalciferol (VITAMIN D) 1000 UNITS tablet Take 1,000 Units by mouth daily.    . diazepam (VALIUM) 5 MG tablet Take 1 tablet (5 mg total) by mouth 2 (two) times daily. 180 tablet 1  . finasteride (PROSCAR) 5 MG tablet Take 1 tablet (5 mg total) by mouth daily. 90 tablet 3  . multivitamin-lutein (OCUVITE-LUTEIN) CAPS Take 1 capsule by mouth daily.    . nitroGLYCERIN (NITROSTAT) 0.4 MG SL tablet Place 1 tablet (0.4 mg total) under the tongue every 5 (five) minutes as needed for chest pain (3 DOSES MAX). 25 tablet 2  . omeprazole (PRILOSEC) 20 MG capsule Take 20 mg by mouth daily as needed (heartburn or acid reflux).    . simvastatin (ZOCOR) 20 MG tablet Take 1 tablet (20 mg total) by mouth daily. 90 tablet 3  . terazosin (HYTRIN) 5 MG capsule Take 1 capsule (5 mg total) by mouth daily. 90 capsule 3  . warfarin (COUMADIN) 5 MG tablet Take 1 tablet daily or Take as directed by anticoagulation clinic. 90 tablet 3  . VOLTAREN 1 % GEL Apply 1 application topically 2 (two) times daily. (Patient not taking: Reported on 08/09/2019) 300 g 3   No facility-administered medications prior to visit.    ROS: Review of Systems  Constitutional: Negative for appetite change, fatigue and unexpected weight change.  HENT: Negative for congestion, nosebleeds, sneezing, sore throat and trouble swallowing.   Eyes: Negative for itching and visual disturbance.  Respiratory: Negative for cough.   Cardiovascular: Negative for chest pain, palpitations and leg swelling.  Gastrointestinal: Negative for abdominal distention, blood in stool, diarrhea and nausea.  Genitourinary: Negative for frequency and hematuria.   Musculoskeletal: Positive for gait problem. Negative for back pain, joint swelling and neck pain.  Skin: Negative for rash.  Neurological: Negative for dizziness, tremors, speech difficulty and weakness.  Psychiatric/Behavioral: Negative for agitation, dysphoric mood and sleep disturbance. The patient is not nervous/anxious.     Objective:  BP 130/82 (BP Location: Left Arm, Patient Position: Sitting, Cuff Size: Normal)   Pulse 80   Temp 97.9 F (36.6 C) (Oral)   Ht 5\' 9"  (1.753 m)   Wt 209 lb (94.8 kg)   SpO2 97%   BMI 30.86 kg/m   BP Readings from Last 3 Encounters:  08/09/19 130/82  02/08/19 130/74  12/20/18 140/90    Wt Readings from Last 3 Encounters:  08/09/19 209 lb (94.8 kg)  02/08/19 207 lb (93.9 kg)  12/20/18 208 lb (94.3 kg)    Physical Exam Constitutional:      General: He is not in acute distress.    Appearance: He is well-developed.     Comments: NAD  Eyes:     Conjunctiva/sclera: Conjunctivae normal.     Pupils: Pupils are equal, round, and reactive to light.  Neck:     Thyroid: No thyromegaly.     Vascular: No JVD.  Cardiovascular:     Rate and Rhythm: Normal rate. Rhythm irregular.     Heart sounds: Normal heart sounds. No murmur heard.  No friction rub. No gallop.   Pulmonary:     Effort: Pulmonary effort is  normal. No respiratory distress.     Breath sounds: Normal breath sounds. No wheezing or rales.  Chest:     Chest wall: No tenderness.  Abdominal:     General: Bowel sounds are normal. There is no distension.     Palpations: Abdomen is soft. There is no mass.     Tenderness: There is no abdominal tenderness. There is no guarding or rebound.  Musculoskeletal:        General: No tenderness. Normal range of motion.     Cervical back: Normal range of motion.  Lymphadenopathy:     Cervical: No cervical adenopathy.  Skin:    General: Skin is warm and dry.     Findings: No rash.  Neurological:     Mental Status: He is alert and oriented to  person, place, and time.     Cranial Nerves: No cranial nerve deficit.     Motor: No abnormal muscle tone.     Coordination: Coordination normal.     Gait: Gait normal.     Deep Tendon Reflexes: Reflexes are normal and symmetric.  Psychiatric:        Behavior: Behavior normal.        Thought Content: Thought content normal.        Judgment: Judgment normal.   A wart behind L ear    Procedure Note :     Procedure : Cryosurgery   Indication:  Wart(s)    Risks including unsuccessful procedure , bleeding, infection, bruising, scar, a need for a repeat  procedure and others were explained to the patient in detail as well as the benefits. Informed consent was obtained verbally.   1  lesion(s)  on L neck   was/were treated with liquid nitrogen on a Q-tip in a usual fasion . Band-Aid was applied and antibiotic ointment was given for a later use.   Tolerated well. Complications none.    Lab Results  Component Value Date   WBC 6.5 08/09/2018   HGB 14.3 08/09/2018   HCT 43.4 08/09/2018   PLT 126.0 (L) 08/09/2018   GLUCOSE 103 (H) 02/08/2019   CHOL 120 02/08/2019   TRIG 84.0 02/08/2019   HDL 37.60 (L) 02/08/2019   LDLCALC 66 02/08/2019   ALT 16 02/08/2019   AST 19 02/08/2019   NA 140 02/08/2019   K 4.0 02/08/2019   CL 106 02/08/2019   CREATININE 1.04 02/08/2019   BUN 19 02/08/2019   CO2 29 02/08/2019   TSH 3.58 02/03/2018   PSA 0.68 08/09/2018   INR 2.8 07/25/2019   HGBA1C (H) 10/23/2009    6.0 (NOTE)                                                                       According to the ADA Clinical Practice Recommendations for 2011, when HbA1c is used as a screening test:   >=6.5%   Diagnostic of Diabetes Mellitus           (if abnormal result  is confirmed)  5.7-6.4%   Increased risk of developing Diabetes Mellitus  References:Diagnosis and Classification of Diabetes Mellitus,Diabetes YOVZ,8588,50(YDXAJ 1):S62-S69 and Standards of Medical Care in         Diabetes -  2011,Diabetes OINO,6767,20  (  Suppl 1):S11-S61.    DG Chest 2 View  Result Date: 08/09/2018 CLINICAL DATA:  No chest complaints. EXAM: CHEST - 2 VIEW COMPARISON:  Chest radiograph 09/13/2017 FINDINGS: Stable enlarged cardiac and mediastinal contours. Similar-appearing bibasilar linear opacities, likely representing scarring. Elevation right hemidiaphragm. Thoracic spine degenerative changes. No pleural effusion or pneumothorax. IMPRESSION: No acute cardiopulmonary process. Electronically Signed   By: Lovey Newcomer M.D.   On: 08/09/2018 17:23    Assessment & Plan:    [name], MD

## 2019-08-09 NOTE — Addendum Note (Signed)
Addended by: Trenda Moots on: 08/10/4857 11:14 AM   Modules accepted: Orders

## 2019-09-01 NOTE — Progress Notes (Addendum)
Pls cosign for coumadin check.Marland KitchenJohny Chess  Medical screening examination/treatment/procedure(s) were performed by non-physician practitioner and as supervising physician I was immediately available for consultation/collaboration.  I agree with above. Lew Dawes, MD

## 2019-09-05 ENCOUNTER — Ambulatory Visit (INDEPENDENT_AMBULATORY_CARE_PROVIDER_SITE_OTHER): Payer: MEDICARE | Admitting: General Practice

## 2019-09-05 ENCOUNTER — Other Ambulatory Visit: Payer: Self-pay

## 2019-09-05 DIAGNOSIS — Z7901 Long term (current) use of anticoagulants: Secondary | ICD-10-CM

## 2019-09-05 LAB — POCT INR: INR: 2.4 (ref 2.0–3.0)

## 2019-09-05 NOTE — Patient Instructions (Signed)
Pre visit review using our clinic review tool, if applicable. No additional management support is needed unless otherwise documented below in the visit note.  Continue to take 1/2 tablet daily except for 1 tablet on Monday and Fridays.  Re-check in 6 weeks.

## 2019-09-05 NOTE — Progress Notes (Signed)
Pls cosign for coumadin check.Marland KitchenJohny [name]

## 2019-10-17 ENCOUNTER — Ambulatory Visit (INDEPENDENT_AMBULATORY_CARE_PROVIDER_SITE_OTHER): Payer: MEDICARE | Admitting: General Practice

## 2019-10-17 ENCOUNTER — Other Ambulatory Visit: Payer: Self-pay

## 2019-10-17 DIAGNOSIS — Z7901 Long term (current) use of anticoagulants: Secondary | ICD-10-CM | POA: Diagnosis not present

## 2019-10-17 LAB — POCT INR: INR: 2.7 (ref 2.0–3.0)

## 2019-10-17 NOTE — Patient Instructions (Signed)
Pre visit review using our clinic review tool, if applicable. No additional management support is needed unless otherwise documented below in the visit note.  Continue to take 1/2 tablet daily except for 1 tablet on Monday and Fridays.  Re-check in 6 weeks.

## 2019-11-28 ENCOUNTER — Ambulatory Visit (INDEPENDENT_AMBULATORY_CARE_PROVIDER_SITE_OTHER): Payer: MEDICARE | Admitting: General Practice

## 2019-11-28 ENCOUNTER — Other Ambulatory Visit: Payer: Self-pay

## 2019-11-28 DIAGNOSIS — Z7901 Long term (current) use of anticoagulants: Secondary | ICD-10-CM | POA: Diagnosis not present

## 2019-11-28 DIAGNOSIS — I4891 Unspecified atrial fibrillation: Secondary | ICD-10-CM

## 2019-11-28 DIAGNOSIS — Z23 Encounter for immunization: Secondary | ICD-10-CM

## 2019-11-28 LAB — POCT INR: INR: 2.5 (ref 2.0–3.0)

## 2019-11-28 NOTE — Patient Instructions (Addendum)
Pre visit review using our clinic review tool, if applicable. No additional management support is needed unless otherwise documented below in the visit note.  Continue to take 1/2 tablet daily except for 1 tablet on Monday and Fridays.  Re-check in 6 weeks.

## 2019-12-26 NOTE — Progress Notes (Signed)
Cardiology Office Note Date:  12/26/2019  Patient ID:  Bernard [name] 03/10/1932, MRN [id number] PCP:  Cassandria Anger, MD  Electrophysiologist:  Dr. Rayann Heman     Chief Complaint: annual EP visit  History of Present Illness: Bernard [name] is a 86 y.o. male with history of CAD (Unknown what his anatomy is, reports of an MI 60), lung cancer (s/pR resection 2010), prior stroke, longstanding persistent Afib, HTN.  He comes today to be seen for Dr. Rayann Heman.  Last seen by him Oct 2019, at that visit discussed chronic DOE (worse with inclines) and occasional fatigue.  No changes were made to his regime.  On coumadin for a/c, pt did not want an aggressive approach for his AF.  I saw him Oct 2020 He is doing well.  He has bad knees and no longer able to walk the way he used to, on the streets/paths, but does walk behind his lawn mower (he points out is self propelled), will ride his stationary bike without SOB/DOE, no CP, palpitations or cardiac awareness.  Last week (Friday) he mowed his grass as usual, was walking back towards the house and felt his legs get weak suddenly and had to sit down, thinking they were going to give out on him.  They were equally weak, not one or the other, no back pain, no CP, he denies any palpitations with this, he was not lightheaded, did not feel like he was going to faint, no other symptoms of any kind.  His legs just felt suddenly weak, no pain.  He sat for a "little while" got up and they felt well, normal again. He reports compliance with his warfarin, no bleeding/signs of bleeding.  Has been stable with therapeutic INRs and not need for adjustments.  No changes were made, asked to monitor for any recurrent weak spells and f/u with his PMD.  BP unusually high for him, no changes made.  Planned for annual visits.   TODAY He continues to do quite well. Remains active, cares for his years and home.  He rides a mower on flat areas, but uses a push mower  in the inclines. No CP, palpitations or cardiac awareness, reports good exertional capacity No SOB or DOE No dizzy spells, near syncope or syncope, no weak spells.  He reports (and is noted) stable INRs and warfarin dosing   Past Medical History:  Diagnosis Date  . CAD (coronary artery disease)    s/p MI 1976, normal myoview 7/11  . Depressive disorder, not elsewhere classified   . Esophageal reflux   . Headache    migraines occ., none in months.  . History of BPH    benign prostatic hypertrophy  . Labyrinthitis, unspecified   . Malignant neoplasm middle lobe, bronchus or lung    right lung. Dx 07/27/08, sp/p resection 7/10.   . Osteoarthrosis, unspecified whether generalized or localized, lower leg    knee  . Other and unspecified hyperlipidemia   . Paroxysmal atrial fibrillation (HCC)    postoperative after lung surgery  . Renal cyst, right    being followed  . Stroke [facility])    TIA March 2000     no side effects now  . Unspecified essential hypertension     Past Surgical History:  Procedure Laterality Date  . anthroscopic surgery  '10   right knee \  . BUNIONECTOMY  '89   left foot  . CATARACT EXTRACTION, BILATERAL    . ESOPHAGOGASTRODUODENOSCOPY  05/14/04  .  EYE SURGERY     both eyes - 2011 [facility])  . INGUINAL HERNIA REPAIR  '71   left  . right TKR  8/11  . TOTAL KNEE ARTHROPLASTY Left 09/24/2014   Procedure: LEFT TOTAL KNEE ARTHROPLASTY;  Surgeon: Gaynelle Arabian, MD;  Location: [facility] OR[facility];  Service: Orthopedics;  Laterality: Left;  [name] VAT-RML lobectomy      Current Outpatient Medications  Medication Sig Dispense Refill  . cholecalciferol (VITAMIN D) 1000 UNITS tablet Take 1,000 Units by mouth daily.    . diazepam (VALIUM) 5 MG tablet Take 1 tablet (5 mg total) by mouth 2 (two) times daily. 180 tablet 1  . finasteride (PROSCAR) 5 MG tablet Take 1 tablet (5 mg total) by mouth daily. 90 tablet 3  . multivitamin-lutein (OCUVITE-LUTEIN) CAPS Take 1 capsule by mouth daily.     . nitroGLYCERIN (NITROSTAT) 0.4 MG SL tablet Place 1 tablet (0.4 mg total) under the tongue every 5 (five) minutes as needed for chest pain (3 DOSES MAX). 25 tablet 2  . omeprazole (PRILOSEC) 20 MG capsule Take 20 mg by mouth daily as needed (heartburn or acid reflux).    . simvastatin (ZOCOR) 20 MG tablet Take 1 tablet (20 mg total) by mouth daily. 90 tablet 3  . terazosin (HYTRIN) 5 MG capsule Take 1 capsule (5 mg total) by mouth daily. 90 capsule 3  . warfarin (COUMADIN) 5 MG tablet Take 1 tablet daily or Take as directed by anticoagulation clinic. 90 tablet 3   No current facility-administered medications for this visit.    Allergies:   Patient has no known allergies.   Social History:  The patient  reports that he has never smoked. He has never used smokeless tobacco. He reports that he does not drink alcohol and does not use drugs.   Family History:  The patient's family history includes Diabetes insipidus in his unknown relative.  ROS:  Please see the history of present illness. All other systems are reviewed and otherwise negative.   PHYSICAL EXAM:  VS:  There were no vitals taken for this visit. BMI: There is no height or weight on file to calculate BMI. Well nourished, well developed, in no acute distress  HEENT: normocephalic, atraumatic  Neck: no JVD, carotid bruits or masses Cardiac:  irreg-irreg,  no significant murmurs, no rubs, or gallops Lungs: CTA b/l, no wheezing, rhonchi or rales  Abd: soft, nontender MS: no deformity or atrophy Ext: no edema Skin: warm and dry, no rash Neuro:  No gross deficits appreciated Psych: euthymic mood, full affect     EKG:  Done today and reviewed by myself shows  Coarse AFib, 80bpm, unchanged  10/25/14: TTE Study Conclusions - Left ventricle: The cavity size was normal. Systolic function was   normal. The estimated ejection fraction was in the range of 55%   to 60%. Wall motion was normal; there were no regional wall   motion  abnormalities. There was an increased relative   contribution of atrial contraction to ventricular filling.   Doppler parameters are consistent with abnormal left ventricular   relaxation (grade 1 diastolic dysfunction). - Aortic valve: Trileaflet; normal thickness, mildly calcified   leaflets. - Mitral valve: There was mild regurgitation. - Right ventricle: The cavity size was mildly dilated. Wall   thickness was normal. - Atrial septum: There was increased thickness of the septum,   consistent with lipomatous hypertrophy.   Recent Labs: 08/09/2019: ALT 15; BUN 21; Creatinine, Ser 1.03; Hemoglobin 14.1; Platelets 114.0; Potassium  4.3; Sodium 141; TSH 3.04  08/09/2019: Cholesterol 110; HDL 39.60; LDL Cholesterol 59; Total CHOL/HDL Ratio 3; Triglycerides 59.0; VLDL 11.8   CrCl cannot be calculated (Patient's most recent lab result is older than the maximum 21 days allowed.).   Wt Readings from Last 3 Encounters:  08/09/19 209 lb (94.8 kg)  02/08/19 207 lb (93.9 kg)  12/20/18 208 lb (94.3 kg)     Other studies reviewed: Additional studies/records reviewed today include: summarized above  ASSESSMENT AND PLAN:  1. Longstanding persistent AFib >> permanent     Patient does not want any aggressive management of his AF     CHA2DS2Vasc is 4, on warfarin, monitored and managed with his PMD     Rate controlled, asymptomatic  2. HTN     Looks good     No changes  3. HLD     Monitored and managed by his PMD       4. CAD      No anginal sounding complaints      On statin, no ASA with warfarin    Disposition: he sees his PMD Q 30mo, we will continue to see him annually  Current medicines are reviewed at length with the patient today.  The patient did not have any concerns regarding medicines.  Venetia Night, PA-C 12/26/2019 8:12 PM     [address] [address] [phone] (office)  [phone] (fax)

## 2019-12-27 ENCOUNTER — Ambulatory Visit (INDEPENDENT_AMBULATORY_CARE_PROVIDER_SITE_OTHER): Payer: MEDICARE | Admitting: Physician Assistant

## 2019-12-27 ENCOUNTER — Encounter: Payer: Self-pay | Admitting: Physician Assistant

## 2019-12-27 ENCOUNTER — Other Ambulatory Visit: Payer: Self-pay

## 2019-12-27 VITALS — BP 110/80 | HR 80 | Ht 69.0 in | Wt 208.8 lb

## 2019-12-27 DIAGNOSIS — I4811 Longstanding persistent atrial fibrillation: Secondary | ICD-10-CM

## 2019-12-27 DIAGNOSIS — I2510 Atherosclerotic heart disease of native coronary artery without angina pectoris: Secondary | ICD-10-CM | POA: Diagnosis not present

## 2019-12-27 DIAGNOSIS — I10 Essential (primary) hypertension: Secondary | ICD-10-CM | POA: Diagnosis not present

## 2019-12-27 NOTE — Patient Instructions (Signed)
Medication Instructions:   Your physician recommends that you continue on your current medications as directed. Please refer to the Current Medication list given to you today.  *If you need a refill on your cardiac medications before your next appointment, please call your pharmacy*   Lab Work: [place]   If you have labs (blood work) drawn today and your tests are completely normal, you will receive your results only by: [name] MyChart Message (if you have MyChart) OR . A paper copy in the mail If you have any lab test that is abnormal or we need to change your treatment, we will call you to review the results.   Testing/Procedures: NONE ORDERED  TODAY    Follow-Up: At [facility], you and your health needs are our priority.  As part of our continuing mission to provide you with exceptional heart care, we have created designated Provider Care Teams.  These Care Teams include your primary Cardiologist (physician) and Advanced Practice Providers (APPs -  Physician Assistants and Nurse Practitioners) who all work together to provide you with the care you need, when you need it.  We recommend signing up for the patient portal called "MyChart".  Sign up information is provided on this After Visit Summary.  MyChart is used to connect with patients for Virtual Visits (Telemedicine).  Patients are able to view lab/test results, encounter notes, upcoming appointments, etc.  Non-urgent messages can be sent to your provider as well.   To learn more about what you can do with MyChart, go to [email or web address].    Your next appointment:   1 year(s)  The format for your next appointment:   In Person  Provider:   You may see Dr. Rayann Heman or one of the following Advanced Practice Providers on your designated Care Team:    Tommye Standard, [place]     Other Instructions

## 2020-01-09 ENCOUNTER — Ambulatory Visit (INDEPENDENT_AMBULATORY_CARE_PROVIDER_SITE_OTHER): Payer: MEDICARE | Admitting: General Practice

## 2020-01-09 ENCOUNTER — Other Ambulatory Visit: Payer: Self-pay

## 2020-01-09 DIAGNOSIS — I4891 Unspecified atrial fibrillation: Secondary | ICD-10-CM | POA: Diagnosis not present

## 2020-01-09 DIAGNOSIS — Z7901 Long term (current) use of anticoagulants: Secondary | ICD-10-CM

## 2020-01-09 LAB — POCT INR: INR: 2.8 (ref 2.0–3.0)

## 2020-01-09 NOTE — Patient Instructions (Signed)
Pre visit review using our clinic review tool, if applicable. No additional management support is needed unless otherwise documented below in the visit note.  Continue to take 1/2 tablet daily except for 1 tablet on Monday and Fridays.  Re-check in 6 weeks.

## 2020-02-07 ENCOUNTER — Other Ambulatory Visit: Payer: Self-pay

## 2020-02-08 ENCOUNTER — Encounter: Payer: Self-pay | Admitting: Internal Medicine

## 2020-02-08 ENCOUNTER — Ambulatory Visit (INDEPENDENT_AMBULATORY_CARE_PROVIDER_SITE_OTHER): Payer: MEDICARE | Admitting: Internal Medicine

## 2020-02-08 DIAGNOSIS — E785 Hyperlipidemia, unspecified: Secondary | ICD-10-CM

## 2020-02-08 DIAGNOSIS — N401 Enlarged prostate with lower urinary tract symptoms: Secondary | ICD-10-CM

## 2020-02-08 DIAGNOSIS — Z7901 Long term (current) use of anticoagulants: Secondary | ICD-10-CM

## 2020-02-08 DIAGNOSIS — I4891 Unspecified atrial fibrillation: Secondary | ICD-10-CM

## 2020-02-08 DIAGNOSIS — F4321 Adjustment disorder with depressed mood: Secondary | ICD-10-CM

## 2020-02-08 DIAGNOSIS — K2100 Gastro-esophageal reflux disease with esophagitis, without bleeding: Secondary | ICD-10-CM

## 2020-02-08 DIAGNOSIS — I10 Essential (primary) hypertension: Secondary | ICD-10-CM | POA: Diagnosis not present

## 2020-02-08 DIAGNOSIS — N138 Other obstructive and reflux uropathy: Secondary | ICD-10-CM

## 2020-02-08 DIAGNOSIS — I2510 Atherosclerotic heart disease of native coronary artery without angina pectoris: Secondary | ICD-10-CM | POA: Diagnosis not present

## 2020-02-08 LAB — TSH: TSH: 3.73 u[IU]/mL (ref 0.35–4.50)

## 2020-02-08 LAB — LIPID PANEL
Cholesterol: 126 mg/dL (ref 0–200)
HDL: 41.6 mg/dL (ref >39.00)
LDL Cholesterol: 68 mg/dL (ref 0–99)
NonHDL: 84.63
Total CHOL/HDL Ratio: 3
Triglycerides: 82 mg/dL (ref 0.0–149.0)
VLDL: 16.4 mg/dL (ref 0.0–40.0)

## 2020-02-08 LAB — COMPREHENSIVE METABOLIC PANEL
ALT: 18 U/L (ref 0–53)
AST: 22 U/L (ref 0–37)
Albumin: 4.1 g/dL (ref 3.5–5.2)
Alkaline Phosphatase: 34 U/L — ABNORMAL LOW (ref 39–117)
BUN: 17 mg/dL (ref 6–23)
CO2: 27 mEq/L (ref 19–32)
Calcium: 9.3 mg/dL (ref 8.4–10.5)
Chloride: 107 mEq/L (ref 96–112)
Creatinine, Ser: 1.11 mg/dL (ref 0.40–1.50)
GFR: 60.74 mL/min (ref >60.00)
Glucose, Bld: 98 mg/dL (ref 70–99)
Potassium: 4.2 mEq/L (ref 3.5–5.1)
Sodium: 141 mEq/L (ref 135–145)
Total Bilirubin: 0.9 mg/dL (ref 0.2–1.2)
Total Protein: 7 g/dL (ref 6.0–8.3)

## 2020-02-08 MED ORDER — FINASTERIDE 5 MG PO TABS: 5.0000 mg | ORAL_TABLET | Freq: Every day | ORAL | 3 refills | Status: DC

## 2020-02-08 MED ORDER — DIAZEPAM 5 MG PO TABS: 5.0000 mg | ORAL_TABLET | Freq: Two times a day (BID) | ORAL | 1 refills | Status: DC | PRN

## 2020-02-08 MED ORDER — WARFARIN SODIUM 5 MG PO TABS
ORAL_TABLET | ORAL | 3 refills | Status: DC
Start: 2020-02-08 — End: 2021-02-05

## 2020-02-08 MED ORDER — SIMVASTATIN 20 MG PO TABS: 20.0000 mg | ORAL_TABLET | Freq: Every day | ORAL | 3 refills | Status: DC

## 2020-02-08 MED ORDER — TERAZOSIN HCL 5 MG PO CAPS: 5.0000 mg | ORAL_CAPSULE | Freq: Every day | ORAL | 3 refills | Status: DC

## 2020-02-08 MED ORDER — NITROGLYCERIN 0.4 MG SL SUBL: 0.4000 mg | SUBLINGUAL_TABLET | SUBLINGUAL | 2 refills | Status: DC | PRN

## 2020-02-08 NOTE — Assessment & Plan Note (Addendum)
On Coumadin.  Continue with Coumadin.  Coumadin clinic at least monthly visits.

## 2020-02-08 NOTE — Assessment & Plan Note (Addendum)
We will continue with simvastatin.  Blood work-lipids/liver function tests periodically

## 2020-02-08 NOTE — Progress Notes (Signed)
Subjective:  Patient ID: Bernard [name], male    DOB: 1932/08/19  Age: 86 y.o. MRN: [id number]  CC: Follow-up (6 month f/u- Requesting written rx on refills)   HPI Bernard [name] presents for GERD, dyslipidemia, anticoagulation f/u  Outpatient Medications Prior to Visit  Medication Sig Dispense Refill  . cholecalciferol (VITAMIN D) 1000 UNITS tablet Take 1,000 Units by mouth daily.    . multivitamin-lutein (OCUVITE-LUTEIN) CAPS Take 1 capsule by mouth daily.    . nitroGLYCERIN (NITROSTAT) 0.4 MG SL tablet Place 1 tablet (0.4 mg total) under the tongue every 5 (five) minutes as needed for chest pain (3 DOSES MAX). 25 tablet 2  . omeprazole (PRILOSEC) 20 MG capsule Take 20 mg by mouth daily as needed (heartburn or acid reflux).    . diazepam (VALIUM) 5 MG tablet Take 1 tablet (5 mg total) by mouth 2 (two) times daily. (Patient taking differently: Take 5 mg by mouth every 6 (six) hours as needed for anxiety.) 180 tablet 1  . finasteride (PROSCAR) 5 MG tablet Take 1 tablet (5 mg total) by mouth daily. 90 tablet 3  . simvastatin (ZOCOR) 20 MG tablet Take 1 tablet (20 mg total) by mouth daily. 90 tablet 3  . terazosin (HYTRIN) 5 MG capsule Take 1 capsule (5 mg total) by mouth daily. 90 capsule 3  . warfarin (COUMADIN) 5 MG tablet Take 1 tablet daily or Take as directed by anticoagulation clinic. 90 tablet 3   No facility-administered medications prior to visit.    ROS: Review of Systems  Constitutional: Negative for appetite change, fatigue and unexpected weight change.  HENT: Negative for congestion, nosebleeds, sneezing, sore throat and trouble swallowing.   Eyes: Negative for itching and visual disturbance.  Respiratory: Negative for cough.   Cardiovascular: Positive for leg swelling. Negative for chest pain and palpitations.  Gastrointestinal: Negative for abdominal distention, blood in stool, diarrhea and nausea.  Genitourinary: Negative for frequency and hematuria.   Musculoskeletal: Negative for back pain, gait problem, joint swelling and neck pain.  Skin: Negative for rash.  Neurological: Negative for dizziness, tremors, speech difficulty and weakness.  Psychiatric/Behavioral: Negative for agitation, dysphoric mood and sleep disturbance. The patient is not nervous/anxious.    Mild swelling  Objective:  BP 130/82 (BP Location: Left Arm)   Pulse 80   Temp 98.1 F (36.7 C) (Oral)   Wt 210 lb 6.4 oz (95.4 kg)   SpO2 95%   BMI 31.07 kg/m   BP Readings from Last 3 Encounters:  02/08/20 130/82  12/27/19 110/80  08/09/19 130/82    Wt Readings from Last 3 Encounters:  02/08/20 210 lb 6.4 oz (95.4 kg)  12/27/19 208 lb 12.8 oz (94.7 kg)  08/09/19 209 lb (94.8 kg)    Physical Exam Constitutional:      General: He is not in acute distress.    Appearance: He is well-developed. He is obese.     Comments: NAD  HENT:     Mouth/Throat:     Mouth: Oropharynx is clear and moist.  Eyes:     Conjunctiva/sclera: Conjunctivae normal.     Pupils: Pupils are equal, round, and reactive to light.  Neck:     Thyroid: No thyromegaly.     Vascular: No JVD.  Cardiovascular:     Rate and Rhythm: Normal rate and regular rhythm.     Pulses: Intact distal pulses.     Heart sounds: Normal heart sounds. No murmur heard. No friction rub. No gallop.  Pulmonary:     Effort: Pulmonary effort is normal. No respiratory distress.     Breath sounds: Normal breath sounds. No wheezing or rales.  Chest:     Chest wall: No tenderness.  Abdominal:     General: Bowel sounds are normal. There is no distension.     Palpations: Abdomen is soft. There is no mass.     Tenderness: There is no abdominal tenderness. There is no guarding or rebound.  Musculoskeletal:        General: No tenderness or edema. Normal range of motion.     Cervical back: Normal range of motion.  Lymphadenopathy:     Cervical: No cervical adenopathy.  Skin:    General: Skin is warm and dry.      Findings: No rash.  Neurological:     Mental Status: He is alert and oriented to person, place, and time.     Cranial Nerves: No cranial nerve deficit.     Motor: No abnormal muscle tone.     Coordination: He displays a negative Romberg sign. Coordination normal.     Gait: Gait normal.     Deep Tendon Reflexes: Reflexes are normal and symmetric.  Psychiatric:        Mood and Affect: Mood and affect normal.        Behavior: Behavior normal.        Thought Content: Thought content normal.        Judgment: Judgment normal.   in NSR today  Lab Results  Component Value Date   WBC 6.4 08/09/2019   HGB 14.1 08/09/2019   HCT 41.8 08/09/2019   PLT 114.0 (L) 08/09/2019   GLUCOSE 96 08/09/2019   CHOL 110 08/09/2019   TRIG 59.0 08/09/2019   HDL 39.60 08/09/2019   LDLCALC 59 08/09/2019   ALT 15 08/09/2019   AST 20 08/09/2019   NA 141 08/09/2019   K 4.3 08/09/2019   CL 107 08/09/2019   CREATININE 1.03 08/09/2019   BUN 21 08/09/2019   CO2 30 08/09/2019   TSH 3.04 08/09/2019   PSA 0.44 08/09/2019   INR 2.8 01/09/2020   HGBA1C (H) 10/23/2009    6.0 (NOTE)                                                                       According to the ADA Clinical Practice Recommendations for 2011, when HbA1c is used as a screening test:   >=6.5%   Diagnostic of Diabetes Mellitus           (if abnormal result  is confirmed)  5.7-6.4%   Increased risk of developing Diabetes Mellitus  References:Diagnosis and Classification of Diabetes Mellitus,Diabetes JKKX,3818,29(HBZJI 1):S62-S69 and Standards of Medical Care in         Diabetes - 2011,Diabetes Care,2011,34  (Suppl 1):S11-S61.    DG Chest 2 View  Result Date: 08/09/2018 CLINICAL DATA:  No chest complaints. EXAM: CHEST - 2 VIEW COMPARISON:  Chest radiograph 09/13/2017 FINDINGS: Stable enlarged cardiac and mediastinal contours. Similar-appearing bibasilar linear opacities, likely representing scarring. Elevation right hemidiaphragm. Thoracic spine  degenerative changes. No pleural effusion or pneumothorax. IMPRESSION: No acute cardiopulmonary process. Electronically Signed   By: Lovey Newcomer M.D.   On: 08/09/2018  17:23    Assessment & Plan:      Follow-up: No follow-ups on file.  [name], MD

## 2020-02-08 NOTE — Patient Instructions (Signed)
You can try Lion's Mane Mushroom capsules for memory, focus, neuropathy ([employer] or [email or web address])

## 2020-02-08 NOTE — Assessment & Plan Note (Addendum)
No CP Continue with simvastatin simvastatin

## 2020-02-08 NOTE — Assessment & Plan Note (Signed)
Lions mane

## 2020-02-08 NOTE — Assessment & Plan Note (Signed)
  On Terazosin

## 2020-02-11 NOTE — Assessment & Plan Note (Signed)
Ongoing symptoms.  Continue with Terazosin and finasteride

## 2020-02-11 NOTE — Assessment & Plan Note (Signed)
Continue with omeprazole  Potential benefits of a long term PPI use as well as potential risks  and complications were explained to the patient and were aknowledged.

## 2020-02-20 ENCOUNTER — Other Ambulatory Visit: Payer: Self-pay

## 2020-02-20 ENCOUNTER — Ambulatory Visit (INDEPENDENT_AMBULATORY_CARE_PROVIDER_SITE_OTHER): Payer: MEDICARE | Admitting: General Practice

## 2020-02-20 DIAGNOSIS — I4891 Unspecified atrial fibrillation: Secondary | ICD-10-CM

## 2020-02-20 DIAGNOSIS — Z7901 Long term (current) use of anticoagulants: Secondary | ICD-10-CM | POA: Diagnosis not present

## 2020-02-20 LAB — POCT INR: INR: 2.2 (ref 2.0–3.0)

## 2020-02-20 NOTE — Patient Instructions (Incomplete)
Pre visit review using our clinic review tool, if applicable. No additional management support is needed unless otherwise documented below in the visit note. 

## 2020-04-02 ENCOUNTER — Ambulatory Visit (INDEPENDENT_AMBULATORY_CARE_PROVIDER_SITE_OTHER): Payer: MEDICARE | Admitting: General Practice

## 2020-04-02 ENCOUNTER — Other Ambulatory Visit: Payer: Self-pay

## 2020-04-02 DIAGNOSIS — Z7901 Long term (current) use of anticoagulants: Secondary | ICD-10-CM | POA: Diagnosis not present

## 2020-04-02 DIAGNOSIS — I4891 Unspecified atrial fibrillation: Secondary | ICD-10-CM

## 2020-04-02 LAB — POCT INR: INR: 2.5 (ref 2.0–3.0)

## 2020-04-02 NOTE — Patient Instructions (Addendum)
Pre visit review using our clinic review tool, if applicable. No additional management support is needed unless otherwise documented below in the visit note.  Continue to take 1/2 tablet daily except for 1 tablet on Monday and Fridays.  Re-check in 6 weeks.

## 2020-05-14 ENCOUNTER — Ambulatory Visit (INDEPENDENT_AMBULATORY_CARE_PROVIDER_SITE_OTHER): Payer: MEDICARE | Admitting: General Practice

## 2020-05-14 ENCOUNTER — Other Ambulatory Visit: Payer: Self-pay

## 2020-05-14 DIAGNOSIS — I4891 Unspecified atrial fibrillation: Secondary | ICD-10-CM

## 2020-05-14 DIAGNOSIS — Z7901 Long term (current) use of anticoagulants: Secondary | ICD-10-CM | POA: Diagnosis not present

## 2020-05-14 LAB — POCT INR: INR: 2.3 (ref 2.0–3.0)

## 2020-05-14 NOTE — Patient Instructions (Addendum)
Pre visit review using our clinic review tool, if applicable. No additional management support is needed unless otherwise documented below in the visit note.  Continue to take 1/2 tablet daily except for 1 tablet on Monday and Fridays.  Re-check in 6 weeks.

## 2020-05-17 ENCOUNTER — Telehealth: Payer: Self-pay | Admitting: *Deleted

## 2020-05-17 NOTE — Telephone Encounter (Signed)
Rec'd PA fr Diazepam. Completed vis cover-my-meds w/ Key: [id number]). Rec'd msg stating " Your information has been submitted to [place] [employer]. [employer] D will review the request and will issue a decision, typically within 1-3 days from your submission"...Bernard [name]

## 2020-05-17 NOTE — Telephone Encounter (Signed)
Rec'd message " your request has been approved". Faxed approval to [email or web address]

## 2020-05-20 NOTE — Progress Notes (Signed)
Medical screening examination/treatment/procedure(s) were performed by non-physician practitioner and as supervising physician I was immediately available for consultation/collaboration. I agree with above. Aleksei Plotnikov, MD  

## 2020-06-25 ENCOUNTER — Ambulatory Visit (INDEPENDENT_AMBULATORY_CARE_PROVIDER_SITE_OTHER): Payer: MEDICARE | Admitting: General Practice

## 2020-06-25 ENCOUNTER — Other Ambulatory Visit: Payer: Self-pay

## 2020-06-25 DIAGNOSIS — I4891 Unspecified atrial fibrillation: Secondary | ICD-10-CM | POA: Diagnosis not present

## 2020-06-25 DIAGNOSIS — Z7901 Long term (current) use of anticoagulants: Secondary | ICD-10-CM

## 2020-06-25 LAB — POCT INR: INR: 2.9 (ref 2.0–3.0)

## 2020-06-25 NOTE — Patient Instructions (Addendum)
Pre visit review using our clinic review tool, if applicable. No additional management support is needed unless otherwise documented below in the visit note.  Continue to take 1/2 tablet daily except for 1 tablet on Monday and Fridays.  Re-check in 6 weeks.

## 2020-07-11 NOTE — Progress Notes (Signed)
Medical screening examination/treatment/procedure(s) were performed by non-physician practitioner and as supervising physician I was immediately available for consultation/collaboration. I agree with above. Fleeta Kunde, MD   

## 2020-08-05 ENCOUNTER — Encounter: Payer: Self-pay | Admitting: Internal Medicine

## 2020-08-05 ENCOUNTER — Ambulatory Visit (INDEPENDENT_AMBULATORY_CARE_PROVIDER_SITE_OTHER): Payer: MEDICARE | Admitting: Internal Medicine

## 2020-08-05 ENCOUNTER — Ambulatory Visit (INDEPENDENT_AMBULATORY_CARE_PROVIDER_SITE_OTHER): Payer: MEDICARE

## 2020-08-05 ENCOUNTER — Other Ambulatory Visit: Payer: Self-pay

## 2020-08-05 VITALS — BP 122/82 | HR 75 | Temp 98.1°F | Ht 69.0 in | Wt 208.2 lb

## 2020-08-05 DIAGNOSIS — Z5181 Encounter for therapeutic drug level monitoring: Secondary | ICD-10-CM

## 2020-08-05 DIAGNOSIS — I679 Cerebrovascular disease, unspecified: Secondary | ICD-10-CM

## 2020-08-05 DIAGNOSIS — C3491 Malignant neoplasm of unspecified part of right bronchus or lung: Secondary | ICD-10-CM

## 2020-08-05 DIAGNOSIS — I10 Essential (primary) hypertension: Secondary | ICD-10-CM

## 2020-08-05 DIAGNOSIS — N320 Bladder-neck obstruction: Secondary | ICD-10-CM | POA: Diagnosis not present

## 2020-08-05 LAB — COMPREHENSIVE METABOLIC PANEL
ALT: 14 U/L (ref 0–53)
AST: 19 U/L (ref 0–37)
Albumin: 4.2 g/dL (ref 3.5–5.2)
Alkaline Phosphatase: 38 U/L — ABNORMAL LOW (ref 39–117)
BUN: 17 mg/dL (ref 6–23)
CO2: 29 mEq/L (ref 19–32)
Calcium: 9.1 mg/dL (ref 8.4–10.5)
Chloride: 106 mEq/L (ref 96–112)
Creatinine, Ser: 1.13 mg/dL (ref 0.40–1.50)
GFR: 59.25 mL/min — ABNORMAL LOW (ref >60.00)
Glucose, Bld: 97 mg/dL (ref 70–99)
Potassium: 4.3 mEq/L (ref 3.5–5.1)
Sodium: 142 mEq/L (ref 135–145)
Total Bilirubin: 1.1 mg/dL (ref 0.2–1.2)
Total Protein: 6.6 g/dL (ref 6.0–8.3)

## 2020-08-05 LAB — LIPID PANEL
Cholesterol: 123 mg/dL (ref 0–200)
HDL: 38.3 mg/dL — ABNORMAL LOW (ref >39.00)
LDL Cholesterol: 67 mg/dL (ref 0–99)
NonHDL: 85.02
Total CHOL/HDL Ratio: 3
Triglycerides: 88 mg/dL (ref 0.0–149.0)
VLDL: 17.6 mg/dL (ref 0.0–40.0)

## 2020-08-05 LAB — URINALYSIS
Bilirubin Urine: NEGATIVE
Hgb urine dipstick: NEGATIVE
Ketones, ur: NEGATIVE
Leukocytes,Ua: NEGATIVE
Nitrite: NEGATIVE
Specific Gravity, Urine: 1.015 (ref 1.000–1.030)
Urine Glucose: NEGATIVE
Urobilinogen, UA: 1 (ref 0.0–1.0)
pH: 8 (ref 5.0–8.0)

## 2020-08-05 LAB — CBC WITH DIFFERENTIAL/PLATELET
Basophils Absolute: 0 10*3/uL (ref 0.0–0.1)
Basophils Relative: 0.5 % (ref 0.0–3.0)
Eosinophils Absolute: 0.1 10*3/uL (ref 0.0–0.7)
Eosinophils Relative: 0.9 % (ref 0.0–5.0)
HCT: 44.5 % (ref 39.0–52.0)
Hemoglobin: 14.6 g/dL (ref 13.0–17.0)
Lymphocytes Relative: 31.1 % (ref 12.0–46.0)
Lymphs Abs: 2.3 10*3/uL (ref 0.7–4.0)
MCHC: 32.7 g/dL (ref 30.0–36.0)
MCV: 91.9 fl (ref 78.0–100.0)
Monocytes Absolute: 0.6 10*3/uL (ref 0.1–1.0)
Monocytes Relative: 8.6 % (ref 3.0–12.0)
Neutro Abs: 4.3 10*3/uL (ref 1.4–7.7)
Neutrophils Relative %: 58.9 % (ref 43.0–77.0)
Platelets: 126 10*3/uL — ABNORMAL LOW (ref 150.0–400.0)
RBC: 4.84 Mil/uL (ref 4.22–5.81)
RDW: 14.6 % (ref 11.5–15.5)
WBC: 7.3 10*3/uL (ref 4.0–10.5)

## 2020-08-05 LAB — PSA: PSA: 0.37 ng/mL (ref 0.10–4.00)

## 2020-08-05 LAB — TSH: TSH: 2.9 u[IU]/mL (ref 0.35–4.50)

## 2020-08-05 NOTE — Progress Notes (Signed)
Subjective:  Patient ID: Bernard [name], male    DOB: [birth year]  Age: 87 y.o. MRN: [id number]  CC: Follow-up (6 MONTH F/U)   HPI JAYLEON MCFARLANE presents for HTN, GERD, dyslipidemia, h/o CVA  Outpatient Medications Prior to Visit  Medication Sig Dispense Refill   cholecalciferol (VITAMIN D) 1000 UNITS tablet Take 1,000 Units by mouth daily.     diazepam (VALIUM) 5 MG tablet Take 1 tablet (5 mg total) by mouth 2 (two) times daily as needed for anxiety. 180 tablet 1   finasteride (PROSCAR) 5 MG tablet Take 1 tablet (5 mg total) by mouth daily. 90 tablet 3   multivitamin-lutein (OCUVITE-LUTEIN) CAPS Take 1 capsule by mouth daily.     nitroGLYCERIN (NITROSTAT) 0.4 MG SL tablet Place 1 tablet (0.4 mg total) under the tongue every 5 (five) minutes as needed for chest pain (3 DOSES MAX). 25 tablet 2   omeprazole (PRILOSEC) 20 MG capsule Take 20 mg by mouth daily as needed (heartburn or acid reflux).     simvastatin (ZOCOR) 20 MG tablet Take 1 tablet (20 mg total) by mouth daily. 90 tablet 3   terazosin (HYTRIN) 5 MG capsule Take 1 capsule (5 mg total) by mouth daily. 90 capsule 3   warfarin (COUMADIN) 5 MG tablet Take 1 tablet daily or Take as directed by anticoagulation clinic. 90 tablet 3   No facility-administered medications prior to visit.    ROS: Review of Systems  Constitutional:  Negative for appetite change, fatigue and unexpected weight change.  HENT:  Negative for congestion, nosebleeds, sneezing, sore throat and trouble swallowing.   Eyes:  Negative for itching and visual disturbance.  Respiratory:  Negative for cough.   Cardiovascular:  Negative for chest pain, palpitations and leg swelling.  Gastrointestinal:  Negative for abdominal distention, blood in stool, diarrhea and nausea.  Genitourinary:  Negative for frequency and hematuria.  Musculoskeletal:  Negative for back pain, gait problem, joint swelling and neck pain.  Skin:  Negative for rash.  Neurological:   Negative for dizziness, tremors, speech difficulty and weakness.  Psychiatric/Behavioral:  Negative for agitation, dysphoric mood, sleep disturbance and suicidal ideas. The patient is not nervous/anxious.    Objective:  BP 122/82 (BP Location: Left Arm)   Pulse 75   Temp 98.1 F (36.7 C) (Oral)   Ht 5\' 9"  (1.753 m)   Wt 208 lb 3.2 oz (94.4 kg)   SpO2 96%   BMI 30.75 kg/m   BP Readings from Last 3 Encounters:  08/05/20 122/82  02/08/20 130/82  12/27/19 110/80    Wt Readings from Last 3 Encounters:  08/05/20 208 lb 3.2 oz (94.4 kg)  02/08/20 210 lb 6.4 oz (95.4 kg)  12/27/19 208 lb 12.8 oz (94.7 kg)    Physical Exam Constitutional:      General: He is not in acute distress.    Appearance: He is well-developed.     Comments: NAD  Eyes:     Conjunctiva/sclera: Conjunctivae normal.     Pupils: Pupils are equal, round, and reactive to light.  Neck:     Thyroid: No thyromegaly.     Vascular: No JVD.  Cardiovascular:     Rate and Rhythm: Normal rate and regular rhythm.     Heart sounds: Normal heart sounds. No murmur heard.   No friction rub. No gallop.  Pulmonary:     Effort: Pulmonary effort is normal. No respiratory distress.     Breath sounds: Normal breath sounds. No wheezing  or rales.  Chest:     Chest wall: No tenderness.  Abdominal:     General: Bowel sounds are normal. There is no distension.     Palpations: Abdomen is soft. There is no mass.     Tenderness: There is no abdominal tenderness. There is no guarding or rebound.  Musculoskeletal:        General: No tenderness. Normal range of motion.     Cervical back: Normal range of motion.  Lymphadenopathy:     Cervical: No cervical adenopathy.  Skin:    General: Skin is warm and dry.     Findings: No rash.  Neurological:     Mental Status: He is alert and oriented to person, place, and time.     Cranial Nerves: No cranial nerve deficit.     Motor: No abnormal muscle tone.     Coordination: Coordination  normal.     Gait: Gait normal.     Deep Tendon Reflexes: Reflexes are normal and symmetric.  Psychiatric:        Behavior: Behavior normal.        Thought Content: Thought content normal.        Judgment: Judgment normal.    Lab Results  Component Value Date   WBC 6.4 08/09/2019   HGB 14.1 08/09/2019   HCT 41.8 08/09/2019   PLT 114.0 (L) 08/09/2019   GLUCOSE 98 02/08/2020   CHOL 126 02/08/2020   TRIG 82.0 02/08/2020   HDL 41.60 02/08/2020   LDLCALC 68 02/08/2020   ALT 18 02/08/2020   AST 22 02/08/2020   NA 141 02/08/2020   K 4.2 02/08/2020   CL 107 02/08/2020   CREATININE 1.11 02/08/2020   BUN 17 02/08/2020   CO2 27 02/08/2020   TSH 3.73 02/08/2020   PSA 0.44 08/09/2019   INR 2.9 06/25/2020   HGBA1C (H) 10/23/2009    6.0 (NOTE)                                                                       According to the ADA Clinical Practice Recommendations for 2011, when HbA1c is used as a screening test:   >=6.5%   Diagnostic of Diabetes Mellitus           (if abnormal result  is confirmed)  5.7-6.4%   Increased risk of developing Diabetes Mellitus  References:Diagnosis and Classification of Diabetes Mellitus,Diabetes YJEH,6314,97(WYOVZ 1):S62-S69 and Standards of Medical Care in         Diabetes - 2011,Diabetes Care,2011,34  (Suppl 1):S11-S61.    DG Chest 2 View  Result Date: 08/09/2018 CLINICAL DATA:  No chest complaints. EXAM: CHEST - 2 VIEW COMPARISON:  Chest radiograph 09/13/2017 FINDINGS: Stable enlarged cardiac and mediastinal contours. Similar-appearing bibasilar linear opacities, likely representing scarring. Elevation right hemidiaphragm. Thoracic spine degenerative changes. No pleural effusion or pneumothorax. IMPRESSION: No acute cardiopulmonary process. Electronically Signed   By: Lovey Newcomer M.D.   On: 08/09/2018 17:23    Assessment & Plan:    Follow-up: No follow-ups on file.  [name], MD

## 2020-08-05 NOTE — Assessment & Plan Note (Signed)
Cont w/Coumadin

## 2020-08-05 NOTE — Assessment & Plan Note (Signed)
Cont w/Terazosin

## 2020-08-05 NOTE — Assessment & Plan Note (Signed)
Will get a CXR

## 2020-08-06 ENCOUNTER — Ambulatory Visit (INDEPENDENT_AMBULATORY_CARE_PROVIDER_SITE_OTHER): Payer: MEDICARE | Admitting: General Practice

## 2020-08-06 ENCOUNTER — Ambulatory Visit: Payer: Medicare Other

## 2020-08-06 DIAGNOSIS — Z7901 Long term (current) use of anticoagulants: Secondary | ICD-10-CM

## 2020-08-06 LAB — POCT INR: INR: 2.9 (ref 2.0–3.0)

## 2020-08-06 NOTE — Patient Instructions (Addendum)
Pre visit review using our clinic review tool, if applicable. No additional management support is needed unless otherwise documented below in the visit note.  Continue to take 1/2 tablet daily except for 1 tablet on Monday and Fridays.  Re-check in 6 weeks.

## 2020-08-06 NOTE — Progress Notes (Signed)
Medical screening examination/treatment/procedure(s) were performed by non-physician practitioner and as supervising physician I was immediately available for consultation/collaboration. I agree with above. Kenneth Cuaresma, MD   

## 2020-08-08 ENCOUNTER — Ambulatory Visit: Payer: Medicare Other

## 2020-08-08 ENCOUNTER — Ambulatory Visit: Payer: MEDICARE | Admitting: Internal Medicine

## 2020-09-17 ENCOUNTER — Other Ambulatory Visit: Payer: Self-pay

## 2020-09-17 ENCOUNTER — Ambulatory Visit (INDEPENDENT_AMBULATORY_CARE_PROVIDER_SITE_OTHER): Payer: MEDICARE | Admitting: General Practice

## 2020-09-17 DIAGNOSIS — I4891 Unspecified atrial fibrillation: Secondary | ICD-10-CM | POA: Diagnosis not present

## 2020-09-17 DIAGNOSIS — Z7901 Long term (current) use of anticoagulants: Secondary | ICD-10-CM

## 2020-09-17 LAB — POCT INR: INR: 2.4 (ref 2.0–3.0)

## 2020-09-17 NOTE — Progress Notes (Signed)
Medical screening examination/treatment/procedure(s) were performed by non-physician practitioner and as supervising physician I was immediately available for consultation/collaboration. I agree with above. Leiah Giannotti, MD   

## 2020-09-17 NOTE — Patient Instructions (Addendum)
Pre visit review using our clinic review tool, if applicable. No additional management support is needed unless otherwise documented below in the visit note.  Continue to take 1/2 tablet daily except for 1 tablet on Monday and Fridays.  Re-check in 6 weeks.

## 2020-10-29 ENCOUNTER — Other Ambulatory Visit: Payer: Self-pay

## 2020-10-29 ENCOUNTER — Ambulatory Visit (INDEPENDENT_AMBULATORY_CARE_PROVIDER_SITE_OTHER): Payer: MEDICARE

## 2020-10-29 DIAGNOSIS — Z7901 Long term (current) use of anticoagulants: Secondary | ICD-10-CM | POA: Diagnosis not present

## 2020-10-29 LAB — POCT INR: INR: 2.7 (ref 2.0–3.0)

## 2020-10-29 NOTE — Progress Notes (Signed)
Medical screening examination/treatment/procedure(s) were performed by non-physician practitioner and as supervising physician I was immediately available for consultation/collaboration. I agree with above. Shameer Molstad, MD   

## 2020-10-29 NOTE — Patient Instructions (Addendum)
Pre visit review using our clinic review tool, if applicable. No additional management support is needed unless otherwise documented below in the visit note.  Continue to take 1/2 tablet daily except for 1 tablet on Monday and Fridays.  Re-check in 6 weeks.

## 2020-12-10 ENCOUNTER — Other Ambulatory Visit: Payer: Self-pay

## 2020-12-10 ENCOUNTER — Ambulatory Visit (INDEPENDENT_AMBULATORY_CARE_PROVIDER_SITE_OTHER): Payer: MEDICARE

## 2020-12-10 DIAGNOSIS — Z7901 Long term (current) use of anticoagulants: Secondary | ICD-10-CM

## 2020-12-10 DIAGNOSIS — Z23 Encounter for immunization: Secondary | ICD-10-CM

## 2020-12-10 LAB — POCT INR: INR: 2.6 (ref 2.0–3.0)

## 2020-12-10 NOTE — Patient Instructions (Addendum)
Pre visit review using our clinic review tool, if applicable. No additional management support is needed unless otherwise documented below in the visit note.  Continue to take 1/2 tablet daily except for 1 tablet on Monday and Fridays.  Re-check in 6 weeks.

## 2021-01-03 ENCOUNTER — Other Ambulatory Visit: Payer: Self-pay

## 2021-01-03 ENCOUNTER — Ambulatory Visit (INDEPENDENT_AMBULATORY_CARE_PROVIDER_SITE_OTHER): Payer: MEDICARE | Admitting: Internal Medicine

## 2021-01-03 VITALS — BP 96/60 | HR 88 | Ht 70.0 in | Wt 210.0 lb

## 2021-01-03 DIAGNOSIS — I4819 Other persistent atrial fibrillation: Secondary | ICD-10-CM | POA: Diagnosis not present

## 2021-01-03 DIAGNOSIS — I10 Essential (primary) hypertension: Secondary | ICD-10-CM | POA: Diagnosis not present

## 2021-01-03 DIAGNOSIS — I2510 Atherosclerotic heart disease of native coronary artery without angina pectoris: Secondary | ICD-10-CM

## 2021-01-03 NOTE — Patient Instructions (Addendum)
Medication Instructions:  Your physician recommends that you continue on your current medications as directed. Please refer to the Current Medication list given to you today. *If you need a refill on your cardiac medications before your next appointment, please call your pharmacy*  Lab Work: None. If you have labs (blood work) drawn today and your tests are completely normal, you will receive your results only by: [place] (if you have MyChart) OR A paper copy in the mail If you have any lab test that is abnormal or we need to change your treatment, we will call you to review the results.  Testing/Procedures: None.  Follow-Up: At [facility], you and your health needs are our priority.  As part of our continuing mission to provide you with exceptional heart care, we have created designated Provider Care Teams.  These Care Teams include your primary Cardiologist (physician) and Advanced Practice Providers (APPs -  Physician Assistants and Nurse Practitioners) who all work together to provide you with the care you need, when you need it.  Your physician wants you to follow-up in: 12 months with  Thompson Grayer, MD or one of the following Advanced Practice Providers on your designated Care Team:    Tommye Standard, PA-C   You will receive a reminder letter in the mail two months in advance. If you don't receive a letter, please call our office to schedule the follow-up appointment.  We recommend signing up for the patient portal called "MyChart".  Sign up information is provided on this After Visit Summary.  MyChart is used to connect with patients for Virtual Visits (Telemedicine).  Patients are able to view lab/test results, encounter notes, upcoming appointments, etc.  Non-urgent messages can be sent to your provider as well.   To learn more about what you can do with MyChart, go to [email or web address].    Any Other Special Instructions Will Be Listed Below (If  Applicable).  Low-Sodium Eating Plan Sodium, which is an element that makes up salt, helps you maintain a healthy balance of fluids in your body. Too much sodium can increase your blood pressure and cause fluid and waste to be held in your body. Your health care provider or dietitian may recommend following this plan if you have high blood pressure (hypertension), kidney disease, liver disease, or heart failure. Eating less sodium can help lower your blood pressure, reduce swelling, and protect your heart, liver, and kidneys. What are tips for following this plan? Reading food labels The Nutrition Facts label lists the amount of sodium in one serving of the food. If you eat more than one serving, you must multiply the listed amount of sodium by the number of servings. Choose foods with less than 140 mg of sodium per serving. Avoid foods with 300 mg of sodium or more per serving. Shopping  Look for lower-sodium products, often labeled as "low-sodium" or "no salt added." Always check the sodium content, even if foods are labeled as "unsalted" or "no salt added." Buy fresh foods. Avoid canned foods and pre-made or frozen meals. Avoid canned, cured, or processed meats. Buy breads that have less than 80 mg of sodium per slice. Cooking  Eat more home-cooked food and less restaurant, buffet, and fast food. Avoid adding salt when cooking. Use salt-free seasonings or herbs instead of table salt or sea salt. Check with your health care provider or pharmacist before using salt substitutes. Cook with plant-based oils, such as canola, sunflower, or olive oil. Meal planning When eating at  a restaurant, ask that your food be prepared with less salt or no salt, if possible. Avoid dishes labeled as brined, pickled, cured, smoked, or made with soy sauce, miso, or teriyaki sauce. Avoid foods that contain MSG (monosodium glutamate). MSG is sometimes added to [place] food, bouillon, and some canned foods. Make  meals that can be grilled, baked, poached, roasted, or steamed. These are generally made with less sodium. General information Most people on this plan should limit their sodium intake to 1,500-2,000 mg (milligrams) of sodium each day. What foods should I eat? Fruits Fresh, frozen, or canned fruit. Fruit juice. Vegetables Fresh or frozen vegetables. "No salt added" canned vegetables. "No salt added" tomato sauce and paste. Low-sodium or reduced-sodium tomato and vegetable juice. Grains Low-sodium cereals, including oats, puffed wheat and rice, and shredded wheat. Low-sodium crackers. Unsalted rice. Unsalted pasta. Low-sodium bread. Whole-grain breads and whole-grain pasta. Meats and other proteins Fresh or frozen (no salt added) meat, poultry, seafood, and fish. Low-sodium canned tuna and salmon. Unsalted nuts. Dried peas, beans, and lentils without added salt. Unsalted canned beans. Eggs. Unsalted nut butters. Dairy Milk. Soy milk. Cheese that is naturally low in sodium, such as ricotta cheese, fresh mozzarella, or Swiss cheese. Low-sodium or reduced-sodium cheese. Cream cheese. Yogurt. Seasonings and condiments Fresh and dried herbs and spices. Salt-free seasonings. Low-sodium mustard and ketchup. Sodium-free salad dressing. Sodium-free light mayonnaise. Fresh or refrigerated horseradish. Lemon juice. Vinegar. Other foods Homemade, reduced-sodium, or low-sodium soups. Unsalted popcorn and pretzels. Low-salt or salt-free chips. The items listed above may not be a complete list of foods and beverages you can eat. Contact a dietitian for more information. What foods should I avoid? Vegetables Sauerkraut, pickled vegetables, and relishes. Olives. [place] fries. Onion rings. Regular canned vegetables (not low-sodium or reduced-sodium). Regular canned tomato sauce and paste (not low-sodium or reduced-sodium). Regular tomato and vegetable juice (not low-sodium or reduced-sodium). Frozen vegetables in  sauces. Grains Instant hot cereals. Bread stuffing, pancake, and biscuit mixes. Croutons. Seasoned rice or pasta mixes. Noodle soup cups. Boxed or frozen macaroni and cheese. Regular salted crackers. Self-rising flour. Meats and other proteins Meat or fish that is salted, canned, smoked, spiced, or pickled. Precooked or cured meat, such as sausages or meat loaves. Berniece Salines. Ham. Pepperoni. Hot dogs. Corned beef. Chipped beef. Salt pork. Jerky. Pickled herring. Anchovies and sardines. Regular canned tuna. Salted nuts. Dairy Processed cheese and cheese spreads. Hard cheeses. Cheese curds. Blue cheese. Feta cheese. String cheese. Regular cottage cheese. Buttermilk. Canned milk. Fats and oils Salted butter. Regular margarine. Ghee. Bacon fat. Seasonings and condiments Onion salt, garlic salt, seasoned salt, table salt, and sea salt. Canned and packaged gravies. Worcestershire sauce. Tartar sauce. Barbecue sauce. Teriyaki sauce. Soy sauce, including reduced-sodium. Steak sauce. Fish sauce. Oyster sauce. Cocktail sauce. Horseradish that you find on the shelf. Regular ketchup and mustard. Meat flavorings and tenderizers. Bouillon cubes. Hot sauce. Pre-made or packaged marinades. Pre-made or packaged taco seasonings. Relishes. Regular salad dressings. Salsa. Other foods Salted popcorn and pretzels. Corn chips and puffs. Potato and tortilla chips. Canned or dried soups. Pizza. Frozen entrees and pot pies. The items listed above may not be a complete list of foods and beverages you should avoid. Contact a dietitian for more information. Summary Eating less sodium can help lower your blood pressure, reduce swelling, and protect your heart, liver, and kidneys. Most people on this plan should limit their sodium intake to 1,500-2,000 mg (milligrams) of sodium each day. Canned, boxed, and frozen foods are  high in sodium. Restaurant foods, fast foods, and pizza are also very high in sodium. You also get sodium by  adding salt to food. Try to cook at home, eat more fresh fruits and vegetables, and eat less fast food and canned, processed, or prepared foods. This information is not intended to replace advice given to you by your health care provider. Make sure you discuss any questions you have with your health care provider. Document Revised: 03/17/2019 Document Reviewed: 01/11/2019 Elsevier Patient Education  2022 [employer].

## 2021-01-03 NOTE — Progress Notes (Signed)
PCP: Plotnikov, Evie Lacks, MD   Primary EP: Dr Rayann Heman  Bernard [name] is a 87 y.o. male who presents today for routine electrophysiology followup.  Since last being seen in our clinic, the patient reports doing reasonably well for his age.  He continues to work in his garden. He is mostly limited by DJD.  He has recently noticed mild edema.  This corresponds to eating more country ham recently.  Today, he denies symptoms of palpitations, chest pain,  SOB, dizziness, presyncope, or syncope.  The patient is otherwise without complaint today.   Past Medical History:  Diagnosis Date   CAD (coronary artery disease)    s/p MI 1976, normal myoview 7/11   Depressive disorder, not elsewhere classified    Esophageal reflux    Headache    migraines occ., none in months.   History of BPH    benign prostatic hypertrophy   Labyrinthitis, unspecified    Malignant neoplasm middle lobe, bronchus or lung    right lung. Dx 07/27/08, sp/p resection 7/10.    Osteoarthrosis, unspecified whether generalized or localized, lower leg    knee   Other and unspecified hyperlipidemia    Paroxysmal atrial fibrillation (HCC)    postoperative after lung surgery   Renal cyst, right    being followed   Stroke [facility])    TIA March 2000     no side effects now   Unspecified essential hypertension    Past Surgical History:  Procedure Laterality Date   anthroscopic surgery  '10   right knee \   BUNIONECTOMY  '89   left foot   CATARACT EXTRACTION, BILATERAL     ESOPHAGOGASTRODUODENOSCOPY  05/14/04   EYE SURGERY     both eyes - 2011 ([place])   INGUINAL HERNIA REPAIR  '71   left   right TKR  8/11   TOTAL KNEE ARTHROPLASTY Left 09/24/2014   Procedure: LEFT TOTAL KNEE ARTHROPLASTY;  Surgeon: Gaynelle Arabian, MD;  Location: [facility];  Service: Orthopedics;  Laterality: Left;   VAT-RML lobectomy      ROS- all systems are reviewed and negatives except as per HPI above  Current Outpatient Medications  Medication Sig  Dispense Refill   cholecalciferol (VITAMIN D) 1000 UNITS tablet Take 1,000 Units by mouth daily.     diazepam (VALIUM) 5 MG tablet Take 1 tablet (5 mg total) by mouth 2 (two) times daily as needed for anxiety. 180 tablet 1   finasteride (PROSCAR) 5 MG tablet Take 1 tablet (5 mg total) by mouth daily. 90 tablet 3   multivitamin-lutein (OCUVITE-LUTEIN) CAPS Take 1 capsule by mouth daily.     nitroGLYCERIN (NITROSTAT) 0.4 MG SL tablet Place 1 tablet (0.4 mg total) under the tongue every 5 (five) minutes as needed for chest pain (3 DOSES MAX). 25 tablet 2   omeprazole (PRILOSEC) 20 MG capsule Take 20 mg by mouth daily as needed (heartburn or acid reflux).     simvastatin (ZOCOR) 20 MG tablet Take 1 tablet (20 mg total) by mouth daily. 90 tablet 3   terazosin (HYTRIN) 5 MG capsule Take 1 capsule (5 mg total) by mouth daily. 90 capsule 3   warfarin (COUMADIN) 5 MG tablet Take 1 tablet daily or Take as directed by anticoagulation clinic. 90 tablet 3   No current facility-administered medications for this visit.    Physical Exam: Vitals:   01/03/21 1058  BP: 96/60  Pulse: 88  SpO2: 96%  Weight: 210 lb (95.3 kg)  Height: 5\' 10"  (1.778 m)    GEN- The patient is elderly appearing, alert and oriented x 3 today.   Head- normocephalic, atraumatic Eyes-  Sclera clear, conjunctiva pink Ears- hearing intact Oropharynx- clear Lungs- Clear to ausculation bilaterally, normal work of breathing Heart- iRRR GI- soft, NT, ND, + BS Extremities- no clubbing, cyanosis, or edema  Wt Readings from Last 3 Encounters:  01/03/21 210 lb (95.3 kg)  08/05/20 208 lb 3.2 oz (94.4 kg)  02/08/20 210 lb 6.4 oz (95.4 kg)    EKG tracing ordered today is personally reviewed and shows afib  Assessment and Plan:  Permanent afib Rate controlled Asymptomatic Chads2vasc score is 6.  Continue coumadin  2. HTN Stable No change required today 2 gram sodium restriction is advised.  3. CAD No ischemic symptoms No  changes  Return to see EP APP annually  Thompson Grayer MD, [facility] 01/03/2021 11:21 AM

## 2021-01-21 ENCOUNTER — Other Ambulatory Visit: Payer: Self-pay

## 2021-01-21 ENCOUNTER — Ambulatory Visit (INDEPENDENT_AMBULATORY_CARE_PROVIDER_SITE_OTHER): Payer: MEDICARE

## 2021-01-21 DIAGNOSIS — Z7901 Long term (current) use of anticoagulants: Secondary | ICD-10-CM

## 2021-01-21 LAB — POCT INR: INR: 2.9 (ref 2.0–3.0)

## 2021-01-21 NOTE — Patient Instructions (Addendum)
Pre visit review using our clinic review tool, if applicable. No additional management support is needed unless otherwise documented below in the visit note.  Continue to take 1/2 tablet daily except for 1 tablet on Monday and Fridays.  Re-check in 6 weeks.

## 2021-01-21 NOTE — Progress Notes (Signed)
Patient ID: Bernard [name], male   DOB: 02-06-1933, 87 y.o.   MRN: [id number]  Medical screening examination/treatment/procedure(s) were performed by non-physician practitioner and as supervising physician I was immediately available for consultation/collaboration.  I agree with above. Cathlean Cower, MD

## 2021-01-21 NOTE — Progress Notes (Signed)
Continue to take 1/2 tablet daily except for 1 tablet on Monday and Fridays.  Re-check in 6 weeks.

## 2021-02-05 ENCOUNTER — Encounter: Payer: Self-pay | Admitting: Internal Medicine

## 2021-02-05 ENCOUNTER — Other Ambulatory Visit: Payer: Self-pay

## 2021-02-05 ENCOUNTER — Ambulatory Visit (INDEPENDENT_AMBULATORY_CARE_PROVIDER_SITE_OTHER): Payer: MEDICARE | Admitting: Internal Medicine

## 2021-02-05 DIAGNOSIS — C3491 Malignant neoplasm of unspecified part of right bronchus or lung: Secondary | ICD-10-CM

## 2021-02-05 DIAGNOSIS — I4891 Unspecified atrial fibrillation: Secondary | ICD-10-CM

## 2021-02-05 DIAGNOSIS — I679 Cerebrovascular disease, unspecified: Secondary | ICD-10-CM

## 2021-02-05 DIAGNOSIS — I2510 Atherosclerotic heart disease of native coronary artery without angina pectoris: Secondary | ICD-10-CM

## 2021-02-05 DIAGNOSIS — Z7901 Long term (current) use of anticoagulants: Secondary | ICD-10-CM | POA: Diagnosis not present

## 2021-02-05 LAB — COMPREHENSIVE METABOLIC PANEL
ALT: 15 U/L (ref 0–53)
AST: 19 U/L (ref 0–37)
Albumin: 4.1 g/dL (ref 3.5–5.2)
Alkaline Phosphatase: 36 U/L — ABNORMAL LOW (ref 39–117)
BUN: 16 mg/dL (ref 6–23)
CO2: 31 mEq/L (ref 19–32)
Calcium: 9.4 mg/dL (ref 8.4–10.5)
Chloride: 105 mEq/L (ref 96–112)
Creatinine, Ser: 1.09 mg/dL (ref 0.40–1.50)
GFR: 61.65 mL/min (ref >60.00)
Glucose, Bld: 86 mg/dL (ref 70–99)
Potassium: 4.6 mEq/L (ref 3.5–5.1)
Sodium: 141 mEq/L (ref 135–145)
Total Bilirubin: 1.1 mg/dL (ref 0.2–1.2)
Total Protein: 6.9 g/dL (ref 6.0–8.3)

## 2021-02-05 LAB — CBC WITH DIFFERENTIAL/PLATELET
Basophils Absolute: 0 10*3/uL (ref 0.0–0.1)
Basophils Relative: 0.4 % (ref 0.0–3.0)
Eosinophils Absolute: 0.1 10*3/uL (ref 0.0–0.7)
Eosinophils Relative: 1.4 % (ref 0.0–5.0)
HCT: 43.1 % (ref 39.0–52.0)
Hemoglobin: 14 g/dL (ref 13.0–17.0)
Lymphocytes Relative: 32.1 % (ref 12.0–46.0)
Lymphs Abs: 2.2 10*3/uL (ref 0.7–4.0)
MCHC: 32.5 g/dL (ref 30.0–36.0)
MCV: 92.3 fl (ref 78.0–100.0)
Monocytes Absolute: 0.6 10*3/uL (ref 0.1–1.0)
Monocytes Relative: 8.9 % (ref 3.0–12.0)
Neutro Abs: 3.9 10*3/uL (ref 1.4–7.7)
Neutrophils Relative %: 57.2 % (ref 43.0–77.0)
Platelets: 129 10*3/uL — ABNORMAL LOW (ref 150.0–400.0)
RBC: 4.67 Mil/uL (ref 4.22–5.81)
RDW: 14.1 % (ref 11.5–15.5)
WBC: 6.8 10*3/uL (ref 4.0–10.5)

## 2021-02-05 LAB — LIPID PANEL
Cholesterol: 107 mg/dL (ref 0–200)
HDL: 40.7 mg/dL (ref >39.00)
LDL Cholesterol: 52 mg/dL (ref 0–99)
NonHDL: 66.73
Total CHOL/HDL Ratio: 3
Triglycerides: 74 mg/dL (ref 0.0–149.0)
VLDL: 14.8 mg/dL (ref 0.0–40.0)

## 2021-02-05 LAB — URINALYSIS
Hgb urine dipstick: NEGATIVE
Leukocytes,Ua: NEGATIVE
Nitrite: NEGATIVE
Specific Gravity, Urine: 1.02 (ref 1.000–1.030)
Urine Glucose: NEGATIVE
Urobilinogen, UA: 1 (ref 0.0–1.0)
pH: 7.5 (ref 5.0–8.0)

## 2021-02-05 LAB — TSH: TSH: 2.86 u[IU]/mL (ref 0.35–5.50)

## 2021-02-05 MED ORDER — FINASTERIDE 5 MG PO TABS: 5.0000 mg | ORAL_TABLET | Freq: Every day | ORAL | 3 refills | Status: DC

## 2021-02-05 MED ORDER — NITROGLYCERIN 0.4 MG SL SUBL: 0.4000 mg | SUBLINGUAL_TABLET | SUBLINGUAL | 2 refills | Status: DC | PRN

## 2021-02-05 MED ORDER — SIMVASTATIN 20 MG PO TABS: 20.0000 mg | ORAL_TABLET | Freq: Every day | ORAL | 3 refills | Status: DC

## 2021-02-05 MED ORDER — WARFARIN SODIUM 5 MG PO TABS: ORAL_TABLET | ORAL | 3 refills | Status: DC

## 2021-02-05 MED ORDER — TERAZOSIN HCL 5 MG PO CAPS: 5.0000 mg | ORAL_CAPSULE | Freq: Every day | ORAL | 3 refills | Status: DC

## 2021-02-05 MED ORDER — DIAZEPAM 5 MG PO TABS: 5.0000 mg | ORAL_TABLET | Freq: Two times a day (BID) | ORAL | 1 refills | Status: DC | PRN

## 2021-02-05 NOTE — Assessment & Plan Note (Signed)
Cont on Coumadin

## 2021-02-05 NOTE — Assessment & Plan Note (Addendum)
S/p lobectomy July '2010 - stage 1A non-small cell adenocarcinoma. CXR every summer

## 2021-02-05 NOTE — Progress Notes (Signed)
Subjective:  Patient ID: Bernard [name], male    DOB: 12-17-1932  Age: 87 y.o. MRN: [id number]  CC: Follow-up (6 month f/u) and Medication Refill (Diazepam, Finasteride, Nitroglycerin, Simvastatin, Terazosin, and coumadin)   HPI Bernard [name] presents for CVD, PAF, h/o lung cancer, CAD  Outpatient Medications Prior to Visit  Medication Sig Dispense Refill   cholecalciferol (VITAMIN D) 1000 UNITS tablet Take 1,000 Units by mouth daily.     multivitamin-lutein (OCUVITE-LUTEIN) CAPS Take 1 capsule by mouth daily.     omeprazole (PRILOSEC) 20 MG capsule Take 20 mg by mouth daily as needed (heartburn or acid reflux).     diazepam (VALIUM) 5 MG tablet Take 1 tablet (5 mg total) by mouth 2 (two) times daily as needed for anxiety. 180 tablet 1   finasteride (PROSCAR) 5 MG tablet Take 1 tablet (5 mg total) by mouth daily. 90 tablet 3   nitroGLYCERIN (NITROSTAT) 0.4 MG SL tablet Place 1 tablet (0.4 mg total) under the tongue every 5 (five) minutes as needed for chest pain (3 DOSES MAX). 25 tablet 2   simvastatin (ZOCOR) 20 MG tablet Take 1 tablet (20 mg total) by mouth daily. 90 tablet 3   terazosin (HYTRIN) 5 MG capsule Take 1 capsule (5 mg total) by mouth daily. 90 capsule 3   warfarin (COUMADIN) 5 MG tablet Take 1 tablet daily or Take as directed by anticoagulation clinic. 90 tablet 3   No facility-administered medications prior to visit.    ROS: Review of Systems  Constitutional:  Negative for appetite change, fatigue and unexpected weight change.  HENT:  Negative for congestion, nosebleeds, sneezing, sore throat and trouble swallowing.   Eyes:  Negative for itching and visual disturbance.  Respiratory:  Negative for cough.   Cardiovascular:  Negative for chest pain, palpitations and leg swelling.  Gastrointestinal:  Negative for abdominal distention, blood in stool, diarrhea and nausea.  Genitourinary:  Negative for frequency and hematuria.  Musculoskeletal:  Positive for  arthralgias. Negative for back pain, gait problem, joint swelling and neck pain.  Skin:  Negative for rash.  Neurological:  Negative for dizziness, tremors, speech difficulty and weakness.  Psychiatric/Behavioral:  Negative for agitation, dysphoric mood, sleep disturbance and suicidal ideas. The patient is not nervous/anxious.    Objective:  BP 110/62 (BP Location: Left Arm)    Pulse 79    Temp 98.6 F (37 C) (Oral)    Ht 5\' 10"  (1.778 m)    Wt 210 lb 9.6 oz (95.5 kg)    SpO2 95%    BMI 30.22 kg/m   BP Readings from Last 3 Encounters:  02/05/21 110/62  01/03/21 96/60  08/05/20 122/82    Wt Readings from Last 3 Encounters:  02/05/21 210 lb 9.6 oz (95.5 kg)  01/03/21 210 lb (95.3 kg)  08/05/20 208 lb 3.2 oz (94.4 kg)    Physical Exam Constitutional:      General: He is not in acute distress.    Appearance: He is well-developed.     Comments: NAD  Eyes:     Conjunctiva/sclera: Conjunctivae normal.     Pupils: Pupils are equal, round, and reactive to light.  Neck:     Thyroid: No thyromegaly.     Vascular: No JVD.  Cardiovascular:     Rate and Rhythm: Normal rate and regular rhythm.     Heart sounds: Normal heart sounds. No murmur heard.   No friction rub. No gallop.  Pulmonary:     Effort:  Pulmonary effort is normal. No respiratory distress.     Breath sounds: Normal breath sounds. No wheezing or rales.  Chest:     Chest wall: No tenderness.  Abdominal:     General: Bowel sounds are normal. There is no distension.     Palpations: Abdomen is soft. There is no mass.     Tenderness: no abdominal tenderness There is no guarding or rebound.  Musculoskeletal:        General: No tenderness. Normal range of motion.     Cervical back: Normal range of motion.  Lymphadenopathy:     Cervical: No cervical adenopathy.  Skin:    General: Skin is warm and dry.     Findings: No rash.  Neurological:     Mental Status: He is alert and oriented to person, place, and time.     Cranial  Nerves: No cranial nerve deficit.     Motor: No abnormal muscle tone.     Coordination: Coordination normal.     Gait: Gait abnormal.     Deep Tendon Reflexes: Reflexes are normal and symmetric.  Psychiatric:        Behavior: Behavior normal.        Thought Content: Thought content normal.        Judgment: Judgment normal.    Lab Results  Component Value Date   WBC 7.3 08/05/2020   HGB 14.6 08/05/2020   HCT 44.5 08/05/2020   PLT 126.0 (L) 08/05/2020   GLUCOSE 97 08/05/2020   CHOL 123 08/05/2020   TRIG 88.0 08/05/2020   HDL 38.30 (L) 08/05/2020   LDLCALC 67 08/05/2020   ALT 14 08/05/2020   AST 19 08/05/2020   NA 142 08/05/2020   K 4.3 08/05/2020   CL 106 08/05/2020   CREATININE 1.13 08/05/2020   BUN 17 08/05/2020   CO2 29 08/05/2020   TSH 2.90 08/05/2020   PSA 0.37 08/05/2020   INR 2.9 01/21/2021   HGBA1C (H) 10/23/2009    6.0 (NOTE)                                                                       According to the ADA Clinical Practice Recommendations for 2011, when HbA1c is used as a screening test:   >=6.5%   Diagnostic of Diabetes Mellitus           (if abnormal result  is confirmed)  5.7-6.4%   Increased risk of developing Diabetes Mellitus  References:Diagnosis and Classification of Diabetes Mellitus,Diabetes MWNU,2725,36(UYQIH 1):S62-S69 and Standards of Medical Care in         Diabetes - 2011,Diabetes Care,2011,34  (Suppl 1):S11-S61.    DG Chest 2 View  Result Date: 08/09/2018 CLINICAL DATA:  No chest complaints. EXAM: CHEST - 2 VIEW COMPARISON:  Chest radiograph 09/13/2017 FINDINGS: Stable enlarged cardiac and mediastinal contours. Similar-appearing bibasilar linear opacities, likely representing scarring. Elevation right hemidiaphragm. Thoracic spine degenerative changes. No pleural effusion or pneumothorax. IMPRESSION: No acute cardiopulmonary process. Electronically Signed   By: Lovey Newcomer M.D.   On: 08/09/2018 17:23    Assessment & Plan:   Problem List  Items Addressed This Visit     ATRIAL FIBRILLATION, PAROXYSMAL    Cont on Coumadin  Relevant Medications   nitroGLYCERIN (NITROSTAT) 0.4 MG SL tablet   simvastatin (ZOCOR) 20 MG tablet   terazosin (HYTRIN) 5 MG capsule   warfarin (COUMADIN) 5 MG tablet   Other Relevant Orders   TSH   Urinalysis   CBC with Differential/Platelet   Comprehensive metabolic panel   Lipid panel   Carcinoma of right lung [facility])    S/p lobectomy July '2010 - stage 1A non-small cell adenocarcinoma. CXR every summer      Relevant Medications   diazepam (VALIUM) 5 MG tablet   warfarin (COUMADIN) 5 MG tablet   CEREBROVASCULAR DISEASE    Cont w/Coumadin      Relevant Medications   nitroGLYCERIN (NITROSTAT) 0.4 MG SL tablet   simvastatin (ZOCOR) 20 MG tablet   terazosin (HYTRIN) 5 MG capsule   warfarin (COUMADIN) 5 MG tablet   Other Relevant Orders   TSH   Urinalysis   CBC with Differential/Platelet   Comprehensive metabolic panel   Lipid panel   Coronary atherosclerosis    Not on ASA (on Coumadin), NTG prn On Simvastatin      Relevant Medications   nitroGLYCERIN (NITROSTAT) 0.4 MG SL tablet   simvastatin (ZOCOR) 20 MG tablet   terazosin (HYTRIN) 5 MG capsule   warfarin (COUMADIN) 5 MG tablet   Long term (current) use of anticoagulants      Meds ordered this encounter  Medications   diazepam (VALIUM) 5 MG tablet    Sig: Take 1 tablet (5 mg total) by mouth 2 (two) times daily as needed for anxiety.    Dispense:  180 tablet    Refill:  1   finasteride (PROSCAR) 5 MG tablet    Sig: Take 1 tablet (5 mg total) by mouth daily.    Dispense:  90 tablet    Refill:  3   nitroGLYCERIN (NITROSTAT) 0.4 MG SL tablet    Sig: Place 1 tablet (0.4 mg total) under the tongue every 5 (five) minutes as needed for chest pain (3 DOSES MAX).    Dispense:  25 tablet    Refill:  2   simvastatin (ZOCOR) 20 MG tablet    Sig: Take 1 tablet (20 mg total) by mouth daily.    Dispense:  90 tablet     Refill:  3   terazosin (HYTRIN) 5 MG capsule    Sig: Take 1 capsule (5 mg total) by mouth daily.    Dispense:  90 capsule    Refill:  3   warfarin (COUMADIN) 5 MG tablet    Sig: Take 1 tablet daily or Take as directed by anticoagulation clinic.    Dispense:  90 tablet    Refill:  3    90 day (OK to fill with Warfarin)       Follow-up: No follow-ups on file.  [name], MD

## 2021-02-05 NOTE — Assessment & Plan Note (Signed)
Not on ASA (on Coumadin), NTG prn On Simvastatin

## 2021-02-05 NOTE — Assessment & Plan Note (Signed)
Cont w/Coumadin

## 2021-03-04 ENCOUNTER — Ambulatory Visit (INDEPENDENT_AMBULATORY_CARE_PROVIDER_SITE_OTHER): Payer: MEDICARE

## 2021-03-04 ENCOUNTER — Other Ambulatory Visit: Payer: Self-pay

## 2021-03-04 DIAGNOSIS — Z7901 Long term (current) use of anticoagulants: Secondary | ICD-10-CM

## 2021-03-04 LAB — POCT INR: INR: 2.9 (ref 2.0–3.0)

## 2021-03-04 NOTE — Progress Notes (Addendum)
Continue to take 1/2 tablet daily except for 1 tablet on Monday and Fridays.  Re-check in 6 weeks.  Medical screening examination/treatment/procedure(s) were performed by non-physician practitioner and as supervising physician I was immediately available for consultation/collaboration.  I agree with above. Lew Dawes, MD

## 2021-03-04 NOTE — Patient Instructions (Addendum)
Pre visit review using our clinic review tool, if applicable. No additional management support is needed unless otherwise documented below in the visit note.  Continue to take 1/2 tablet daily except for 1 tablet on Monday and Fridays.  Re-check in 6 weeks.

## 2021-04-15 ENCOUNTER — Other Ambulatory Visit: Payer: Self-pay

## 2021-04-15 ENCOUNTER — Ambulatory Visit (INDEPENDENT_AMBULATORY_CARE_PROVIDER_SITE_OTHER): Payer: MEDICARE

## 2021-04-15 DIAGNOSIS — Z7901 Long term (current) use of anticoagulants: Secondary | ICD-10-CM | POA: Diagnosis not present

## 2021-04-15 LAB — POCT INR: INR: 3.1 — AB (ref 2.0–3.0)

## 2021-04-15 NOTE — Progress Notes (Addendum)
Hold dose today and then continue to take 1/2 tablet daily except for 1 tablet on Monday and Fridays.  Re-check in 4 weeks.  Medical screening examination/treatment/procedure(s) were performed by non-physician practitioner and as supervising physician I was immediately available for consultation/collaboration.  I agree with above. Lew Dawes, MD

## 2021-04-15 NOTE — Patient Instructions (Addendum)
Pre visit review using our clinic review tool, if applicable. No additional management support is needed unless otherwise documented below in the visit note.  Hold dose today and then continue to take 1/2 tablet daily except for 1 tablet on Monday and Fridays.  Re-check in 4 weeks.

## 2021-05-16 ENCOUNTER — Other Ambulatory Visit: Payer: Self-pay

## 2021-05-16 ENCOUNTER — Ambulatory Visit (INDEPENDENT_AMBULATORY_CARE_PROVIDER_SITE_OTHER): Payer: MEDICARE

## 2021-05-16 DIAGNOSIS — Z7901 Long term (current) use of anticoagulants: Secondary | ICD-10-CM

## 2021-05-16 LAB — POCT INR: INR: 4.2 — AB (ref 2.0–3.0)

## 2021-05-16 NOTE — Progress Notes (Signed)
Hold dose today and hold dose tomorrow and then change weekly dose to take 1/2 tablet daily except take 1 tablet on Mondays. Re-check in 2 weeks. ?

## 2021-05-16 NOTE — Patient Instructions (Addendum)
Pre visit review using our clinic review tool, if applicable. No additional management support is needed unless otherwise documented below in the visit note. ? ?Hold dose today and hold dose tomorrow and then change weekly dose to take 1/2 tablet daily except take 1 tablet on Mondays. Re-check in 2 weeks. ?

## 2021-05-27 ENCOUNTER — Ambulatory Visit (INDEPENDENT_AMBULATORY_CARE_PROVIDER_SITE_OTHER): Payer: MEDICARE

## 2021-05-27 DIAGNOSIS — Z7901 Long term (current) use of anticoagulants: Secondary | ICD-10-CM | POA: Diagnosis not present

## 2021-05-27 LAB — POCT INR: INR: 2.4 (ref 2.0–3.0)

## 2021-05-27 NOTE — Patient Instructions (Addendum)
Pre visit review using our clinic review tool, if applicable. No additional management support is needed unless otherwise documented below in the visit note. ? ?Continue 1/2 tablet daily except take 1 tablet on Mondays. Re-check in 4 weeks.  ?

## 2021-05-27 NOTE — Progress Notes (Addendum)
Continue 1/2 tablet daily except take 1 tablet on Mondays. Re-check in 4 weeks.  ? ?Medical screening examination/treatment/procedure(s) were performed by non-physician practitioner and as supervising physician I was immediately available for consultation/collaboration.  I agree with above. Lew Dawes, MD ?

## 2021-06-24 ENCOUNTER — Ambulatory Visit (INDEPENDENT_AMBULATORY_CARE_PROVIDER_SITE_OTHER): Payer: MEDICARE

## 2021-06-24 DIAGNOSIS — Z7901 Long term (current) use of anticoagulants: Secondary | ICD-10-CM

## 2021-06-24 LAB — POCT INR: INR: 2.4 (ref 2.0–3.0)

## 2021-06-24 NOTE — Patient Instructions (Addendum)
Pre visit review using our clinic review tool, if applicable. No additional management support is needed unless otherwise documented below in the visit note. ? ?Continue 1/2 tablet daily except take 1 tablet on Mondays. Re-check in 6 weeks.  ?

## 2021-06-24 NOTE — Progress Notes (Addendum)
Continue 1/2 tablet daily except take 1 tablet on Mondays. Re-check in 6 weeks.  ? ?Medical screening examination/treatment/procedure(s) were performed by non-physician practitioner and as supervising physician I was immediately available for consultation/collaboration.  I agree with above. Lew Dawes, MD ?

## 2021-08-05 ENCOUNTER — Ambulatory Visit (INDEPENDENT_AMBULATORY_CARE_PROVIDER_SITE_OTHER): Payer: MEDICARE

## 2021-08-05 DIAGNOSIS — Z7901 Long term (current) use of anticoagulants: Secondary | ICD-10-CM

## 2021-08-05 LAB — POCT INR: INR: 2.4 (ref 2.0–3.0)

## 2021-08-05 NOTE — Patient Instructions (Addendum)
Pre visit review using our clinic review tool, if applicable. No additional management support is needed unless otherwise documented below in the visit note.  Continue 1/2 tablet daily except take 1 tablet on Mondays. Re-check in 6 weeks.

## 2021-08-05 NOTE — Progress Notes (Cosign Needed Addendum)
Continue 1/2 tablet daily except take 1 tablet on Mondays. Re-check in 6 weeks.   Medical screening examination/treatment/procedure(s) were performed by non-physician practitioner and as supervising physician I was immediately available for consultation/collaboration.  I agree with above. Lew Dawes, MD

## 2021-08-06 ENCOUNTER — Ambulatory Visit (INDEPENDENT_AMBULATORY_CARE_PROVIDER_SITE_OTHER): Payer: MEDICARE | Admitting: Internal Medicine

## 2021-08-06 ENCOUNTER — Encounter: Payer: Self-pay | Admitting: Internal Medicine

## 2021-08-06 ENCOUNTER — Ambulatory Visit (INDEPENDENT_AMBULATORY_CARE_PROVIDER_SITE_OTHER): Payer: MEDICARE

## 2021-08-06 DIAGNOSIS — M25562 Pain in left knee: Secondary | ICD-10-CM

## 2021-08-06 DIAGNOSIS — M25561 Pain in right knee: Secondary | ICD-10-CM

## 2021-08-06 DIAGNOSIS — C3491 Malignant neoplasm of unspecified part of right bronchus or lung: Secondary | ICD-10-CM | POA: Diagnosis not present

## 2021-08-06 DIAGNOSIS — M1712 Unilateral primary osteoarthritis, left knee: Secondary | ICD-10-CM

## 2021-08-06 DIAGNOSIS — E785 Hyperlipidemia, unspecified: Secondary | ICD-10-CM | POA: Diagnosis not present

## 2021-08-06 DIAGNOSIS — I679 Cerebrovascular disease, unspecified: Secondary | ICD-10-CM | POA: Diagnosis not present

## 2021-08-06 DIAGNOSIS — I10 Essential (primary) hypertension: Secondary | ICD-10-CM

## 2021-08-06 DIAGNOSIS — G8929 Other chronic pain: Secondary | ICD-10-CM

## 2021-08-06 LAB — LIPID PANEL
Cholesterol: 114 mg/dL (ref 0–200)
HDL: 39.7 mg/dL (ref >39.00)
LDL Cholesterol: 60 mg/dL (ref 0–99)
NonHDL: 74.38
Total CHOL/HDL Ratio: 3
Triglycerides: 70 mg/dL (ref 0.0–149.0)
VLDL: 14 mg/dL (ref 0.0–40.0)

## 2021-08-06 LAB — COMPREHENSIVE METABOLIC PANEL
ALT: 16 U/L (ref 0–53)
AST: 21 U/L (ref 0–37)
Albumin: 4.2 g/dL (ref 3.5–5.2)
Alkaline Phosphatase: 38 U/L — ABNORMAL LOW (ref 39–117)
BUN: 19 mg/dL (ref 6–23)
CO2: 29 mEq/L (ref 19–32)
Calcium: 9.2 mg/dL (ref 8.4–10.5)
Chloride: 105 mEq/L (ref 96–112)
Creatinine, Ser: 1.04 mg/dL (ref 0.40–1.50)
GFR: 65 mL/min (ref >60.00)
Glucose, Bld: 96 mg/dL (ref 70–99)
Potassium: 4.7 mEq/L (ref 3.5–5.1)
Sodium: 139 mEq/L (ref 135–145)
Total Bilirubin: 1 mg/dL (ref 0.2–1.2)
Total Protein: 6.8 g/dL (ref 6.0–8.3)

## 2021-08-06 LAB — TSH: TSH: 2.95 u[IU]/mL (ref 0.35–5.50)

## 2021-08-06 NOTE — Progress Notes (Signed)
Subjective:  Patient ID: Bernard [name], male    DOB: 09-01-1932  Age: 88 y.o. MRN: [id number]  CC: No chief complaint on file.   HPI NEEKO PHARO presents for OA, post-B TKR pain, CAD, HTN  Outpatient Medications Prior to Visit  Medication Sig Dispense Refill  . cholecalciferol (VITAMIN D) 1000 UNITS tablet Take 1,000 Units by mouth daily.    . diazepam (VALIUM) 5 MG tablet Take 1 tablet (5 mg total) by mouth 2 (two) times daily as needed for anxiety. 180 tablet 1  . finasteride (PROSCAR) 5 MG tablet Take 1 tablet (5 mg total) by mouth daily. 90 tablet 3  . multivitamin-lutein (OCUVITE-LUTEIN) CAPS Take 1 capsule by mouth daily.    . nitroGLYCERIN (NITROSTAT) 0.4 MG SL tablet Place 1 tablet (0.4 mg total) under the tongue every 5 (five) minutes as needed for chest pain (3 DOSES MAX). 25 tablet 2  . omeprazole (PRILOSEC) 20 MG capsule Take 20 mg by mouth daily as needed (heartburn or acid reflux).    . simvastatin (ZOCOR) 20 MG tablet Take 1 tablet (20 mg total) by mouth daily. 90 tablet 3  . terazosin (HYTRIN) 5 MG capsule Take 1 capsule (5 mg total) by mouth daily. 90 capsule 3  . warfarin (COUMADIN) 5 MG tablet Take 1 tablet daily or Take as directed by anticoagulation clinic. 90 tablet 3   No facility-administered medications prior to visit.    ROS: Review of Systems  Constitutional:  Negative for appetite change, fatigue and unexpected weight change.  HENT:  Negative for congestion, nosebleeds, sneezing, sore throat and trouble swallowing.   Eyes:  Negative for itching and visual disturbance.  Respiratory:  Negative for cough.   Cardiovascular:  Negative for chest pain, palpitations and leg swelling.  Gastrointestinal:  Negative for abdominal distention, blood in stool, diarrhea and nausea.  Genitourinary:  Negative for frequency and hematuria.  Musculoskeletal:  Positive for arthralgias and gait problem. Negative for back pain, joint swelling and neck pain.  Skin:   Negative for rash.  Neurological:  Negative for dizziness, tremors, speech difficulty and weakness.  Psychiatric/Behavioral:  Negative for agitation, dysphoric mood and sleep disturbance. The patient is not nervous/anxious.     Objective:  BP 120/90 (BP Location: Left Arm, Patient Position: Sitting, Cuff Size: Normal)   Pulse 63   Temp 98 F (36.7 C) (Oral)   Ht 5\' 10"  (1.778 m)   Wt 213 lb (96.6 kg)   SpO2 98%   BMI 30.56 kg/m   BP Readings from Last 3 Encounters:  08/06/21 120/90  02/05/21 110/62  01/03/21 96/60    Wt Readings from Last 3 Encounters:  08/06/21 213 lb (96.6 kg)  02/05/21 210 lb 9.6 oz (95.5 kg)  01/03/21 210 lb (95.3 kg)    Physical Exam Constitutional:      General: He is not in acute distress.    Appearance: He is well-developed.     Comments: NAD  Eyes:     Conjunctiva/sclera: Conjunctivae normal.     Pupils: Pupils are equal, round, and reactive to light.  Neck:     Thyroid: No thyromegaly.     Vascular: No JVD.  Cardiovascular:     Rate and Rhythm: Normal rate and regular rhythm.     Heart sounds: Normal heart sounds. No murmur heard.    No friction rub. No gallop.  Pulmonary:     Effort: Pulmonary effort is normal. No respiratory distress.     Breath  sounds: Normal breath sounds. No wheezing or rales.  Chest:     Chest wall: No tenderness.  Abdominal:     General: Bowel sounds are normal. There is no distension.     Palpations: Abdomen is soft. There is no mass.     Tenderness: There is no abdominal tenderness. There is no guarding or rebound.  Musculoskeletal:        General: No tenderness. Normal range of motion.     Cervical back: Normal range of motion.  Lymphadenopathy:     Cervical: No cervical adenopathy.  Skin:    General: Skin is warm and dry.     Findings: No rash.  Neurological:     Mental Status: He is alert and oriented to person, place, and time.     Cranial Nerves: No cranial nerve deficit.     Motor: No abnormal  muscle tone.     Coordination: Coordination abnormal.     Gait: Gait abnormal.     Deep Tendon Reflexes: Reflexes are normal and symmetric.  Psychiatric:        Behavior: Behavior normal.        Thought Content: Thought content normal.        Judgment: Judgment normal.  B knee scars  Lab Results  Component Value Date   WBC 6.8 02/05/2021   HGB 14.0 02/05/2021   HCT 43.1 02/05/2021   PLT 129.0 (L) 02/05/2021   GLUCOSE 86 02/05/2021   CHOL 107 02/05/2021   TRIG 74.0 02/05/2021   HDL 40.70 02/05/2021   LDLCALC 52 02/05/2021   ALT 15 02/05/2021   AST 19 02/05/2021   NA 141 02/05/2021   K 4.6 02/05/2021   CL 105 02/05/2021   CREATININE 1.09 02/05/2021   BUN 16 02/05/2021   CO2 31 02/05/2021   TSH 2.86 02/05/2021   PSA 0.37 08/05/2020   INR 2.4 08/05/2021   HGBA1C (H) 10/23/2009    6.0 (NOTE)                                                                       According to the ADA Clinical Practice Recommendations for 2011, when HbA1c is used as a screening test:   >=6.5%   Diagnostic of Diabetes Mellitus           (if abnormal result  is confirmed)  5.7-6.4%   Increased risk of developing Diabetes Mellitus  References:Diagnosis and Classification of Diabetes Mellitus,Diabetes VOJJ,0093,81(WEXHB 1):S62-S69 and Standards of Medical Care in         Diabetes - 2011,Diabetes Care,2011,34  (Suppl 1):S11-S61.    DG Chest 2 View  Result Date: 08/09/2018 CLINICAL DATA:  No chest complaints. EXAM: CHEST - 2 VIEW COMPARISON:  Chest radiograph 09/13/2017 FINDINGS: Stable enlarged cardiac and mediastinal contours. Similar-appearing bibasilar linear opacities, likely representing scarring. Elevation right hemidiaphragm. Thoracic spine degenerative changes. No pleural effusion or pneumothorax. IMPRESSION: No acute cardiopulmonary process. Electronically Signed   By: Lovey Newcomer M.D.   On: 08/09/2018 17:23    Assessment & Plan:   Problem List Items Addressed This Visit     Carcinoma of  right lung ([place])    Will get a CXR      Relevant Orders   DG  Chest 2 View   Comprehensive metabolic panel   TSH   Lipid panel   CEREBROVASCULAR DISEASE    Cont w/Coumadin      Dyslipidemia    Cont on simvastatin 20 mg daily      Relevant Orders   Comprehensive metabolic panel   TSH   Lipid panel   Essential hypertension    Cont on Terazosin      Relevant Orders   Comprehensive metabolic panel   TSH   Lipid panel   OA (osteoarthritis) of knee    OA, post-B TKR pain >10 years Blue-Emu cream --use 2-3 times a day Make a rice heating pad sock         No orders of the defined types were placed in this encounter.     Follow-up: Return in about 6 months (around 02/05/2022) for Wellness Exam.  [name], MD

## 2021-08-06 NOTE — Assessment & Plan Note (Signed)
Cont w/Coumadin

## 2021-08-06 NOTE — Assessment & Plan Note (Signed)
Will get a CXR

## 2021-08-06 NOTE — Assessment & Plan Note (Signed)
Blue-Emu cream --use 2-3 times a day Make a rice heating pad sock

## 2021-08-06 NOTE — Assessment & Plan Note (Signed)
Cont on Terazosin

## 2021-08-06 NOTE — Assessment & Plan Note (Signed)
Cont on simvastatin 20 mg daily

## 2021-08-06 NOTE — Patient Instructions (Addendum)
Blue-Emu cream --use 2-3 times a day Make a rice heating pad sock

## 2021-08-06 NOTE — Assessment & Plan Note (Signed)
OA, post-B TKR pain >10 years Blue-Emu cream --use 2-3 times a day Make a rice heating pad sock

## 2021-09-16 ENCOUNTER — Ambulatory Visit (INDEPENDENT_AMBULATORY_CARE_PROVIDER_SITE_OTHER): Payer: MEDICARE

## 2021-09-16 DIAGNOSIS — Z7901 Long term (current) use of anticoagulants: Secondary | ICD-10-CM | POA: Diagnosis not present

## 2021-09-16 LAB — POCT INR: INR: 1.7 — AB (ref 2.0–3.0)

## 2021-09-16 NOTE — Progress Notes (Signed)
Increase dose to take 1 tablet today and the continue 1/2 tablet daily except take 1 tablet on Mondays. Re-check in 4 weeks.

## 2021-09-16 NOTE — Patient Instructions (Addendum)
Pre visit review using our clinic review tool, if applicable. No additional management support is needed unless otherwise documented below in the visit note.  Increase dose to take 1 tablet today and the continue 1/2 tablet daily except take 1 tablet on Mondays. Re-check in 4 weeks.

## 2021-10-14 ENCOUNTER — Ambulatory Visit (INDEPENDENT_AMBULATORY_CARE_PROVIDER_SITE_OTHER): Payer: MEDICARE

## 2021-10-14 DIAGNOSIS — Z7901 Long term (current) use of anticoagulants: Secondary | ICD-10-CM | POA: Diagnosis not present

## 2021-10-14 LAB — POCT INR: INR: 2.4 (ref 2.0–3.0)

## 2021-10-14 NOTE — Progress Notes (Cosign Needed Addendum)
Continue 1/2 tablet daily except take 1 tablet on Mondays. Re-check in 6 weeks.   Medical screening examination/treatment/procedure(s) were performed by non-physician practitioner and as supervising physician I was immediately available for consultation/collaboration.  I agree with above. Lew Dawes, MD

## 2021-10-14 NOTE — Patient Instructions (Addendum)
Pre visit review using our clinic review tool, if applicable. No additional management support is needed unless otherwise documented below in the visit note.  Continue 1/2 tablet daily except take 1 tablet on Mondays. Re-check in 6 weeks.

## 2021-11-25 ENCOUNTER — Ambulatory Visit (INDEPENDENT_AMBULATORY_CARE_PROVIDER_SITE_OTHER): Payer: MEDICARE

## 2021-11-25 DIAGNOSIS — Z23 Encounter for immunization: Secondary | ICD-10-CM

## 2021-11-25 DIAGNOSIS — Z7901 Long term (current) use of anticoagulants: Secondary | ICD-10-CM | POA: Diagnosis not present

## 2021-11-25 LAB — POCT INR: INR: 2.1 (ref 2.0–3.0)

## 2021-11-25 NOTE — Patient Instructions (Signed)
Continue 1/2 tablet daily except take 1 tablet on Mondays. Re-check in 6 weeks.

## 2021-11-25 NOTE — Progress Notes (Addendum)
Continue 1/2 tablet daily except take 1 tablet on Mondays. Re-check in 6 weeks.   Flu shot given in left deltoid per pt request. Tolerated well.   Medical screening examination/treatment/procedure(s) were performed by non-physician practitioner and as supervising physician I was immediately available for consultation/collaboration.  I agree with above. Lew Dawes, MD

## 2021-12-23 ENCOUNTER — Ambulatory Visit: Payer: MEDICARE

## 2022-01-06 ENCOUNTER — Ambulatory Visit (INDEPENDENT_AMBULATORY_CARE_PROVIDER_SITE_OTHER): Payer: MEDICARE

## 2022-01-06 DIAGNOSIS — Z7901 Long term (current) use of anticoagulants: Secondary | ICD-10-CM

## 2022-01-06 LAB — POCT INR: INR: 2.7 (ref 2.0–3.0)

## 2022-01-06 NOTE — Progress Notes (Cosign Needed Addendum)
Continue 1/2 tablet daily except take 1 tablet on Mondays. Recheck in 6 weeks.   Medical screening examination/treatment/procedure(s) were performed by non-physician practitioner and as supervising physician I was immediately available for consultation/collaboration.  I agree with above. Lew Dawes, MD

## 2022-01-06 NOTE — Patient Instructions (Signed)
Continue 1/2 tablet daily except take 1 tablet on Mondays. Recheck on 12/19 at 11:00.

## 2022-01-09 ENCOUNTER — Ambulatory Visit: Payer: MEDICARE

## 2022-02-05 ENCOUNTER — Encounter: Payer: Self-pay | Admitting: Internal Medicine

## 2022-02-05 ENCOUNTER — Ambulatory Visit (INDEPENDENT_AMBULATORY_CARE_PROVIDER_SITE_OTHER): Payer: MEDICARE | Admitting: Internal Medicine

## 2022-02-05 VITALS — BP 118/70 | HR 77 | Temp 97.7°F | Ht 70.0 in | Wt 205.0 lb

## 2022-02-05 DIAGNOSIS — I10 Essential (primary) hypertension: Secondary | ICD-10-CM | POA: Diagnosis not present

## 2022-02-05 DIAGNOSIS — F4321 Adjustment disorder with depressed mood: Secondary | ICD-10-CM

## 2022-02-05 DIAGNOSIS — E785 Hyperlipidemia, unspecified: Secondary | ICD-10-CM | POA: Diagnosis not present

## 2022-02-05 DIAGNOSIS — Z7901 Long term (current) use of anticoagulants: Secondary | ICD-10-CM | POA: Diagnosis not present

## 2022-02-05 LAB — CBC WITH DIFFERENTIAL/PLATELET
Basophils Absolute: 0 10*3/uL (ref 0.0–0.1)
Basophils Relative: 0.5 % (ref 0.0–3.0)
Eosinophils Absolute: 0.2 10*3/uL (ref 0.0–0.7)
Eosinophils Relative: 3.2 % (ref 0.0–5.0)
HCT: 42.3 % (ref 39.0–52.0)
Hemoglobin: 14 g/dL (ref 13.0–17.0)
Lymphocytes Relative: 25.5 % (ref 12.0–46.0)
Lymphs Abs: 1.8 10*3/uL (ref 0.7–4.0)
MCHC: 33.2 g/dL (ref 30.0–36.0)
MCV: 91.8 fl (ref 78.0–100.0)
Monocytes Absolute: 0.6 10*3/uL (ref 0.1–1.0)
Monocytes Relative: 7.9 % (ref 3.0–12.0)
Neutro Abs: 4.4 10*3/uL (ref 1.4–7.7)
Neutrophils Relative %: 62.9 % (ref 43.0–77.0)
Platelets: 146 10*3/uL — ABNORMAL LOW (ref 150.0–400.0)
RBC: 4.6 Mil/uL (ref 4.22–5.81)
RDW: 14.4 % (ref 11.5–15.5)
WBC: 7 10*3/uL (ref 4.0–10.5)

## 2022-02-05 LAB — COMPREHENSIVE METABOLIC PANEL
ALT: 17 U/L (ref 0–53)
AST: 22 U/L (ref 0–37)
Albumin: 4.1 g/dL (ref 3.5–5.2)
Alkaline Phosphatase: 40 U/L (ref 39–117)
BUN: 14 mg/dL (ref 6–23)
CO2: 32 mEq/L (ref 19–32)
Calcium: 9.4 mg/dL (ref 8.4–10.5)
Chloride: 104 mEq/L (ref 96–112)
Creatinine, Ser: 0.98 mg/dL (ref 0.40–1.50)
GFR: 69.56 mL/min (ref >60.00)
Glucose, Bld: 103 mg/dL — ABNORMAL HIGH (ref 70–99)
Potassium: 4.5 mEq/L (ref 3.5–5.1)
Sodium: 141 mEq/L (ref 135–145)
Total Bilirubin: 1 mg/dL (ref 0.2–1.2)
Total Protein: 6.7 g/dL (ref 6.0–8.3)

## 2022-02-05 LAB — TSH: TSH: 2.01 u[IU]/mL (ref 0.35–5.50)

## 2022-02-05 MED ORDER — WARFARIN SODIUM 5 MG PO TABS: ORAL_TABLET | ORAL | 3 refills | Status: DC

## 2022-02-05 MED ORDER — TERAZOSIN HCL 5 MG PO CAPS: 5.0000 mg | ORAL_CAPSULE | Freq: Every day | ORAL | 3 refills | Status: DC

## 2022-02-05 MED ORDER — SIMVASTATIN 20 MG PO TABS: 20.0000 mg | ORAL_TABLET | Freq: Every day | ORAL | 3 refills | Status: DC

## 2022-02-05 MED ORDER — FINASTERIDE 5 MG PO TABS: 5.0000 mg | ORAL_TABLET | Freq: Every day | ORAL | 3 refills | Status: DC

## 2022-02-05 NOTE — Progress Notes (Signed)
Subjective:  Patient ID: Bernard [name], male    DOB: 1932/10/30  Age: 88 y.o. MRN: [id number]  CC: Follow-up (6 month f/u- Need written rx for finesteride, simvastatin, terazosin and warfarin)   HPI Bernard [name] presents for dyslipidemia, BPH, anticoagulation  Outpatient Medications Prior to Visit  Medication Sig Dispense Refill   cholecalciferol (VITAMIN D) 1000 UNITS tablet Take 1,000 Units by mouth daily.     diazepam (VALIUM) 5 MG tablet Take 1 tablet (5 mg total) by mouth 2 (two) times daily as needed for anxiety. 180 tablet 1   multivitamin-lutein (OCUVITE-LUTEIN) CAPS Take 1 capsule by mouth daily.     nitroGLYCERIN (NITROSTAT) 0.4 MG SL tablet Place 1 tablet (0.4 mg total) under the tongue every 5 (five) minutes as needed for chest pain (3 DOSES MAX). 25 tablet 2   finasteride (PROSCAR) 5 MG tablet Take 1 tablet (5 mg total) by mouth daily. 90 tablet 3   simvastatin (ZOCOR) 20 MG tablet Take 1 tablet (20 mg total) by mouth daily. 90 tablet 3   terazosin (HYTRIN) 5 MG capsule Take 1 capsule (5 mg total) by mouth daily. 90 capsule 3   warfarin (COUMADIN) 5 MG tablet Take 1 tablet daily or Take as directed by anticoagulation clinic. 90 tablet 3   omeprazole (PRILOSEC) 20 MG capsule Take 20 mg by mouth daily as needed (heartburn or acid reflux). (Patient not taking: Reported on 02/05/2022)     No facility-administered medications prior to visit.    ROS: Review of Systems  Constitutional:  Negative for appetite change, fatigue and unexpected weight change.  HENT:  Negative for congestion, nosebleeds, sneezing, sore throat and trouble swallowing.   Eyes:  Negative for itching and visual disturbance.  Respiratory:  Negative for cough.   Cardiovascular:  Negative for chest pain, palpitations and leg swelling.  Gastrointestinal:  Negative for abdominal distention, blood in stool, diarrhea and nausea.  Genitourinary:  Negative for frequency and hematuria.  Musculoskeletal:   Positive for arthralgias. Negative for back pain, gait problem, joint swelling and neck pain.  Skin:  Negative for rash.  Neurological:  Negative for dizziness, tremors, speech difficulty and weakness.  Psychiatric/Behavioral:  Negative for agitation, dysphoric mood, sleep disturbance and suicidal ideas. The patient is not nervous/anxious.     Objective:  BP 118/70 (BP Location: Left Arm)   Pulse 77   Temp 97.7 F (36.5 C) (Oral)   Ht 5\' 10"  (1.778 m)   Wt 205 lb (93 kg)   SpO2 96%   BMI 29.41 kg/m   BP Readings from Last 3 Encounters:  02/05/22 118/70  08/06/21 120/90  02/05/21 110/62    Wt Readings from Last 3 Encounters:  02/05/22 205 lb (93 kg)  08/06/21 213 lb (96.6 kg)  02/05/21 210 lb 9.6 oz (95.5 kg)    Physical Exam Constitutional:      General: He is not in acute distress.    Appearance: He is well-developed. He is obese.     Comments: NAD  Eyes:     Conjunctiva/sclera: Conjunctivae normal.     Pupils: Pupils are equal, round, and reactive to light.  Neck:     Thyroid: No thyromegaly.     Vascular: No JVD.  Cardiovascular:     Rate and Rhythm: Normal rate and regular rhythm.     Heart sounds: Normal heart sounds. No murmur heard.    No friction rub. No gallop.  Pulmonary:     Effort: Pulmonary effort is  normal. No respiratory distress.     Breath sounds: Normal breath sounds. No wheezing or rales.  Chest:     Chest wall: No tenderness.  Abdominal:     General: Bowel sounds are normal. There is no distension.     Palpations: Abdomen is soft. There is no mass.     Tenderness: There is no abdominal tenderness. There is no guarding or rebound.  Musculoskeletal:        General: No tenderness. Normal range of motion.     Cervical back: Normal range of motion.  Lymphadenopathy:     Cervical: No cervical adenopathy.  Skin:    General: Skin is warm and dry.     Findings: No rash.  Neurological:     Mental Status: He is alert and oriented to person,  place, and time.     Cranial Nerves: No cranial nerve deficit.     Motor: No abnormal muscle tone.     Coordination: Coordination normal.     Gait: Gait normal.     Deep Tendon Reflexes: Reflexes are normal and symmetric.  Psychiatric:        Behavior: Behavior normal.        Thought Content: Thought content normal.        Judgment: Judgment normal.     Lab Results  Component Value Date   WBC 6.8 02/05/2021   HGB 14.0 02/05/2021   HCT 43.1 02/05/2021   PLT 129.0 (L) 02/05/2021   GLUCOSE 96 08/06/2021   CHOL 114 08/06/2021   TRIG 70.0 08/06/2021   HDL 39.70 08/06/2021   LDLCALC 60 08/06/2021   ALT 16 08/06/2021   AST 21 08/06/2021   NA 139 08/06/2021   K 4.7 08/06/2021   CL 105 08/06/2021   CREATININE 1.04 08/06/2021   BUN 19 08/06/2021   CO2 29 08/06/2021   TSH 2.95 08/06/2021   PSA 0.37 08/05/2020   INR 2.7 01/06/2022   HGBA1C (H) 10/23/2009    6.0 (NOTE)                                                                       According to the ADA Clinical Practice Recommendations for 2011, when HbA1c is used as a screening test:   >=6.5%   Diagnostic of Diabetes Mellitus           (if abnormal result  is confirmed)  5.7-6.4%   Increased risk of developing Diabetes Mellitus  References:Diagnosis and Classification of Diabetes Mellitus,Diabetes YNWG,9562,13(YQMVH 1):S62-S69 and Standards of Medical Care in         Diabetes - 2011,Diabetes Care,2011,34  (Suppl 1):S11-S61.    DG Chest 2 View  Result Date: 08/09/2018 CLINICAL DATA:  No chest complaints. EXAM: CHEST - 2 VIEW COMPARISON:  Chest radiograph 09/13/2017 FINDINGS: Stable enlarged cardiac and mediastinal contours. Similar-appearing bibasilar linear opacities, likely representing scarring. Elevation right hemidiaphragm. Thoracic spine degenerative changes. No pleural effusion or pneumothorax. IMPRESSION: No acute cardiopulmonary process. Electronically Signed   By: Lovey Newcomer M.D.   On: 08/09/2018 17:23    Assessment  & Plan:   Problem List Items Addressed This Visit     Situational depression - Primary    Grieving wife's death  Relevant Orders   Comprehensive metabolic panel   TSH   CBC with Differential/Platelet   Long term (current) use of anticoagulants    Cont on Coumadin      Grief    Wife [name] died in 05/22/21 , were married for 54 years      Essential hypertension    Cont on Terazosin      Relevant Medications   simvastatin (ZOCOR) 20 MG tablet   terazosin (HYTRIN) 5 MG capsule   warfarin (COUMADIN) 5 MG tablet   Other Relevant Orders   Comprehensive metabolic panel   TSH   CBC with Differential/Platelet   Dyslipidemia    Cont on simvastatin 20 mg daily      Relevant Medications   simvastatin (ZOCOR) 20 MG tablet   Other Relevant Orders   Comprehensive metabolic panel   TSH   CBC with Differential/Platelet      Meds ordered this encounter  Medications   finasteride (PROSCAR) 5 MG tablet    Sig: Take 1 tablet (5 mg total) by mouth daily.    Dispense:  90 tablet    Refill:  3   simvastatin (ZOCOR) 20 MG tablet    Sig: Take 1 tablet (20 mg total) by mouth daily.    Dispense:  90 tablet    Refill:  3   terazosin (HYTRIN) 5 MG capsule    Sig: Take 1 capsule (5 mg total) by mouth daily.    Dispense:  90 capsule    Refill:  3   warfarin (COUMADIN) 5 MG tablet    Sig: Take 1 tablet daily or Take as directed by anticoagulation clinic.    Dispense:  90 tablet    Refill:  3      Follow-up: Return in about 6 months (around 08/07/2022) for a follow-up visit.  [name], MD

## 2022-02-05 NOTE — Assessment & Plan Note (Signed)
Wife Barbaraann Share died in 2023 , were married for 36 years

## 2022-02-05 NOTE — Progress Notes (Signed)
PCP:  Cassandria Anger, MD Primary Cardiologist: None Electrophysiologist: Will Meredith Leeds, MD    Bernard [name] is a 88 y.o. male seen today for Will Meredith Leeds, MD for routine electrophysiology followup. Since last being seen in our clinic the patient reports doing well overall. He is in grief from the loss of his wife in October.  he denies chest pain, palpitations, dyspnea, PND, orthopnea, nausea, vomiting, dizziness, syncope, edema, weight gain, or early satiety.   Past Medical History:  Diagnosis Date   CAD (coronary artery disease)    s/p MI 1976, normal myoview 7/11   Depressive disorder, not elsewhere classified    Esophageal reflux    Headache    migraines occ., none in months.   History of BPH    benign prostatic hypertrophy   Labyrinthitis, unspecified    Malignant neoplasm middle lobe, bronchus or lung    right lung. Dx 07/27/08, sp/p resection 7/10.    Osteoarthrosis, unspecified whether generalized or localized, lower leg    knee   Other and unspecified hyperlipidemia    Paroxysmal atrial fibrillation (HCC)    postoperative after lung surgery   Renal cyst, right    being followed   Stroke [facility])    TIA March 2000     no side effects now   Unspecified essential hypertension    Past Surgical History:  Procedure Laterality Date   anthroscopic surgery  '10   right knee \   BUNIONECTOMY  '89   left foot   CATARACT EXTRACTION, BILATERAL     ESOPHAGOGASTRODUODENOSCOPY  05/14/04   EYE SURGERY     both eyes - 2011 ([facility])   INGUINAL HERNIA REPAIR  '71   left   right TKR  8/11   TOTAL KNEE ARTHROPLASTY Left 09/24/2014   Procedure: LEFT TOTAL KNEE ARTHROPLASTY;  Surgeon: Gaynelle Arabian, MD;  Location: [facility];  Service: Orthopedics;  Laterality: Left;   VAT-RML lobectomy      Current Outpatient Medications  Medication Sig Dispense Refill   cholecalciferol (VITAMIN D) 1000 UNITS tablet Take 1,000 Units by mouth daily.     diazepam (VALIUM) 5 MG tablet  Take 1 tablet (5 mg total) by mouth 2 (two) times daily as needed for anxiety. 180 tablet 1   finasteride (PROSCAR) 5 MG tablet Take 1 tablet (5 mg total) by mouth daily. 90 tablet 3   multivitamin-lutein (OCUVITE-LUTEIN) CAPS Take 1 capsule by mouth daily.     nitroGLYCERIN (NITROSTAT) 0.4 MG SL tablet Place 1 tablet (0.4 mg total) under the tongue every 5 (five) minutes as needed for chest pain (3 DOSES MAX). 25 tablet 2   simvastatin (ZOCOR) 20 MG tablet Take 1 tablet (20 mg total) by mouth daily. 90 tablet 3   terazosin (HYTRIN) 5 MG capsule Take 1 capsule (5 mg total) by mouth daily. 90 capsule 3   warfarin (COUMADIN) 5 MG tablet Take 1 tablet daily or Take as directed by anticoagulation clinic. 90 tablet 3   No current facility-administered medications for this visit.    No Known Allergies  Social History   Socioeconomic History   Marital status: Married    Spouse name: Not on file   Number of children: Not on file   Years of education: Not on file   Highest education level: Not on file  Occupational History   Occupation: railroad Solicitor: RETIRED    Comment: retired  Tobacco Use   Smoking status: Never  Smokeless tobacco: Never  Vaping Use   Vaping Use: Never used  Substance and Sexual Activity   Alcohol use: No   Drug use: No   Sexual activity: Yes    Partners: Female  Other Topics Concern   Not on file  Social History Narrative   HSG. Married '70. 1 son '75. Lives with wife independently in [place]. 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 [name]. 09/10/09.    Social Determinants of Health   Financial Resource Strain: Not on file  Food Insecurity: Not on file  Transportation Needs: Not on file  Physical Activity: Not on file  Stress: Not on file  Social Connections: Not on file  Intimate Partner Violence: Not on file     Review of Systems: All other systems reviewed and  are otherwise negative except as noted above.  Physical Exam: Vitals:   02/11/22 1104  BP: 122/76  Pulse: 88  SpO2: 95%  Weight: 205 lb 12.8 oz (93.4 kg)  Height: 5\' 10"  (1.778 m)    GEN- The patient is well appearing, alert and oriented x 3 today.   HEENT: normocephalic, atraumatic; sclera clear, conjunctiva pink; hearing intact; oropharynx clear; neck supple, no JVP Lymph- no cervical lymphadenopathy Lungs- Clear to ausculation bilaterally, normal work of breathing.  No wheezes, rales, rhonchi Heart- Irregularly irregular rate and rhythm, no murmurs, rubs or gallops, PMI not laterally displaced GI- soft, non-tender, non-distended, bowel sounds present, no hepatosplenomegaly Extremities- No peripheral edema. no clubbing or cyanosis; DP/PT/radial pulses 2+ bilaterally MS- no significant deformity or atrophy Skin- warm and dry, no rash or lesion Psych- euthymic mood, full affect Neuro- strength and sensation are intact  EKG is ordered. Personal review of EKG from today shows AF at 88 bpm  Additional studies reviewed include: Previous EP notes.   Assessment and Plan:  1. Permanent AF Declines any aggressive management Continue coumadin for CHA2DS2VASc  of at least 4 Labs 02/05/22 reviewed and stable.  2. HTN Stable on current regimen   3. CAD No s/s ischemia Continue ASA Continue statin   Follow up with Dr. Curt Bears in 12 months  Shirley Friar, PA-C  02/11/22 11:07 AM

## 2022-02-05 NOTE — Assessment & Plan Note (Signed)
Cont on Terazosin

## 2022-02-05 NOTE — Assessment & Plan Note (Signed)
Cont on simvastatin 20 mg daily

## 2022-02-05 NOTE — Assessment & Plan Note (Signed)
Grieving wife's death

## 2022-02-05 NOTE — Assessment & Plan Note (Signed)
Cont on Coumadin

## 2022-02-10 ENCOUNTER — Ambulatory Visit (INDEPENDENT_AMBULATORY_CARE_PROVIDER_SITE_OTHER): Payer: MEDICARE

## 2022-02-10 DIAGNOSIS — Z7901 Long term (current) use of anticoagulants: Secondary | ICD-10-CM | POA: Diagnosis not present

## 2022-02-10 LAB — POCT INR: INR: 2.1 (ref 2.0–3.0)

## 2022-02-10 NOTE — Progress Notes (Addendum)
Continue 1/2 tablet daily except take 1 tablet on Mondays. Recheck in 6 weeks.   Medical screening examination/treatment/procedure(s) were performed by non-physician practitioner and as supervising physician I was immediately available for consultation/collaboration.  I agree with above. Lew Dawes, MD

## 2022-02-10 NOTE — Patient Instructions (Addendum)
Pre visit review using our clinic review tool, if applicable. No additional management support is needed unless otherwise documented below in the visit note.  Continue 1/2 tablet daily except take 1 tablet on Mondays. Recheck in 6 weeks.

## 2022-02-11 ENCOUNTER — Ambulatory Visit: Payer: MEDICARE | Attending: Student | Admitting: Student

## 2022-02-11 ENCOUNTER — Encounter: Payer: Self-pay | Admitting: Student

## 2022-02-11 VITALS — BP 122/76 | HR 88 | Ht 70.0 in | Wt 205.8 lb

## 2022-02-11 DIAGNOSIS — I4819 Other persistent atrial fibrillation: Secondary | ICD-10-CM | POA: Insufficient documentation

## 2022-02-11 DIAGNOSIS — I2510 Atherosclerotic heart disease of native coronary artery without angina pectoris: Secondary | ICD-10-CM | POA: Diagnosis not present

## 2022-02-11 DIAGNOSIS — I10 Essential (primary) hypertension: Secondary | ICD-10-CM | POA: Insufficient documentation

## 2022-02-11 NOTE — Patient Instructions (Signed)
Medication Instructions:  Your physician recommends that you continue on your current medications as directed. Please refer to the Current Medication list given to you today.  *If you need a refill on your cardiac medications before your next appointment, please call your pharmacy*   Lab Work: None If you have labs (blood work) drawn today and your tests are completely normal, you will receive your results only by: [place] (if you have MyChart) OR A paper copy in the mail If you have any lab test that is abnormal or we need to change your treatment, we will call you to review the results.   Follow-Up: At [facility], you and your health needs are our priority.  As part of our continuing mission to provide you with exceptional heart care, we have created designated Provider Care Teams.  These Care Teams include your primary Cardiologist (physician) and Advanced Practice Providers (APPs -  Physician Assistants and Nurse Practitioners) who all work together to provide you with the care you need, when you need it.  Your next appointment:   1 year(s)  The format for your next appointment:   In Person  Provider:   Allegra Lai, MD     Important Information About Sugar

## 2022-03-24 ENCOUNTER — Ambulatory Visit (INDEPENDENT_AMBULATORY_CARE_PROVIDER_SITE_OTHER): Payer: MEDICARE

## 2022-03-24 DIAGNOSIS — Z7901 Long term (current) use of anticoagulants: Secondary | ICD-10-CM | POA: Diagnosis not present

## 2022-03-24 LAB — POCT INR: INR: 2.6 (ref 2.0–3.0)

## 2022-03-24 NOTE — Progress Notes (Addendum)
Continue 1/2 tablet daily except take 1 tablet on Mondays. Recheck in 6 weeks.    Medical screening examination/treatment/procedure(s) were performed by non-physician practitioner and as supervising physician I was immediately available for consultation/collaboration.  I agree with above. Lew Dawes, MD

## 2022-03-24 NOTE — Patient Instructions (Addendum)
Pre visit review using our clinic review tool, if applicable. No additional management support is needed unless otherwise documented below in the visit note.  Continue 1/2 tablet daily except take 1 tablet on Mondays. Recheck in 6 weeks.

## 2022-05-05 ENCOUNTER — Ambulatory Visit (INDEPENDENT_AMBULATORY_CARE_PROVIDER_SITE_OTHER): Payer: MEDICARE

## 2022-05-05 DIAGNOSIS — Z7901 Long term (current) use of anticoagulants: Secondary | ICD-10-CM | POA: Diagnosis not present

## 2022-05-05 LAB — POCT INR: INR: 2.7 (ref 2.0–3.0)

## 2022-05-05 NOTE — Progress Notes (Addendum)
Continue 1/2 tablet daily except take 1 tablet on Mondays. Recheck in 6 weeks.   Medical screening examination/treatment/procedure(s) were performed by non-physician practitioner and as supervising physician I was immediately available for consultation/collaboration.  I agree with above. Aleksei Plotnikov, MD 

## 2022-05-05 NOTE — Patient Instructions (Addendum)
Pre visit review using our clinic review tool, if applicable. No additional management support is needed unless otherwise documented below in the visit note.  Continue 1/2 tablet daily except take 1 tablet on Mondays. Recheck in 6 weeks.    

## 2022-06-16 ENCOUNTER — Ambulatory Visit (INDEPENDENT_AMBULATORY_CARE_PROVIDER_SITE_OTHER): Payer: MEDICARE

## 2022-06-16 DIAGNOSIS — Z7901 Long term (current) use of anticoagulants: Secondary | ICD-10-CM | POA: Diagnosis not present

## 2022-06-16 LAB — POCT INR: INR: 3 (ref 2.0–3.0)

## 2022-06-16 NOTE — Patient Instructions (Addendum)
Pre visit review using our clinic review tool, if applicable. No additional management support is needed unless otherwise documented below in the visit note.  Continue 1/2 tablet daily except take 1 tablet on Mondays. Recheck in 6 weeks.    

## 2022-06-16 NOTE — Progress Notes (Addendum)
Continue 1/2 tablet daily except take 1 tablet on Mondays. Recheck in 6 weeks.   Medical screening examination/treatment/procedure(s) were performed by non-physician practitioner and as supervising physician I was immediately available for consultation/collaboration.  I agree with above. Aleksei Plotnikov, MD 

## 2022-07-28 ENCOUNTER — Ambulatory Visit (INDEPENDENT_AMBULATORY_CARE_PROVIDER_SITE_OTHER): Payer: MEDICARE

## 2022-07-28 DIAGNOSIS — Z7901 Long term (current) use of anticoagulants: Secondary | ICD-10-CM

## 2022-07-28 LAB — POCT INR: INR: 3.8 — AB (ref 2.0–3.0)

## 2022-07-28 NOTE — Patient Instructions (Addendum)
Pre visit review using our clinic review tool, if applicable. No additional management support is needed unless otherwise documented below in the visit note.  Hold dose today and the change weekly dose to take 1/2 tablet daily. Recheck in 3 weeks.

## 2022-07-28 NOTE — Progress Notes (Addendum)
Hold dose today and the change weekly dose to take 1/2 tablet daily. Recheck in 3 weeks.   Medical screening examination/treatment/procedure(s) were performed by non-physician practitioner and as supervising physician I was immediately available for consultation/collaboration.  I agree with above. Jacinta Shoe, MD

## 2022-08-05 ENCOUNTER — Ambulatory Visit (INDEPENDENT_AMBULATORY_CARE_PROVIDER_SITE_OTHER): Payer: MEDICARE

## 2022-08-05 ENCOUNTER — Ambulatory Visit (INDEPENDENT_AMBULATORY_CARE_PROVIDER_SITE_OTHER): Payer: MEDICARE | Admitting: Internal Medicine

## 2022-08-05 ENCOUNTER — Encounter: Payer: Self-pay | Admitting: Internal Medicine

## 2022-08-05 VITALS — BP 110/62 | HR 65 | Temp 98.6°F | Ht 70.0 in | Wt 198.0 lb

## 2022-08-05 DIAGNOSIS — R1011 Right upper quadrant pain: Secondary | ICD-10-CM

## 2022-08-05 DIAGNOSIS — K8020 Calculus of gallbladder without cholecystitis without obstruction: Secondary | ICD-10-CM | POA: Diagnosis not present

## 2022-08-05 DIAGNOSIS — N281 Cyst of kidney, acquired: Secondary | ICD-10-CM

## 2022-08-05 DIAGNOSIS — C3491 Malignant neoplasm of unspecified part of right bronchus or lung: Secondary | ICD-10-CM

## 2022-08-05 DIAGNOSIS — F4321 Adjustment disorder with depressed mood: Secondary | ICD-10-CM

## 2022-08-05 DIAGNOSIS — K921 Melena: Secondary | ICD-10-CM | POA: Diagnosis not present

## 2022-08-05 DIAGNOSIS — Z7901 Long term (current) use of anticoagulants: Secondary | ICD-10-CM

## 2022-08-05 LAB — CBC WITH DIFFERENTIAL/PLATELET
Basophils Absolute: 0.1 10*3/uL (ref 0.0–0.1)
Basophils Relative: 0.7 % (ref 0.0–3.0)
Eosinophils Absolute: 0 10*3/uL (ref 0.0–0.7)
Eosinophils Relative: 0.4 % (ref 0.0–5.0)
HCT: 41.4 % (ref 39.0–52.0)
Hemoglobin: 13.4 g/dL (ref 13.0–17.0)
Lymphocytes Relative: 7.8 % — ABNORMAL LOW (ref 12.0–46.0)
Lymphs Abs: 0.9 10*3/uL (ref 0.7–4.0)
MCHC: 32.3 g/dL (ref 30.0–36.0)
MCV: 91.6 fl (ref 78.0–100.0)
Monocytes Absolute: 1.1 10*3/uL — ABNORMAL HIGH (ref 0.1–1.0)
Monocytes Relative: 9.7 % (ref 3.0–12.0)
Neutro Abs: 9.6 10*3/uL — ABNORMAL HIGH (ref 1.4–7.7)
Neutrophils Relative %: 81.4 % — ABNORMAL HIGH (ref 43.0–77.0)
Platelets: 123 10*3/uL — ABNORMAL LOW (ref 150.0–400.0)
RBC: 4.53 Mil/uL (ref 4.22–5.81)
RDW: 13.8 % (ref 11.5–15.5)
WBC: 11.7 10*3/uL — ABNORMAL HIGH (ref 4.0–10.5)

## 2022-08-05 LAB — URINALYSIS, ROUTINE W REFLEX MICROSCOPIC: RBC / HPF: NONE SEEN (ref 0–?)

## 2022-08-05 LAB — COMPREHENSIVE METABOLIC PANEL
ALT: 47 U/L (ref 0–53)
AST: 63 U/L — ABNORMAL HIGH (ref 0–37)
Albumin: 3.8 g/dL (ref 3.5–5.2)
Alkaline Phosphatase: 131 U/L — ABNORMAL HIGH (ref 39–117)
BUN: 26 mg/dL — ABNORMAL HIGH (ref 6–23)
CO2: 29 mEq/L (ref 19–32)
Calcium: 9.2 mg/dL (ref 8.4–10.5)
Chloride: 98 mEq/L (ref 96–112)
Creatinine, Ser: 1.08 mg/dL (ref 0.40–1.50)
GFR: 61.69 mL/min (ref >60.00)
Glucose, Bld: 134 mg/dL — ABNORMAL HIGH (ref 70–99)
Potassium: 4.2 mEq/L (ref 3.5–5.1)
Sodium: 135 mEq/L (ref 135–145)
Total Bilirubin: 1.2 mg/dL (ref 0.2–1.2)
Total Protein: 6.9 g/dL (ref 6.0–8.3)

## 2022-08-05 LAB — LIPASE: Lipase: 15 U/L (ref 11.0–59.0)

## 2022-08-05 MED ORDER — HYDROCODONE-ACETAMINOPHEN 5-325 MG PO TABS: 1.0000 | ORAL_TABLET | Freq: Four times a day (QID) | ORAL | 0 refills | Status: DC | PRN

## 2022-08-05 MED ORDER — AMOXICILLIN-POT CLAVULANATE 875-125 MG PO TABS: 1.0000 | ORAL_TABLET | Freq: Two times a day (BID) | ORAL | 0 refills | Status: DC

## 2022-08-05 MED ORDER — ONDANSETRON HCL 4 MG PO TABS: 4.0000 mg | ORAL_TABLET | Freq: Three times a day (TID) | ORAL | 0 refills | Status: DC | PRN

## 2022-08-05 NOTE — Progress Notes (Signed)
Subjective:  Patient ID: Bernard [name], male    DOB: [birth year]  Age: 89 y.o. MRN: [id number]  CC: Follow-up (6 [facility] F/U)   HPI Bernard [name] presents for blood in stool once on Apr 12, 2022 (Dr Laverle Patter felt hemorrhoids). No relapse. C/o GS on the remote scan - c/o RUQ pain in May 2024 and last night... CT chest 2017 - Possible tiny layering gallstones in the gallbladder. No gallbladder wall thickening. C/o wt loss 4-7 lbs, weakness  Outpatient Medications Prior to Visit  Medication Sig Dispense Refill   cholecalciferol (VITAMIN D) 1000 UNITS tablet Take 1,000 Units by mouth daily.     diazepam (VALIUM) 5 MG tablet Take 1 tablet (5 mg total) by mouth 2 (two) times daily as needed for anxiety. 180 tablet 1   finasteride (PROSCAR) 5 MG tablet Take 1 tablet (5 mg total) by mouth daily. 90 tablet 3   multivitamin-lutein (OCUVITE-LUTEIN) CAPS Take 1 capsule by mouth daily.     nitroGLYCERIN (NITROSTAT) 0.4 MG SL tablet Place 1 tablet (0.4 mg total) under the tongue every 5 (five) minutes as needed for chest pain (3 DOSES MAX). 25 tablet 2   simvastatin (ZOCOR) 20 MG tablet Take 1 tablet (20 mg total) by mouth daily. 90 tablet 3   terazosin (HYTRIN) 5 MG capsule Take 1 capsule (5 mg total) by mouth daily. 90 capsule 3   warfarin (COUMADIN) 5 MG tablet Take 1 tablet daily or Take as directed by anticoagulation clinic. 90 tablet 3   No facility-administered medications prior to visit.    ROS: Review of Systems  Constitutional:  Positive for fatigue and unexpected weight change. Negative for appetite change.  HENT:  Negative for congestion, nosebleeds, sneezing, sore throat and trouble swallowing.   Eyes:  Negative for itching and visual disturbance.  Respiratory:  Negative for cough.   Cardiovascular:  Negative for chest pain, palpitations and leg swelling.  Gastrointestinal:  Positive for abdominal pain. Negative for abdominal distention, blood in stool, diarrhea, nausea and vomiting.   Genitourinary:  Negative for frequency and hematuria.  Musculoskeletal:  Negative for back pain, gait problem, joint swelling and neck pain.  Skin:  Negative for rash.  Neurological:  Negative for dizziness, tremors, speech difficulty and weakness.  Psychiatric/Behavioral:  Negative for agitation, dysphoric mood and sleep disturbance. The patient is not nervous/anxious.     Objective:  BP 110/62 (BP Location: Left Arm, Patient Position: Sitting, Cuff Size: Normal)   Pulse 65   Temp 98.6 F (37 C) (Oral)   Ht 5\' 10"  (1.778 m)   Wt 198 lb (89.8 kg)   SpO2 95%   BMI 28.41 kg/m   BP Readings from Last 3 Encounters:  08/05/22 110/62  02/11/22 122/76  02/05/22 118/70    Wt Readings from Last 3 Encounters:  08/05/22 198 lb (89.8 kg)  02/11/22 205 lb 12.8 oz (93.4 kg)  02/05/22 205 lb (93 kg)    Physical Exam Constitutional:      General: He is not in acute distress.    Appearance: He is well-developed. He is obese.     Comments: NAD  Eyes:     Conjunctiva/sclera: Conjunctivae normal.     Pupils: Pupils are equal, round, and reactive to light.  Neck:     Thyroid: No thyromegaly.     Vascular: No JVD.  Cardiovascular:     Rate and Rhythm: Normal rate and regular rhythm.     Heart sounds: Normal heart sounds. No  murmur heard.    No friction rub. No gallop.  Pulmonary:     Effort: Pulmonary effort is normal. No respiratory distress.     Breath sounds: Normal breath sounds. No wheezing or rales.  Chest:     Chest wall: No tenderness.  Abdominal:     General: Bowel sounds are normal. There is no distension.     Palpations: Abdomen is soft. There is no mass.     Tenderness: There is no abdominal tenderness. There is no guarding or rebound.  Musculoskeletal:        General: No tenderness. Normal range of motion.     Cervical back: Normal range of motion.  Lymphadenopathy:     Cervical: No cervical adenopathy.  Skin:    General: Skin is warm and dry.     Findings: No  rash.  Neurological:     Mental Status: He is alert and oriented to person, place, and time.     Cranial Nerves: No cranial nerve deficit.     Motor: No abnormal muscle tone.     Coordination: Coordination normal.     Gait: Gait normal.     Deep Tendon Reflexes: Reflexes are normal and symmetric.  Psychiatric:        Behavior: Behavior normal.        Thought Content: Thought content normal.        Judgment: Judgment normal.   RUQ is painful - no rebound  Lab Results  Component Value Date   WBC 7.0 02/05/2022   HGB 14.0 02/05/2022   HCT 42.3 02/05/2022   PLT 146.0 (L) 02/05/2022   GLUCOSE 103 (H) 02/05/2022   CHOL 114 08/06/2021   TRIG 70.0 08/06/2021   HDL 39.70 08/06/2021   LDLCALC 60 08/06/2021   ALT 17 02/05/2022   AST 22 02/05/2022   NA 141 02/05/2022   K 4.5 02/05/2022   CL 104 02/05/2022   CREATININE 0.98 02/05/2022   BUN 14 02/05/2022   CO2 32 02/05/2022   TSH 2.01 02/05/2022   PSA 0.37 08/05/2020   INR 3.8 (A) 07/28/2022   HGBA1C (H) 10/23/2009    6.0 (NOTE)                                                                       According to the ADA Clinical Practice Recommendations for 2011, when HbA1c is used as a screening test:   >=6.5%   Diagnostic of Diabetes Mellitus           (if abnormal result  is confirmed)  5.7-6.4%   Increased risk of developing Diabetes Mellitus  References:Diagnosis and Classification of Diabetes Mellitus,Diabetes Care,2011,34(Suppl 1):S62-S69 and Standards of Medical Care in         Diabetes - 2011,Diabetes Care,2011,34  (Suppl 1):S11-S61.    DG Chest 2 View  Result Date: 08/09/2018 CLINICAL DATA:  No chest complaints. EXAM: CHEST - 2 VIEW COMPARISON:  Chest radiograph 09/13/2017 FINDINGS: Stable enlarged cardiac and mediastinal contours. Similar-appearing bibasilar linear opacities, likely representing scarring. Elevation right hemidiaphragm. Thoracic spine degenerative changes. No pleural effusion or pneumothorax. IMPRESSION: No  acute cardiopulmonary process. Electronically Signed   By: Annia Belt M.D.   On: 08/09/2018 17:23    Assessment &  Plan:   Problem List Items Addressed This Visit     Carcinoma of right lung (HCC) - Primary    Repeat CXR      Relevant Medications   amoxicillin-clavulanate (AUGMENTIN) 875-125 MG tablet   ondansetron (ZOFRAN) 4 MG tablet   Other Relevant Orders   DG Chest 2 View   RUQ abdominal pain    Probably symptomatic GS now Check CBC, LFTs, lipase PO abx Abd [place] Augmentin, Zofran, Norco Surgical ref if needed To ER if worse and NPO      Relevant Orders   US Abdomen Complete   CBC with Differential/Platelet   Comprehensive metabolic panel   Urinalysis   Lipase   Cyst of right kidney    Check [place]      Relevant Orders   US Abdomen Complete   CBC with Differential/Platelet   Comprehensive metabolic panel   Urinalysis   Lipase   Gallstones    Probably symptomatic now Check CBC, LFTs, lipase PO abx Abd [place] Surgical ref if needed      Relevant Orders   US Abdomen Complete   Long term (current) use of anticoagulants    On Coumadin      Grief    Wife [name] died in August 29, 2021  [place]      Hematochezia    x1 -  blood in stool once on Apr 12, 2022 (Dr Laverle Patter felt hemorrhoids). No relapse. Check CBC      Relevant Orders   CBC with Differential/Platelet   Comprehensive metabolic panel   Urinalysis   Lipase      Meds ordered this encounter  Medications   amoxicillin-clavulanate (AUGMENTIN) 875-125 MG tablet    Sig: Take 1 tablet by mouth 2 (two) times daily.    Dispense:  20 tablet    Refill:  0   ondansetron (ZOFRAN) 4 MG tablet    Sig: Take 1 tablet (4 mg total) by mouth every 8 (eight) hours as needed for nausea or vomiting.    Dispense:  20 tablet    Refill:  0   HYDROcodone-acetaminophen (NORCO/VICODIN) 5-325 MG tablet    Sig: Take 1 tablet by mouth every 6 (six) hours as needed for severe pain.    Dispense:  20 tablet    Refill:  0       Follow-up: Return in about 2 weeks (around 08/19/2022) for a follow-up visit.  Sonda Primes, MD

## 2022-08-05 NOTE — Assessment & Plan Note (Signed)
Check US 

## 2022-08-05 NOTE — Assessment & Plan Note (Addendum)
Probably symptomatic GS now Check CBC, LFTs, lipase PO abx Abd [place] Augmentin, Zofran, Norco Surgical ref if needed To ER if worse and NPO

## 2022-08-05 NOTE — Assessment & Plan Note (Signed)
x1 -  blood in stool once on Apr 12, 2022 (Dr Laverle Patter felt hemorrhoids). No relapse. Check CBC

## 2022-08-05 NOTE — Assessment & Plan Note (Signed)
Probably symptomatic now Check CBC, LFTs, lipase PO abx Abd [place] Surgical ref if needed

## 2022-08-05 NOTE — Assessment & Plan Note (Signed)
Wife Bernard [name] died in 2023  [place]oping [place]

## 2022-08-05 NOTE — Assessment & Plan Note (Signed)
On Coumadin 

## 2022-08-05 NOTE — Assessment & Plan Note (Signed)
Repeat CXR

## 2022-08-07 ENCOUNTER — Emergency Department (HOSPITAL_COMMUNITY): Payer: MEDICARE

## 2022-08-07 ENCOUNTER — Other Ambulatory Visit: Payer: Self-pay

## 2022-08-07 ENCOUNTER — Inpatient Hospital Stay (HOSPITAL_COMMUNITY)
Admission: EM | Admit: 2022-08-07 | Discharge: 2022-08-09 | DRG: 442 | Disposition: A | Discharge status: Home / Self Care | Payer: MEDICARE | Attending: Family Medicine | Admitting: Family Medicine

## 2022-08-07 ENCOUNTER — Encounter (HOSPITAL_COMMUNITY): Payer: Self-pay

## 2022-08-07 DIAGNOSIS — I81 Portal vein thrombosis: Principal | ICD-10-CM | POA: Insufficient documentation

## 2022-08-07 DIAGNOSIS — Z9842 Cataract extraction status, left eye: Secondary | ICD-10-CM | POA: Diagnosis not present

## 2022-08-07 DIAGNOSIS — I10 Essential (primary) hypertension: Secondary | ICD-10-CM | POA: Diagnosis present

## 2022-08-07 DIAGNOSIS — K828 Other specified diseases of gallbladder: Principal | ICD-10-CM

## 2022-08-07 DIAGNOSIS — R7401 Elevation of levels of liver transaminase levels: Secondary | ICD-10-CM | POA: Diagnosis present

## 2022-08-07 DIAGNOSIS — D6859 Other primary thrombophilia: Secondary | ICD-10-CM | POA: Diagnosis present

## 2022-08-07 DIAGNOSIS — Z79899 Other long term (current) drug therapy: Secondary | ICD-10-CM

## 2022-08-07 DIAGNOSIS — K5900 Constipation, unspecified: Secondary | ICD-10-CM | POA: Diagnosis present

## 2022-08-07 DIAGNOSIS — I482 Chronic atrial fibrillation, unspecified: Secondary | ICD-10-CM | POA: Diagnosis present

## 2022-08-07 DIAGNOSIS — Z8673 Personal history of transient ischemic attack (TIA), and cerebral infarction without residual deficits: Secondary | ICD-10-CM | POA: Diagnosis not present

## 2022-08-07 DIAGNOSIS — I252 Old myocardial infarction: Secondary | ICD-10-CM

## 2022-08-07 DIAGNOSIS — I480 Paroxysmal atrial fibrillation: Secondary | ICD-10-CM | POA: Diagnosis present

## 2022-08-07 DIAGNOSIS — Z7901 Long term (current) use of anticoagulants: Secondary | ICD-10-CM

## 2022-08-07 DIAGNOSIS — I4892 Unspecified atrial flutter: Secondary | ICD-10-CM | POA: Diagnosis present

## 2022-08-07 DIAGNOSIS — R109 Unspecified abdominal pain: Secondary | ICD-10-CM | POA: Diagnosis not present

## 2022-08-07 DIAGNOSIS — R160 Hepatomegaly, not elsewhere classified: Secondary | ICD-10-CM | POA: Insufficient documentation

## 2022-08-07 DIAGNOSIS — C787 Secondary malignant neoplasm of liver and intrahepatic bile duct: Secondary | ICD-10-CM | POA: Diagnosis present

## 2022-08-07 DIAGNOSIS — E785 Hyperlipidemia, unspecified: Secondary | ICD-10-CM | POA: Diagnosis present

## 2022-08-07 DIAGNOSIS — I2510 Atherosclerotic heart disease of native coronary artery without angina pectoris: Secondary | ICD-10-CM | POA: Diagnosis present

## 2022-08-07 DIAGNOSIS — Z9841 Cataract extraction status, right eye: Secondary | ICD-10-CM

## 2022-08-07 DIAGNOSIS — N400 Enlarged prostate without lower urinary tract symptoms: Secondary | ICD-10-CM | POA: Diagnosis present

## 2022-08-07 DIAGNOSIS — Z96653 Presence of artificial knee joint, bilateral: Secondary | ICD-10-CM | POA: Diagnosis present

## 2022-08-07 DIAGNOSIS — Z85118 Personal history of other malignant neoplasm of bronchus and lung: Secondary | ICD-10-CM

## 2022-08-07 DIAGNOSIS — Z66 Do not resuscitate: Secondary | ICD-10-CM | POA: Diagnosis present

## 2022-08-07 DIAGNOSIS — K219 Gastro-esophageal reflux disease without esophagitis: Secondary | ICD-10-CM | POA: Diagnosis present

## 2022-08-07 LAB — CBC WITH DIFFERENTIAL/PLATELET
Abs Immature Granulocytes: 0.06 10*3/uL (ref 0.00–0.07)
Basophils Absolute: 0 10*3/uL (ref 0.0–0.1)
Basophils Relative: 0 %
Eosinophils Absolute: 0.1 10*3/uL (ref 0.0–0.5)
Eosinophils Relative: 1 %
HCT: 38.7 % — ABNORMAL LOW (ref 39.0–52.0)
Hemoglobin: 12.5 g/dL — ABNORMAL LOW (ref 13.0–17.0)
Immature Granulocytes: 1 %
Lymphocytes Relative: 8 %
Lymphs Abs: 0.9 10*3/uL (ref 0.7–4.0)
MCH: 30.3 pg (ref 26.0–34.0)
MCHC: 32.3 g/dL (ref 30.0–36.0)
MCV: 93.7 fL (ref 80.0–100.0)
Monocytes Absolute: 1 10*3/uL (ref 0.1–1.0)
Monocytes Relative: 9 %
Neutro Abs: 8.5 10*3/uL — ABNORMAL HIGH (ref 1.7–7.7)
Neutrophils Relative %: 81 %
Platelets: 102 10*3/uL — ABNORMAL LOW (ref 150–400)
RBC: 4.13 MIL/uL — ABNORMAL LOW (ref 4.22–5.81)
RDW: 13.2 % (ref 11.5–15.5)
WBC: 10.5 10*3/uL (ref 4.0–10.5)
nRBC: 0 % (ref 0.0–0.2)

## 2022-08-07 LAB — URINALYSIS, ROUTINE W REFLEX MICROSCOPIC
Bilirubin Urine: NEGATIVE
Glucose, UA: NEGATIVE mg/dL
Hgb urine dipstick: NEGATIVE
Ketones, ur: NEGATIVE mg/dL
Leukocytes,Ua: NEGATIVE
Nitrite: NEGATIVE
Protein, ur: NEGATIVE mg/dL
Specific Gravity, Urine: >1.046 — ABNORMAL HIGH (ref 1.005–1.030)
pH: 6 (ref 5.0–8.0)

## 2022-08-07 LAB — COMPREHENSIVE METABOLIC PANEL
ALT: 53 U/L — ABNORMAL HIGH (ref 0–44)
AST: 82 U/L — ABNORMAL HIGH (ref 15–41)
Albumin: 2.9 g/dL — ABNORMAL LOW (ref 3.5–5.0)
Alkaline Phosphatase: 129 U/L — ABNORMAL HIGH (ref 38–126)
Anion gap: 10 (ref 5–15)
BUN: 30 mg/dL — ABNORMAL HIGH (ref 8–23)
CO2: 24 mmol/L (ref 22–32)
Calcium: 8.4 mg/dL — ABNORMAL LOW (ref 8.9–10.3)
Chloride: 101 mmol/L (ref 98–111)
Creatinine, Ser: 1.05 mg/dL (ref 0.61–1.24)
GFR, Estimated: >60 mL/min (ref >60)
Glucose, Bld: 161 mg/dL — ABNORMAL HIGH (ref 70–99)
Potassium: 3.9 mmol/L (ref 3.5–5.1)
Sodium: 135 mmol/L (ref 135–145)
Total Bilirubin: 0.9 mg/dL (ref 0.3–1.2)
Total Protein: 5.9 g/dL — ABNORMAL LOW (ref 6.5–8.1)

## 2022-08-07 LAB — LIPASE, BLOOD: Lipase: 26 U/L (ref 11–51)

## 2022-08-07 LAB — TROPONIN I (HIGH SENSITIVITY): Troponin I (High Sensitivity): 12 ng/L (ref <18)

## 2022-08-07 LAB — PROTIME-INR
INR: 3 — ABNORMAL HIGH (ref 0.8–1.2)
Prothrombin Time: 31.6 seconds — ABNORMAL HIGH (ref 11.4–15.2)

## 2022-08-07 LAB — BRAIN NATRIURETIC PEPTIDE: B Natriuretic Peptide: 170.8 pg/mL — ABNORMAL HIGH (ref 0.0–100.0)

## 2022-08-07 MED ORDER — HYDROMORPHONE HCL 1 MG/ML IJ SOLN: 0.5000 mg | INTRAMUSCULAR | Status: DC | PRN

## 2022-08-07 MED ORDER — SIMVASTATIN 20 MG PO TABS
20.0000 mg | ORAL_TABLET | Freq: Every day | ORAL | Status: DC
  Administered 2022-08-07 – 2022-08-09 (×3): 20 mg via ORAL
  Filled 2022-08-07 (×3): qty 1

## 2022-08-07 MED ORDER — FINASTERIDE 5 MG PO TABS
5.0000 mg | ORAL_TABLET | Freq: Every day | ORAL | Status: DC
  Administered 2022-08-07 – 2022-08-09 (×3): 5 mg via ORAL
  Filled 2022-08-07 (×3): qty 1

## 2022-08-07 MED ORDER — ONDANSETRON HCL 4 MG/2ML IJ SOLN: 4.0000 mg | Freq: Four times a day (QID) | INTRAMUSCULAR | Status: DC | PRN

## 2022-08-07 MED ORDER — HYDROCODONE-ACETAMINOPHEN 5-325 MG PO TABS
1.0000 | ORAL_TABLET | Freq: Four times a day (QID) | ORAL | Status: DC | PRN
  Administered 2022-08-07 – 2022-08-09 (×4): 1 via ORAL
  Filled 2022-08-07 (×4): qty 1

## 2022-08-07 MED ORDER — IOHEXOL 350 MG/ML SOLN
75.0000 mL | Freq: Once | INTRAVENOUS | Status: AC | PRN
  Administered 2022-08-07: 75 mL via INTRAVENOUS

## 2022-08-07 MED ORDER — TERAZOSIN HCL 5 MG PO CAPS
5.0000 mg | ORAL_CAPSULE | ORAL | Status: DC
  Administered 2022-08-07 – 2022-08-09 (×2): 5 mg via ORAL
  Filled 2022-08-07 (×3): qty 1

## 2022-08-07 NOTE — ED Provider Notes (Signed)
Medical Decision Making: Care of patient assumed from  Jeannie Fend, PA-C   at 3:05 PM.  Agree with history.  See their note for further details.  Briefly, 89 y.o. male with PMH/PSH as below.  Past Medical History:  Diagnosis Date   CAD (coronary artery disease)    s/p MI 1976, normal myoview 7/11   Depressive disorder, not elsewhere classified    Esophageal reflux    Headache    migraines occ., none in months.   History of BPH    benign prostatic hypertrophy   Labyrinthitis, unspecified    Malignant neoplasm middle lobe, bronchus or lung    right lung. Dx 07/27/08, sp/p resection 7/10.    Osteoarthrosis, unspecified whether generalized or localized, lower leg    knee   Other and unspecified hyperlipidemia    Paroxysmal atrial fibrillation (HCC)    postoperative after lung surgery   Renal cyst, right    being followed   Stroke [facility])    TIA March 2000     no side effects now   Unspecified essential hypertension    Past Surgical History:  Procedure Laterality Date   anthroscopic surgery  '10   right knee \   BUNIONECTOMY  '89   left foot   CATARACT EXTRACTION, BILATERAL     ESOPHAGOGASTRODUODENOSCOPY  05/14/04   EYE SURGERY     both eyes - 2011 ([facility])   INGUINAL HERNIA REPAIR  '71   left   right TKR  8/11   TOTAL KNEE ARTHROPLASTY Left 09/24/2014   Procedure: LEFT TOTAL KNEE ARTHROPLASTY;  Surgeon: Ollen Gross, MD;  Location: [facility];  Service: Orthopedics;  Laterality: Left;   VAT-RML lobectomy        Patient presents with exertional abdominal pain.  ACS workup is negative.  Also complaining of fatigue with primarily right upper quadrant abdominal pain.  Overall benign exam with minimal pain.  History of gallbladder sludge in 2017 therefore came in for evaluation.  Currently pending CT scan.  Current plan is as follows: Follow-up CT scan and reassess  MDM:  -Reviewed and confirmed nursing documentation for past medical history, family history, social history. -Vital  signs stable.*** -Upon reevaluation, patient ***.   -Patient had no acute events while under my care in the emergency department.     The plan for this patient was discussed with Dr. Silverio Lay, who voiced agreement and who oversaw evaluation and treatment of this patient.  Marta Lamas, MD Emergency Medicine, PGY-3  Note: Dragon medical dictation software was used in the creation of this note.   @DISPOSITION @

## 2022-08-07 NOTE — H&P (Signed)
History and Physical    Bernard [name] WUJ:[id number] DOB: October 04, 1932 DOA: 08/07/2022  PCP: Tresa Garter, MD (Confirm with patient/family/NH records and if not entered, this has to be entered at [facility] point of entry) Patient coming from: Home  I have personally briefly reviewed patient's old medical records in [facility]  Chief Complaint: Belly hurts  HPI: Bernard [name] is a 89 y.o. male with medical history significant of most history of RML lung CA status post lobectomy, HTN, chronic A-fib/a flutter on Coumadin, BPH, presented with worsening of RUQ abdominal pain.  Symptoms started 2 to 3 weeks ago with intermittent right upper quadrant abdominal pain, night> daytime, not associated with food intake, lost about 5 pounds since January and last week patient became malaise and fatigue, intermittent activity can trigger significant feeling of tired.  No fever chills no nauseous vomiting no diarrhea.  Went to see PCP yesterday, who ordered a abdominal x-ray showed no acute findings and scheduled ultrasound next week.  PCP suspected cholecystitis and started patient on Augmentin.  However overnight patient abdominal pain became intolerable and decided to come to the hospital.  ED Course: Afebrile, none tachycardia nonhypotensive lung hypoxia.  Blood work showed elevated LFT AST 82 ALT 53 total bilirubin 0.9 albumin 2.9 BUN 30 creatinine 1.0 WBC 10.5.  CT abdominal pelvis showed irregular masslike wall thickening in the gallbladder lumen and large mass in the inferior right hepatic lobe with associated portal vein thrombosis suspicious for metastatic gallbladder adenocarcinoma or hepatocellular carcinoma  Review of Systems: As per HPI otherwise 14 point review of systems negative.    Past Medical History:  Diagnosis Date   CAD (coronary artery disease)    s/p MI 1976, normal myoview 7/11   Depressive disorder, not elsewhere classified    Esophageal reflux    Headache     migraines occ., none in months.   History of BPH    benign prostatic hypertrophy   Labyrinthitis, unspecified    Malignant neoplasm middle lobe, bronchus or lung    right lung. Dx 07/27/08, sp/p resection 7/10.    Osteoarthrosis, unspecified whether generalized or localized, lower leg    knee   Other and unspecified hyperlipidemia    Paroxysmal atrial fibrillation (HCC)    postoperative after lung surgery   Renal cyst, right    being followed   Stroke [facility])    TIA March 2000     no side effects now   Unspecified essential hypertension     Past Surgical History:  Procedure Laterality Date   anthroscopic surgery  '10   right knee \   BUNIONECTOMY  '89   left foot   CATARACT EXTRACTION, BILATERAL     ESOPHAGOGASTRODUODENOSCOPY  05/14/04   EYE SURGERY     both eyes - 2011 ([place])   INGUINAL HERNIA REPAIR  '71   left   right TKR  8/11   TOTAL KNEE ARTHROPLASTY Left 09/24/2014   Procedure: LEFT TOTAL KNEE ARTHROPLASTY;  Surgeon: Ollen Gross, MD;  Location: [facility];  Service: Orthopedics;  Laterality: Left;   VAT-RML lobectomy       reports that he has never smoked. He has never used smokeless tobacco. He reports that he does not drink alcohol and does not use drugs.  No Known Allergies  Family History  Problem Relation Age of Onset   Diabetes insipidus Unknown        2nd kin    Coronary artery disease Neg Hx  Colon cancer Neg Hx    Prostate cancer Neg Hx    Heart attack Neg Hx      Prior to Admission medications   Medication Sig Start Date End Date Taking? Authorizing Provider  amoxicillin-clavulanate (AUGMENTIN) 875-125 MG tablet Take 1 tablet by mouth 2 (two) times daily. 08/05/22  Yes Plotnikov, Georgina Quint, MD  cholecalciferol (VITAMIN D) 1000 UNITS tablet Take 1,000 Units by mouth daily. Take 1 tablet at 6 pm.   Yes [provider]  finasteride (PROSCAR) 5 MG tablet Take 1 tablet (5 mg total) by mouth daily. Patient taking differently: Take 5 mg by mouth  daily. Take 1 tablet at 6 pm 02/05/22  Yes Plotnikov, Georgina Quint, MD  HYDROcodone-acetaminophen (NORCO/VICODIN) 5-325 MG tablet Take 1 tablet by mouth every 6 (six) hours as needed for severe pain. 08/05/22 08/05/23 Yes Plotnikov, Georgina Quint, MD  multivitamin-lutein [facility]) CAPS Take 1 capsule by mouth daily.   Yes [provider]  Polyvinyl Alcohol-Povidone (REFRESH OP) Apply 2 drops to eye 2 (two) times daily.   Yes [provider]  simvastatin (ZOCOR) 20 MG tablet Take 1 tablet (20 mg total) by mouth daily. Patient taking differently: Take 20 mg by mouth daily. Take at 6pm 02/05/22  Yes Plotnikov, Georgina Quint, MD  terazosin (HYTRIN) 5 MG capsule Take 1 capsule (5 mg total) by mouth daily. Patient taking differently: Take 5 mg by mouth every other day. Take every other night at 6 pm. 02/05/22  Yes Plotnikov, Georgina Quint, MD  warfarin (COUMADIN) 5 MG tablet Take 1 tablet daily or Take as directed by anticoagulation clinic. Patient taking differently: Take 2.5 mg by mouth daily at 6 PM. Take 1 tablet by mouth on Monday at 6 pm, and one-half pill on other days at 6pm *Subject to change* 02/05/22  Yes Plotnikov, Georgina Quint, MD  White Petrolatum-Mineral Oil (GENTEAL TEARS NIGHT-TIME OP) Apply 1 Dose to eye at bedtime.   Yes [provider]  ondansetron (ZOFRAN) 4 MG tablet Take 1 tablet (4 mg total) by mouth every 8 (eight) hours as needed for nausea or vomiting. 08/05/22   Plotnikov, Georgina Quint, MD    Physical Exam: Vitals:   08/07/22 1645 08/07/22 1804 08/07/22 1845 08/07/22 1915  BP: 133/89  118/67 110/74  Pulse: 86  92 82  Resp: 16  19 19   Temp:      TempSrc:      SpO2: 95%  95% 96%  Weight:  89.8 kg    Height:  5\' 10"  (1.778 m)      Constitutional: NAD, calm, comfortable Vitals:   08/07/22 1645 08/07/22 1804 08/07/22 1845 08/07/22 1915  BP: 133/89  118/67 110/74  Pulse: 86  92 82  Resp: 16  19 19   Temp:      TempSrc:      SpO2: 95%  95% 96%  Weight:   89.8 kg    Height:  5\' 10"  (1.778 m)     Eyes: PERRL, lids and conjunctivae normal ENMT: Mucous membranes are moist. Posterior pharynx clear of any exudate or lesions.Normal dentition.  Neck: normal, supple, no masses, no thyromegaly Respiratory: clear to auscultation bilaterally, no wheezing, no crackles. Normal respiratory effort. No accessory muscle use.  Cardiovascular: Regular rate and rhythm, no murmurs / rubs / gallops. No extremity edema. 2+ pedal pulses. No carotid bruits.  Abdomen: RUQ tenderness no rebound no guarding, no masses palpated. No hepatosplenomegaly. Bowel sounds positive.  Musculoskeletal: no clubbing / cyanosis. No joint deformity  upper and lower extremities. Good ROM, no contractures. Normal muscle tone.  Skin: no rashes, lesions, ulcers. No induration Neurologic: CN 2-12 grossly intact. Sensation intact, DTR normal. Strength 5/5 in all 4.  Psychiatric: Normal judgment and insight. Alert and oriented x 3. Normal mood.     Labs on Admission: I have personally reviewed following labs and imaging studies  CBC: Recent Labs  Lab 08/05/22 1226 08/07/22 1245  WBC 11.7* 10.5  NEUTROABS 9.6* 8.5*  HGB 13.4 12.5*  HCT 41.4 38.7*  MCV 91.6 93.7  PLT 123.0* 102*   Basic Metabolic Panel: Recent Labs  Lab 08/05/22 1226 08/07/22 1245  NA 135 135  K 4.2 3.9  CL 98 101  CO2 29 24  GLUCOSE 134* 161*  BUN 26* 30*  CREATININE 1.08 1.05  CALCIUM 9.2 8.4*   GFR: Estimated Creatinine Clearance: 55.9 mL/min (by C-G formula based on SCr of 1.05 mg/dL). Liver Function Tests: Recent Labs  Lab 08/05/22 1226 08/07/22 1245  AST 63* 82*  ALT 47 53*  ALKPHOS 131* 129*  BILITOT 1.2 0.9  PROT 6.9 5.9*  ALBUMIN 3.8 2.9*   Recent Labs  Lab 08/05/22 1226 08/07/22 1245  LIPASE 15.0 26   No results for input(s): "AMMONIA" in the last 168 hours. Coagulation Profile: Recent Labs  Lab 08/07/22 1245  INR 3.0*   Cardiac Enzymes: No results for input(s): "CKTOTAL",  "CKMB", "CKMBINDEX", "TROPONINI" in the last 168 hours. BNP (last 3 results) No results for input(s): "PROBNP" in the last 8760 hours. HbA1C: No results for input(s): "HGBA1C" in the last 72 hours. CBG: No results for input(s): "GLUCAP" in the last 168 hours. Lipid Profile: No results for input(s): "CHOL", "HDL", "LDLCALC", "TRIG", "CHOLHDL", "LDLDIRECT" in the last 72 hours. Thyroid Function Tests: No results for input(s): "TSH", "T4TOTAL", "FREET4", "T3FREE", "THYROIDAB" in the last 72 hours. Anemia Panel: No results for input(s): "VITAMINB12", "FOLATE", "FERRITIN", "TIBC", "IRON", "RETICCTPCT" in the last 72 hours. Urine analysis:    Component Value Date/Time   COLORURINE YELLOW 08/07/2022 1628   APPEARANCEUR CLEAR 08/07/2022 1628   LABSPEC >1.046 (H) 08/07/2022 1628   PHURINE 6.0 08/07/2022 1628   GLUCOSEU NEGATIVE 08/07/2022 1628   GLUCOSEU Color Interference (A) 08/05/2022 1226   HGBUR NEGATIVE 08/07/2022 1628   BILIRUBINUR NEGATIVE 08/07/2022 1628   KETONESUR NEGATIVE 08/07/2022 1628   PROTEINUR NEGATIVE 08/07/2022 1628   UROBILINOGEN Color Interference (A) 08/05/2022 1226   NITRITE NEGATIVE 08/07/2022 1628   LEUKOCYTESUR NEGATIVE 08/07/2022 1628    Radiological Exams on Admission: CT ABDOMEN PELVIS W CONTRAST  Result Date: 08/07/2022 CLINICAL DATA:  Right upper quadrant abdominal pain * Tracking Code: BO * EXAM: CT ABDOMEN AND PELVIS WITH CONTRAST TECHNIQUE: Multidetector CT imaging of the abdomen and pelvis was performed using the standard protocol following bolus administration of intravenous contrast. RADIATION DOSE REDUCTION: This exam was performed according to the departmental dose-optimization program which includes automated exposure control, adjustment of the mA and/or kV according to patient size and/or use of iterative reconstruction technique. CONTRAST:  75mL OMNIPAQUE IOHEXOL 350 MG/ML SOLN COMPARISON:  04/01/2012 FINDINGS: Lower chest: Mild cardiomegaly.  Right  infrahilar clips are noted. New small pulmonary nodules are present in both lung bases. In the right lower lobe an index nodule measures 1.0 by 0.7 cm on image 19 series 4. In the lingula a nodule measures 6 by 5 mm on image 3 series 4. Multiple additional nodules are present. Hepatobiliary: Suspected portal vein thrombosis anteriorly in the right hepatic lobe (segments  5 and 8) associated with a heterogeneous 14.4 by 9.6 cm mass inferiorly in the right hepatic lobe. Some of this measurement may reflect hypoenhancing but non malignant hepatic parenchymal tissue related to the portal vein thrombosis, but most of this demonstrates heterogeneous enhancement highly suspicious for malignancy. There is thickening and irregularity within the gallbladder suspicious for a mass inside the gallbladder lumen, image 55 series 6. Gallbladder adenocarcinoma is not excluded. Hepatic lesion could alternatively be related to hepatocellular carcinoma given the portal vein thrombosis. Speckled hypodensities are observed in segment 8 of the liver and may be related to portal vein thrombosis or additional small tumors. Pancreas: Unremarkable Spleen: Speckled calcifications along the lateral margin of the spleen. Otherwise unremarkable. Adrenals/Urinary Tract: Both adrenal glands appear normal. Benign bilateral renal cysts are observed. No further imaging workup of these lesions is indicated. 2 mm right mid kidney nonobstructive renal calculus. Two right renal arteries, the more caudad of which extends from the vicinity of the bifurcation to the lower pole. There is scarring of the left kidney lower pole. Stomach/Bowel: Sigmoid colon diverticulosis. Vascular/Lymphatic: Atherosclerosis is present, including aortoiliac atherosclerotic disease. Heart and soft plaque causing stenosis of the proximal SMA, without overt occlusion identified. Peripancreatic/porta hepatis lymph node enlarged at 1.3 cm in short axis on image 26 series 3. Other  porta hepatis lymph nodes are upper normal in size. Reproductive: Prominent prostatomegaly with the prostate gland measuring 6.2 by 6.3 by 7.5 cm (volume = 150 cm^3). Other: Trace ascites in the right paracolic gutter. Musculoskeletal: Probable hemangioma eccentric to the left in the L2 vertebral body. IMPRESSION: 1. 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 2. Large mass in the inferior right hepatic lobe with associated portal vein thrombosis in segments 5 and 8. This is highly suspicious for malignancy, possibly from metastatic gallbladder adenocarcinoma or hepatocellular carcinoma. 3. New multiple small pulmonary nodules in the lung bases, concerning for metastatic disease. 4. Mildly enlarged peripancreatic/porta hepatis lymph node. 5. Trace ascites in the right paracolic gutter. 6. Prominent prostatomegaly. 7. Aortic atherosclerosis. Aortic Atherosclerosis (ICD10-I70.0). Electronically Signed   By: Gaylyn Rong M.D.   On: 08/07/2022 15:51   DG Chest Port 1 View  Result Date: 08/07/2022 CLINICAL DATA:  Fatigue EXAM: PORTABLE CHEST 1 VIEW COMPARISON:  X-ray 08/05/2018 and older FINDINGS: Underinflation. Film is slightly rotated to the left. There is some linear opacity at the bases likely atelectasis. New from prior. No pneumothorax, effusion or edema. Overlapping cardiac leads. Stable cardiopericardial silhouette when adjusting for level of inflation. Surgical changes overlie the lower right chest. IMPRESSION: Underinflation with new presumed basilar atelectasis. Rotated radiograph Electronically Signed   By: Karen Kays M.D.   On: 08/07/2022 13:23    EKG: Independently reviewed.  A flutter with 4-1 transduction no acute ST changes.  Assessment/Plan Principal Problem:   Portal vein thrombosis Active Problems:   Liver mass  (please populate well all problems here in Problem List. (For example, if patient is  on BP meds at home and you resume or decide to hold them, it is a problem that needs to be her. Same for CAD, COPD, HLD and so on)  Acute portal vein thrombosis -Likely secondary to hypercoagulable state associated with hepatic and/or gallbladder mass -With Coumadin therapy failure -Secure chat with on-call oncologist Dr. Myna Hidalgo and waiting for reply.  For now start patient on heparin drip in case patient will need biopsy.  Hold off Coumadin.  Discussed with patient and granddaughter at bedside, patient is DNR, however patient wish to get gallbladder cancer/liver cancer treated if possible.  Discussed with on-call general surgeon Dr. Mitzi Davenport, who will see the patient tonight -Dilaudid for pain control -All his symptoms can be explained by bladder and liver mass as well as acute portal vein thrombosis, active cholecystitis likely, will discontinue Augmentin ordered by PCP.  Acute transaminitis -Probably secondary to the liver mass, management as above.  History of A-fib -In rate controlled A-flutter, switch Coumadin to heparin drip  HTN -Controlled, continue home BP meds  DVT prophylaxis: Heparin drip Code Status: DNR Family Communication: Granddaughter at bedside Disposition Plan: Sick with acute Podoben thrombosis with Coumadin therapy failure, requiring IV anticoagulation and inpatient surgical consultation, expect more than 2 midnight hospital stay. Consults called: Development worker, international aid, oncology paged Admission status: MedSurg admission   Emeline General MD [facility] Pager [phone] 08/07/2022, 7:45 PM

## 2022-08-07 NOTE — ED Notes (Signed)
ED TO INPATIENT HANDOFF REPORT  ED Nurse Name and Phone #: Grant Fontana PM [phone]  S Name/Age/Gender Bernard [name] 89 y.o. male Room/Bed: 021C/021C  Code Status   Code Status: DNR  Home/SNF/Other Home Patient oriented to: self, place, time, and situation Is this baseline? Yes   Triage Complete: Triage complete  Chief Complaint Portal vein thrombosis [I81]  Triage Note No notes on file   Allergies No Known Allergies  Level of Care/Admitting Diagnosis ED Disposition     ED Disposition  Admit   Condition  --   Comment  Hospital Area: [facility] [[id number]]  Level of Care: Med-Surg [16]  May admit patient to [facility] or [facility] if equivalent level of care is available:: No  Covid Evaluation: Asymptomatic - no recent exposure (last 10 days) testing not required  Diagnosis: Portal vein thrombosis [452.ICD-9-CM]  Admitting Physician: Emeline General [[id number]]  Attending Physician: [facility] General [[id number]]  Certification:: I certify this patient will need inpatient services for at least 2 midnights  Estimated Length of Stay: 2          B Medical/Surgery History Past Medical History:  Diagnosis Date   CAD (coronary artery disease)    s/p MI 1976, normal myoview 7/11   Depressive disorder, not elsewhere classified    Esophageal reflux    Headache    migraines occ., none in months.   History of BPH    benign prostatic hypertrophy   Labyrinthitis, unspecified    Malignant neoplasm middle lobe, bronchus or lung    right lung. Dx 07/27/08, sp/p resection 7/10.    Osteoarthrosis, unspecified whether generalized or localized, lower leg    knee   Other and unspecified hyperlipidemia    Paroxysmal atrial fibrillation (HCC)    postoperative after lung surgery   Renal cyst, right    being followed   Stroke [facility])    TIA March 2000     no side effects now   Unspecified essential hypertension    Past Surgical History:  Procedure Laterality  Date   anthroscopic surgery  '10   right knee \   BUNIONECTOMY  '89   left foot   CATARACT EXTRACTION, BILATERAL     ESOPHAGOGASTRODUODENOSCOPY  05/14/04   EYE SURGERY     both eyes - 2011 ([place])   INGUINAL HERNIA REPAIR  '71   left   right TKR  8/11   TOTAL KNEE ARTHROPLASTY Left 09/24/2014   Procedure: LEFT TOTAL KNEE ARTHROPLASTY;  Surgeon: Ollen Gross, MD;  Location: [facility];  Service: Orthopedics;  Laterality: Left;   VAT-RML lobectomy       A IV Location/Drains/Wounds Patient Lines/Drains/Airways Status     Active Line/Drains/Airways     Name Placement date Placement time Site Days   Peripheral IV 08/07/22 20 G Anterior;Left;Proximal Forearm 08/07/22  1245  Forearm  less than 1   Incision (Closed) 09/24/14 Leg Left 09/24/14  1102  -- 2874            Intake/Output Last 24 hours No intake or output data in the 24 hours ending 08/07/22 2017  Labs/Imaging Results for orders placed or performed during the hospital encounter of 08/07/22 (from the past 48 hour(s))  CBC with Differential     Status: Abnormal   Collection Time: 08/07/22 12:45 PM  Result Value Ref Range   WBC 10.5 4.0 - 10.5 K/uL   RBC 4.13 (L) 4.22 - 5.81 MIL/uL   Hemoglobin 12.5 (  L) 13.0 - 17.0 g/dL   HCT 04.5 (L) 40.9 - 81.1 %   MCV 93.7 80.0 - 100.0 fL   MCH 30.3 26.0 - 34.0 pg   MCHC 32.3 30.0 - 36.0 g/dL   RDW 91.4 78.2 - 95.6 %   Platelets 102 (L) 150 - 400 K/uL   nRBC 0.0 0.0 - 0.2 %   Neutrophils Relative % 81 %   Neutro Abs 8.5 (H) 1.7 - 7.7 K/uL   Lymphocytes Relative 8 %   Lymphs Abs 0.9 0.7 - 4.0 K/uL   Monocytes Relative 9 %   Monocytes Absolute 1.0 0.1 - 1.0 K/uL   Eosinophils Relative 1 %   Eosinophils Absolute 0.1 0.0 - 0.5 K/uL   Basophils Relative 0 %   Basophils Absolute 0.0 0.0 - 0.1 K/uL   Immature Granulocytes 1 %   Abs Immature Granulocytes 0.06 0.00 - 0.07 K/uL    Comment: Performed at [facility], [address], [place], [place] [address]  Comprehensive  metabolic panel     Status: Abnormal   Collection Time: 08/07/22 12:45 PM  Result Value Ref Range   Sodium 135 135 - 145 mmol/L   Potassium 3.9 3.5 - 5.1 mmol/L   Chloride 101 98 - 111 mmol/L   CO2 24 22 - 32 mmol/L   Glucose, Bld 161 (H) 70 - 99 mg/dL    Comment: Glucose reference range applies only to samples taken after fasting for at least 8 hours.   BUN 30 (H) 8 - 23 mg/dL   Creatinine, Ser 6.57 0.61 - 1.24 mg/dL   Calcium 8.4 (L) 8.9 - 10.3 mg/dL   Total Protein 5.9 (L) 6.5 - 8.1 g/dL   Albumin 2.9 (L) 3.5 - 5.0 g/dL   AST 82 (H) 15 - 41 U/L   ALT 53 (H) 0 - 44 U/L   Alkaline Phosphatase 129 (H) 38 - 126 U/L   Total Bilirubin 0.9 0.3 - 1.2 mg/dL   GFR, Estimated >84 >69 mL/min    Comment: (NOTE) Calculated using the CKD-EPI Creatinine Equation (2021)    Anion gap 10 5 - 15    Comment: Performed at [facility], [address], [place], [place] [address]  Lipase, blood     Status: None   Collection Time: 08/07/22 12:45 PM  Result Value Ref Range   Lipase 26 11 - 51 U/L    Comment: Performed at [facility], [address], [place], [place] [address]  Protime-INR     Status: Abnormal   Collection Time: 08/07/22 12:45 PM  Result Value Ref Range   Prothrombin Time 31.6 (H) 11.4 - 15.2 seconds   INR 3.0 (H) 0.8 - 1.2    Comment: (NOTE) INR goal varies based on device and disease states. Performed at [facility], [address], [place], [place] [address]   Brain natriuretic peptide     Status: Abnormal   Collection Time: 08/07/22 12:45 PM  Result Value Ref Range   B Natriuretic Peptide 170.8 (H) 0.0 - 100.0 pg/mL    Comment: Performed at [facility], [address], [place], [place] [address]  Troponin I (High Sensitivity)     Status: None   Collection Time: 08/07/22 12:45 PM  Result Value Ref Range   Troponin I (High Sensitivity) 12 <18 ng/L    Comment: (NOTE) Elevated high sensitivity troponin I (hsTnI) values and significant  changes  across serial measurements may  suggest ACS but many other  chronic and acute conditions are known to elevate hsTnI results.  Refer to the "Links" section for chest pain algorithms and additional  guidance. Performed at [facility], [address], [place], [place] [address]   Urinalysis, Routine w reflex microscopic -Urine, Clean Catch     Status: Abnormal   Collection Time: 08/07/22  4:28 PM  Result Value Ref Range   Color, Urine YELLOW YELLOW   APPearance CLEAR CLEAR   Specific Gravity, Urine >1.046 (H) 1.005 - 1.030   pH 6.0 5.0 - 8.0   Glucose, UA NEGATIVE NEGATIVE mg/dL   Hgb urine dipstick NEGATIVE NEGATIVE   Bilirubin Urine NEGATIVE NEGATIVE   Ketones, ur NEGATIVE NEGATIVE mg/dL   Protein, ur NEGATIVE NEGATIVE mg/dL   Nitrite NEGATIVE NEGATIVE   Leukocytes,Ua NEGATIVE NEGATIVE    Comment: Performed at [facility], [address] [place], [place] [address]   CT ABDOMEN PELVIS W CONTRAST  Result Date: 08/07/2022 CLINICAL DATA:  Right upper quadrant abdominal pain * Tracking Code: BO * EXAM: CT ABDOMEN AND PELVIS WITH CONTRAST TECHNIQUE: Multidetector CT imaging of the abdomen and pelvis was performed using the standard protocol following bolus administration of intravenous contrast. RADIATION DOSE REDUCTION: This exam was performed according to the departmental dose-optimization program which includes automated exposure control, adjustment of the mA and/or kV according to patient size and/or use of iterative reconstruction technique. CONTRAST:  75mL OMNIPAQUE IOHEXOL 350 MG/ML SOLN COMPARISON:  04/01/2012 FINDINGS: Lower chest: Mild cardiomegaly.  Right infrahilar clips are noted. New small pulmonary nodules are present in both lung bases. In the right lower lobe an index nodule measures 1.0 by 0.7 cm on image 19 series 4. In the lingula a nodule measures 6 by 5 mm on image 3 series 4. Multiple additional nodules are present. Hepatobiliary: Suspected portal vein  thrombosis anteriorly in the right hepatic lobe (segments 5 and 8) associated with a heterogeneous 14.4 by 9.6 cm mass inferiorly in the right hepatic lobe. Some of this measurement may reflect hypoenhancing but non malignant hepatic parenchymal tissue related to the portal vein thrombosis, but most of this demonstrates heterogeneous enhancement highly suspicious for malignancy. There is thickening and irregularity within the gallbladder suspicious for a mass inside the gallbladder lumen, image 55 series 6. Gallbladder adenocarcinoma is not excluded. Hepatic lesion could alternatively be related to hepatocellular carcinoma given the portal vein thrombosis. Speckled hypodensities are observed in segment 8 of the liver and may be related to portal vein thrombosis or additional small tumors. Pancreas: Unremarkable Spleen: Speckled calcifications along the lateral margin of the spleen. Otherwise unremarkable. Adrenals/Urinary Tract: Both adrenal glands appear normal. Benign bilateral renal cysts are observed. No further imaging workup of these lesions is indicated. 2 mm right mid kidney nonobstructive renal calculus. Two right renal arteries, the more caudad of which extends from the vicinity of the bifurcation to the lower pole. There is scarring of the left kidney lower pole. Stomach/Bowel: Sigmoid colon diverticulosis. Vascular/Lymphatic: Atherosclerosis is present, including aortoiliac atherosclerotic disease. Heart and soft plaque causing stenosis of the proximal SMA, without overt occlusion identified. Peripancreatic/porta hepatis lymph node enlarged at 1.3 cm in short axis on image 26 series 3. Other porta hepatis lymph nodes are upper normal in size. Reproductive: Prominent prostatomegaly with the prostate gland measuring 6.2 by 6.3 by 7.5 cm (volume = 150 cm^3). Other: Trace ascites in the right paracolic gutter. Musculoskeletal: Probable hemangioma eccentric to the left in the L2  vertebral body. IMPRESSION: 1.  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 2. Large mass in the inferior right hepatic lobe with associated portal vein thrombosis in segments 5 and 8. This is highly suspicious for malignancy, possibly from metastatic gallbladder adenocarcinoma or hepatocellular carcinoma. 3. New multiple small pulmonary nodules in the lung bases, concerning for metastatic disease. 4. Mildly enlarged peripancreatic/porta hepatis lymph node. 5. Trace ascites in the right paracolic gutter. 6. Prominent prostatomegaly. 7. Aortic atherosclerosis. Aortic Atherosclerosis (ICD10-I70.0). Electronically Signed   By: Gaylyn Rong M.D.   On: 08/07/2022 15:51   DG Chest Port 1 View  Result Date: 08/07/2022 CLINICAL DATA:  Fatigue EXAM: PORTABLE CHEST 1 VIEW COMPARISON:  X-ray 08/05/2018 and older FINDINGS: Underinflation. Film is slightly rotated to the left. There is some linear opacity at the bases likely atelectasis. New from prior. No pneumothorax, effusion or edema. Overlapping cardiac leads. Stable cardiopericardial silhouette when adjusting for level of inflation. Surgical changes overlie the lower right chest. IMPRESSION: Underinflation with new presumed basilar atelectasis. Rotated radiograph Electronically Signed   By: Karen Kays M.D.   On: 08/07/2022 13:23    Pending Labs Unresulted Labs (From admission, onward)     Start     Ordered   08/08/22 0500  Protime-INR  Daily,   R      08/07/22 1810   08/08/22 0500  CBC  Tomorrow morning,   R        08/07/22 1944   08/08/22 0500  Comprehensive metabolic panel  Tomorrow morning,   R        08/07/22 1944   08/07/22 1942  AFP tumor marker  Once,   URGENT        08/07/22 1941   08/07/22 1942  Cancer antigen 19-9  Once,   URGENT        08/07/22 1941   08/07/22 1942  CEA  Once,   URGENT        08/07/22 1941            Vitals/Pain Today's Vitals   08/07/22 1645 08/07/22 1804  08/07/22 1845 08/07/22 1915  BP: 133/89  118/67 110/74  Pulse: 86  92 82  Resp: 16  19 19   Temp:      TempSrc:      SpO2: 95%  95% 96%  Weight:  197 lb 15.6 oz (89.8 kg)    Height:  5\' 10"  (1.778 m)    PainSc:        Isolation Precautions No active isolations  Medications Medications  HYDROcodone-acetaminophen (NORCO/VICODIN) 5-325 MG per tablet 1 tablet (has no administration in time range)  simvastatin (ZOCOR) tablet 20 mg (has no administration in time range)  terazosin (HYTRIN) capsule 5 mg (has no administration in time range)  ondansetron (ZOFRAN) injection 4 mg (has no administration in time range)  HYDROmorphone (DILAUDID) injection 0.5 mg (has no administration in time range)  finasteride (PROSCAR) tablet 5 mg (has no administration in time range)  iohexol (OMNIPAQUE) 350 MG/ML injection 75 mL (75 mLs Intravenous Contrast Given 08/07/22 1517)    Mobility walks     Focused Assessments     R Recommendations: See Admitting Provider Note  Report given to:   Additional Notes:

## 2022-08-07 NOTE — Progress Notes (Signed)
ANTICOAGULATION CONSULT NOTE - Initial Consult  Pharmacy Consult for Heparin Indication:  portal vein thrombosis  No Known Allergies  Patient Measurements: Weight: 89.8 kg (198 lb) Heparin Dosing Weight: 89.8 kg  Vital Signs: Temp: 97.9 F (36.6 C) (06/14 1638) Temp Source: Oral (06/14 1638) BP: 133/89 (06/14 1645) Pulse Rate: 86 (06/14 1645)  Labs: Recent Labs    08/05/22 1226 08/07/22 1245  HGB 13.4 12.5*  HCT 41.4 38.7*  PLT 123.0* 102*  LABPROT  --  31.6*  INR  --  3.0*  CREATININE 1.08 1.05  TROPONINIHS  --  12    Estimated Creatinine Clearance: 55.9 mL/min (by C-G formula based on SCr of 1.05 mg/dL).   Medical History: Past Medical History:  Diagnosis Date   CAD (coronary artery disease)    s/p MI 1976, normal myoview 7/11   Depressive disorder, not elsewhere classified    Esophageal reflux    Headache    migraines occ., none in months.   History of BPH    benign prostatic hypertrophy   Labyrinthitis, unspecified    Malignant neoplasm middle lobe, bronchus or lung    right lung. Dx 07/27/08, sp/p resection 7/10.    Osteoarthrosis, unspecified whether generalized or localized, lower leg    knee   Other and unspecified hyperlipidemia    Paroxysmal atrial fibrillation (HCC)    postoperative after lung surgery   Renal cyst, right    being followed   Stroke [facility])    TIA March 2000     no side effects now   Unspecified essential hypertension     Medications:  (Not in a hospital admission)  Scheduled:  Infusions:  PRN:   Assessment: 89 yom with a history of CAD, AF on warfarin, stroke. Patient is presenting with abdominal pain. Heparin per pharmacy consult placed for  portal vein thrombosis .  Patient is on warfarin prior to arrival. Home dose is 2.5mg  daily per last anticoag note Med rec states taking 5mg  on Monday and 2.5mg  all other days per patient. Last dose 6/13 pm.   Hgb 12.5; plt 102 INR 3  Goal of Therapy:  Heparin level 0.3-0.7  units/ml Monitor platelets by anticoagulation protocol: Yes   Plan:  Per discussion with ED provider, plan to start heparin once INR 2. Could consider heparin start sooner if patient condition worsens. Monitor INR Continue to monitor H&H and platelets  Delmar Landau, PharmD, BCPS 08/07/2022 5:49 PM ED Clinical Pharmacist -  [phone]

## 2022-08-07 NOTE — ED Provider Notes (Signed)
[place] EMERGENCY DEPARTMENT AT [facility] Provider Note   CSN: [id number] Arrival date & time: 08/07/22  1208     History  Chief Complaint  Patient presents with   Abdominal Pain    Coming from home. Hx of a fib. On coumadin. Pt complaining of increased R sided abd pain. Was just at pcp and told has gallstones and to come to ed if pain is getting worse. Pt denies any urinary symptoms or hx of kidney stones. VSS pta. Pt up walking during triage.     Bernard [name] is a 89 y.o. male.  89 year old male brought in by family from home with complaint of RUQ abdominal pain with fatigue. Patient went to PCP for fatigue, found to have RUQ tenderness with CT report showing gallstones in 2017. Patient was started on Augmentin and scheduled for outpatient imaging. Family reports worsening fatigue, more so with exertion, ongoing RUQ abdominal pain. Denies nausea, vomiting, fevers, chills, changes in bowel or bladder habits. Prior abdominal surgeries include left groin hernia repair in the 1970s. Denies CP, reports some SHOB with exertion, no orthopnea, no lower extremity edema. Patient is on coumadin for a fib.        Home Medications Prior to Admission medications   Medication Sig Start Date End Date Taking? Authorizing Provider  amoxicillin-clavulanate (AUGMENTIN) 875-125 MG tablet Take 1 tablet by mouth 2 (two) times daily. 08/05/22   Plotnikov, Georgina Quint, MD  cholecalciferol (VITAMIN D) 1000 UNITS tablet Take 1,000 Units by mouth daily.    [provider]  diazepam (VALIUM) 5 MG tablet Take 1 tablet (5 mg total) by mouth 2 (two) times daily as needed for anxiety. 02/05/21   Plotnikov, Georgina Quint, MD  finasteride (PROSCAR) 5 MG tablet Take 1 tablet (5 mg total) by mouth daily. 02/05/22   Plotnikov, Georgina Quint, MD  HYDROcodone-acetaminophen (NORCO/VICODIN) 5-325 MG tablet Take 1 tablet by mouth every 6 (six) hours as needed for severe pain. 08/05/22 08/05/23  Plotnikov,  Georgina Quint, MD  multivitamin-lutein (OCUVITE-LUTEIN) CAPS Take 1 capsule by mouth daily.    [provider]  nitroGLYCERIN (NITROSTAT) 0.4 MG SL tablet Place 1 tablet (0.4 mg total) under the tongue every 5 (five) minutes as needed for chest pain (3 DOSES MAX). 02/05/21   Plotnikov, Georgina Quint, MD  ondansetron (ZOFRAN) 4 MG tablet Take 1 tablet (4 mg total) by mouth every 8 (eight) hours as needed for nausea or vomiting. 08/05/22   Plotnikov, Georgina Quint, MD  simvastatin (ZOCOR) 20 MG tablet Take 1 tablet (20 mg total) by mouth daily. 02/05/22   Plotnikov, Georgina Quint, MD  terazosin (HYTRIN) 5 MG capsule Take 1 capsule (5 mg total) by mouth daily. 02/05/22   Plotnikov, Georgina Quint, MD  warfarin (COUMADIN) 5 MG tablet Take 1 tablet daily or Take as directed by anticoagulation clinic. 02/05/22   Plotnikov, Georgina Quint, MD      Allergies    Patient has no known allergies.    Review of Systems   Review of Systems Negative except as per HPI Physical Exam Updated Vital Signs BP 105/75   Pulse 78   Temp 98.2 F (36.8 C) (Oral)   Resp 12   Wt 89.8 kg   SpO2 94%   BMI 28.41 kg/m  Physical Exam Vitals and nursing note reviewed.  Constitutional:      General: He is not in acute distress.    Appearance: He is well-developed. He is not diaphoretic.  HENT:     Head: Normocephalic and atraumatic.  Cardiovascular:     Rate and Rhythm: Normal rate. Rhythm irregular.     Heart sounds: Normal heart sounds.  Pulmonary:     Effort: Pulmonary effort is normal.     Breath sounds: Normal breath sounds.  Abdominal:     Palpations: Abdomen is soft.     Tenderness: There is abdominal tenderness in the right upper quadrant.  Musculoskeletal:     Right lower leg: No edema.     Left lower leg: No edema.  Skin:    General: Skin is warm and dry.     Findings: No erythema or rash.  Neurological:     Mental Status: He is alert and oriented to person, place, and time.  Psychiatric:        Behavior:  Behavior normal.     ED Results / Procedures / Treatments   Labs (all labs ordered are listed, but only abnormal results are displayed) Labs Reviewed  CBC WITH DIFFERENTIAL/PLATELET - Abnormal; Notable for the following components:      Result Value   RBC 4.13 (*)    Hemoglobin 12.5 (*)    HCT 38.7 (*)    Platelets 102 (*)    Neutro Abs 8.5 (*)    All other components within normal limits  COMPREHENSIVE METABOLIC PANEL - Abnormal; Notable for the following components:   Glucose, Bld 161 (*)    BUN 30 (*)    Calcium 8.4 (*)    Total Protein 5.9 (*)    Albumin 2.9 (*)    AST 82 (*)    ALT 53 (*)    Alkaline Phosphatase 129 (*)    All other components within normal limits  PROTIME-INR - Abnormal; Notable for the following components:   Prothrombin Time 31.6 (*)    INR 3.0 (*)    All other components within normal limits  BRAIN NATRIURETIC PEPTIDE - Abnormal; Notable for the following components:   B Natriuretic Peptide 170.8 (*)    All other components within normal limits  LIPASE, BLOOD  URINALYSIS, ROUTINE W REFLEX MICROSCOPIC  TROPONIN I (HIGH SENSITIVITY)    EKG None  Radiology DG Chest Port 1 View  Result Date: 08/07/2022 CLINICAL DATA:  Fatigue EXAM: PORTABLE CHEST 1 VIEW COMPARISON:  X-ray 08/05/2018 and older FINDINGS: Underinflation. Film is slightly rotated to the left. There is some linear opacity at the bases likely atelectasis. New from prior. No pneumothorax, effusion or edema. Overlapping cardiac leads. Stable cardiopericardial silhouette when adjusting for level of inflation. Surgical changes overlie the lower right chest. IMPRESSION: Underinflation with new presumed basilar atelectasis. Rotated radiograph Electronically Signed   By: Karen Kays M.D.   On: 08/07/2022 13:23    Procedures Procedures    Medications Ordered in ED Medications - No data to display  ED Course/ Medical Decision Making/ A&P                             Medical Decision  Making Amount and/or Complexity of Data Reviewed Labs: ordered. Radiology: ordered.   This patient presents to the ED for concern of fatigue, abdominal pain, this involves an extensive number of treatment options, and is a complaint that carries with it a high risk of complications and morbidity.  The differential diagnosis includes but not limited to anemia, hyperglycemia, cholelithiasis, acute cholecystitis, CHF, ACS   Co morbidities that complicate the patient evaluation  PAF, HTN, lung cancer, CAD, CVA   Additional history obtained:  Additional history obtained from family at bedside who contributes to history as above  External records from outside source obtained and reviewed including prior labs on file for comparison    Lab Tests:  I Ordered, and personally interpreted labs.  The pertinent results include: CBC with normal CBC, hemoglobin 12.5.  CMP with mildly elevated AST at 82, ALT at 53 with alk phos of 29.  Lipase is normal.  BNP minimally elevated at 170.  INR 3.0, therapeutic.  Troponin is 12.  Pending urinalysis at time of signout to oncoming provider.   Imaging Studies ordered:  I ordered imaging studies including chest x-ray, CT abdomen pelvis Pending at signout to oncoming provider   Cardiac Monitoring: / EKG:  The patient was maintained on a cardiac monitor.  I personally viewed and interpreted the cardiac monitored which showed an underlying rhythm of: A-fib, 88   Problem List / ED Course / Critical interventions / Medication management  89 year old male brought in by family with concern for fatigue and right upper quadrant abdominal pain, currently on Augmentin for possible bladder infection from PCP office.  On exam, does have right upper quadrant tenderness, no lower extremity edema, lungs clear to auscultation.  Labs with mildly elevated LFTs.  Pending imaging.  Care signed out to oncoming provider at change shift. I have reviewed the patients home  medicines and have made adjustments as needed   Social Determinants of Health:  Lives with family   Test / Admission - Considered:  Disposition pending at time of signout to oncoming provider         Final Clinical Impression(s) / ED Diagnoses Final diagnoses:  None    Rx / DC Orders ED Discharge Orders     None         Jeannie Fend, PA-C 08/07/22 1511    Margarita Grizzle, MD 08/07/22 1630

## 2022-08-08 ENCOUNTER — Inpatient Hospital Stay (HOSPITAL_COMMUNITY): Payer: MEDICARE

## 2022-08-08 DIAGNOSIS — R109 Unspecified abdominal pain: Secondary | ICD-10-CM

## 2022-08-08 DIAGNOSIS — I81 Portal vein thrombosis: Secondary | ICD-10-CM | POA: Diagnosis not present

## 2022-08-08 DIAGNOSIS — K828 Other specified diseases of gallbladder: Secondary | ICD-10-CM

## 2022-08-08 LAB — COMPREHENSIVE METABOLIC PANEL
ALT: 55 U/L — ABNORMAL HIGH (ref 0–44)
AST: 85 U/L — ABNORMAL HIGH (ref 15–41)
Albumin: 2.7 g/dL — ABNORMAL LOW (ref 3.5–5.0)
Alkaline Phosphatase: 123 U/L (ref 38–126)
Anion gap: 10 (ref 5–15)
BUN: 25 mg/dL — ABNORMAL HIGH (ref 8–23)
CO2: 25 mmol/L (ref 22–32)
Calcium: 8.3 mg/dL — ABNORMAL LOW (ref 8.9–10.3)
Chloride: 100 mmol/L (ref 98–111)
Creatinine, Ser: 0.95 mg/dL (ref 0.61–1.24)
GFR, Estimated: >60 mL/min (ref >60)
Glucose, Bld: 135 mg/dL — ABNORMAL HIGH (ref 70–99)
Potassium: 4.3 mmol/L (ref 3.5–5.1)
Sodium: 135 mmol/L (ref 135–145)
Total Bilirubin: 1 mg/dL (ref 0.3–1.2)
Total Protein: 5.7 g/dL — ABNORMAL LOW (ref 6.5–8.1)

## 2022-08-08 LAB — CBC
HCT: 37.3 % — ABNORMAL LOW (ref 39.0–52.0)
Hemoglobin: 12 g/dL — ABNORMAL LOW (ref 13.0–17.0)
MCH: 29.4 pg (ref 26.0–34.0)
MCHC: 32.2 g/dL (ref 30.0–36.0)
MCV: 91.4 fL (ref 80.0–100.0)
Platelets: 91 10*3/uL — ABNORMAL LOW (ref 150–400)
RBC: 4.08 MIL/uL — ABNORMAL LOW (ref 4.22–5.81)
RDW: 13.3 % (ref 11.5–15.5)
WBC: 11 10*3/uL — ABNORMAL HIGH (ref 4.0–10.5)
nRBC: 0 % (ref 0.0–0.2)

## 2022-08-08 LAB — PROTIME-INR
INR: 3.5 — ABNORMAL HIGH (ref 0.8–1.2)
Prothrombin Time: 35.1 seconds — ABNORMAL HIGH (ref 11.4–15.2)

## 2022-08-08 MED ORDER — GADOBUTROL 1 MMOL/ML IV SOLN
9.0000 mL | Freq: Once | INTRAVENOUS | Status: AC | PRN
  Administered 2022-08-08: 9 mL via INTRAVENOUS

## 2022-08-08 NOTE — Plan of Care (Signed)
  Problem: Education: Goal: Knowledge of General Education information will improve Description: Including pain rating scale, medication(s)/side effects and non-pharmacologic comfort measures 08/08/2022 0529 by Lisabeth Pick, RN Outcome: Progressing 08/07/2022 2142 by Lisabeth Pick, RN Outcome: Progressing   Problem: Health Behavior/Discharge Planning: Goal: Ability to manage health-related needs will improve 08/08/2022 0529 by Lisabeth Pick, RN Outcome: Progressing 08/07/2022 2142 by Lisabeth Pick, RN Outcome: Progressing   Problem: Clinical Measurements: Goal: Ability to maintain clinical measurements within normal limits will improve 08/08/2022 0529 by Lisabeth Pick, RN Outcome: Progressing 08/07/2022 2142 by Lisabeth Pick, RN Outcome: Progressing Goal: Will remain free from infection 08/08/2022 0529 by Lisabeth Pick, RN Outcome: Progressing 08/07/2022 2142 by Lisabeth Pick, RN Outcome: Progressing Goal: Diagnostic test results will improve 08/08/2022 0529 by Lisabeth Pick, RN Outcome: Progressing 08/07/2022 2142 by Lisabeth Pick, RN Outcome: Progressing Goal: Respiratory complications will improve 08/08/2022 0529 by Lisabeth Pick, RN Outcome: Progressing 08/07/2022 2142 by Lisabeth Pick, RN Outcome: Progressing Goal: Cardiovascular complication will be avoided 08/08/2022 0529 by Lisabeth Pick, RN Outcome: Progressing 08/07/2022 2142 by Lisabeth Pick, RN Outcome: Progressing   Problem: Activity: Goal: Risk for activity intolerance will decrease 08/08/2022 0529 by Lisabeth Pick, RN Outcome: Progressing 08/07/2022 2142 by Lisabeth Pick, RN Outcome: Progressing   Problem: Nutrition: Goal: Adequate nutrition will be maintained 08/08/2022 0529 by Lisabeth Pick, RN Outcome: Progressing 08/07/2022 2142 by Lisabeth Pick, RN Outcome: Progressing   Problem: Activity: Goal: Risk for activity intolerance will decrease 08/08/2022 0529 by Lisabeth Pick, RN Outcome: Progressing 08/07/2022 2142 by Lisabeth Pick, RN Outcome: Progressing   Problem: Pain Managment: Goal: General experience of comfort will improve 08/08/2022 0529 by Lisabeth Pick, RN Outcome: Progressing 08/07/2022 2142 by Lisabeth Pick, RN Outcome: Progressing   Problem: Safety: Goal: Ability to remain free from injury will improve 08/08/2022 0529 by Lisabeth Pick, RN Outcome: Progressing 08/07/2022 2142 by Lisabeth Pick, RN Outcome: Progressing   Problem: Skin Integrity: Goal: Risk for impaired skin integrity will decrease 08/08/2022 0529 by Lisabeth Pick, RN Outcome: Progressing 08/07/2022 2142 by Lisabeth Pick, RN Outcome: Progressing

## 2022-08-08 NOTE — Consult Note (Addendum)
Bernard [name] 07/27/1932  [id number].    Requesting MD: Dr. Chipper Herb Chief Complaint/Reason for Consult: liver mass  HPI:  Bernard [name] is an 89 yo male with a history of lung cancer, HTN, and a-fib (on Coumadin) who presented to the ED last night with RUQ abdominal pain. He has been having intermittent RUQ pain for the last few weeks. He has also developed fatigue. He saw his PCP and had a CXR which was normal. He was scheduled for a RUQ [place] next week. He was also started on Augmentin empirically for suspected cholecystitis. Yesterday his pain worsened, prompting him to go to the ED. Labs showed mildly elevated transaminases and alk phos. A CT abd/pelvis showed a large mass in the right liver with a right portal vein thrombus, as well as a possible gallbladder mass. Tumor markers are pending. He denies any known history of liver disease and has no history of heavy EtOH use.  His only prior abdominal surgery is an inguinal hernia repair in the 1970s.  ROS: Review of Systems  Constitutional:  Positive for weight loss. Negative for chills and fever.  Gastrointestinal:  Positive for abdominal pain.    Family History  Problem Relation Age of Onset   Diabetes insipidus Unknown        2nd kin    Coronary artery disease Neg Hx    Colon cancer Neg Hx    Prostate cancer Neg Hx    Heart attack Neg Hx     Past Medical History:  Diagnosis Date   CAD (coronary artery disease)    s/p MI 1976, normal myoview 7/11   Depressive disorder, not elsewhere classified    Esophageal reflux    Headache    migraines occ., none in months.   History of BPH    benign prostatic hypertrophy   Labyrinthitis, unspecified    Malignant neoplasm middle lobe, bronchus or lung    right lung. Dx 07/27/08, sp/p resection 7/10.    Osteoarthrosis, unspecified whether generalized or localized, lower leg    knee   Other and unspecified hyperlipidemia    Paroxysmal atrial fibrillation (HCC)    postoperative after lung  surgery   Renal cyst, right    being followed   Stroke [facility])    TIA March 2000     no side effects now   Unspecified essential hypertension     Past Surgical History:  Procedure Laterality Date   anthroscopic surgery  '10   right knee \   BUNIONECTOMY  '89   left foot   CATARACT EXTRACTION, BILATERAL     ESOPHAGOGASTRODUODENOSCOPY  05/14/04   EYE SURGERY     both eyes - 2011 ([facility])   INGUINAL HERNIA REPAIR  '71   left   right TKR  8/11   TOTAL KNEE ARTHROPLASTY Left 09/24/2014   Procedure: LEFT TOTAL KNEE ARTHROPLASTY;  Surgeon: Ollen Gross, MD;  Location: [facility];  Service: Orthopedics;  Laterality: Left;   VAT-RML lobectomy      Social History:  reports that he has never smoked. He has never used smokeless tobacco. He reports that he does not drink alcohol and does not use drugs.  Allergies: No Known Allergies  Medications Prior to Admission  Medication Sig Dispense Refill   amoxicillin-clavulanate (AUGMENTIN) 875-125 MG tablet Take 1 tablet by mouth 2 (two) times daily. 20 tablet 0   cholecalciferol (VITAMIN D) 1000 UNITS tablet Take 1,000 Units by mouth daily. Take 1 tablet at 6 pm.  finasteride (PROSCAR) 5 MG tablet Take 1 tablet (5 mg total) by mouth daily. (Patient taking differently: Take 5 mg by mouth daily. Take 1 tablet at 6 pm) 90 tablet 3   HYDROcodone-acetaminophen (NORCO/VICODIN) 5-325 MG tablet Take 1 tablet by mouth every 6 (six) hours as needed for severe pain. 20 tablet 0   multivitamin-lutein (OCUVITE-LUTEIN) CAPS Take 1 capsule by mouth daily.     Polyvinyl Alcohol-Povidone (REFRESH OP) Apply 2 drops to eye 2 (two) times daily.     simvastatin (ZOCOR) 20 MG tablet Take 1 tablet (20 mg total) by mouth daily. (Patient taking differently: Take 20 mg by mouth daily. Take at 6pm) 90 tablet 3   terazosin (HYTRIN) 5 MG capsule Take 1 capsule (5 mg total) by mouth daily. (Patient taking differently: Take 5 mg by mouth every other day. Take every other night at 6  pm.) 90 capsule 3   warfarin (COUMADIN) 5 MG tablet Take 1 tablet daily or Take as directed by anticoagulation clinic. (Patient taking differently: Take 2.5 mg by mouth daily at 6 PM. Take 1 tablet by mouth on Monday at 6 pm, and one-half pill on other days at 6pm *Subject to change*) 90 tablet 3   White Petrolatum-Mineral Oil (GENTEAL TEARS NIGHT-TIME OP) Apply 1 Dose to eye at bedtime.     ondansetron (ZOFRAN) 4 MG tablet Take 1 tablet (4 mg total) by mouth every 8 (eight) hours as needed for nausea or vomiting. 20 tablet 0     Physical Exam: Blood pressure 117/89, pulse 89, temperature 98 F (36.7 C), temperature source Oral, resp. rate 20, height 5\' 10"  (1.778 m), weight 89.8 kg, SpO2 97 %. General: resting comfortably, appears stated age, no apparent distress Neurological: alert and oriented, no focal deficits HEENT: normocephalic, atraumatic, no scleral icterus Respiratory: normal work of breathing, lungs clear to auscultation bilaterally, symmetric chest wall expansion Abdomen: soft, nondistended, nontender to deep palpation. No masses or organomegaly. Extremities: warm and well-perfused, no deformities, moving all extremities spontaneously Psychiatric: normal mood and affect Skin: warm and dry, no jaundice, no rashes or lesions   Results for orders placed or performed during the hospital encounter of 08/07/22 (from the past 48 hour(s))  CBC with Differential     Status: Abnormal   Collection Time: 08/07/22 12:45 PM  Result Value Ref Range   WBC 10.5 4.0 - 10.5 K/uL   RBC 4.13 (L) 4.22 - 5.81 MIL/uL   Hemoglobin 12.5 (L) 13.0 - 17.0 g/dL   HCT 32.4 (L) 40.1 - 02.7 %   MCV 93.7 80.0 - 100.0 fL   MCH 30.3 26.0 - 34.0 pg   MCHC 32.3 30.0 - 36.0 g/dL   RDW 25.3 66.4 - 40.3 %   Platelets 102 (L) 150 - 400 K/uL   nRBC 0.0 0.0 - 0.2 %   Neutrophils Relative % 81 %   Neutro Abs 8.5 (H) 1.7 - 7.7 K/uL   Lymphocytes Relative 8 %   Lymphs Abs 0.9 0.7 - 4.0 K/uL   Monocytes Relative  9 %   Monocytes Absolute 1.0 0.1 - 1.0 K/uL   Eosinophils Relative 1 %   Eosinophils Absolute 0.1 0.0 - 0.5 K/uL   Basophils Relative 0 %   Basophils Absolute 0.0 0.0 - 0.1 K/uL   Immature Granulocytes 1 %   Abs Immature Granulocytes 0.06 0.00 - 0.07 K/uL    Comment: Performed at [facility], [address] [place], [place] [address]  Comprehensive metabolic panel  Status: Abnormal   Collection Time: 08/07/22 12:45 PM  Result Value Ref Range   Sodium 135 135 - 145 mmol/L   Potassium 3.9 3.5 - 5.1 mmol/L   Chloride 101 98 - 111 mmol/L   CO2 24 22 - 32 mmol/L   Glucose, Bld 161 (H) 70 - 99 mg/dL    Comment: Glucose reference range applies only to samples taken after fasting for at least 8 hours.   BUN 30 (H) 8 - 23 mg/dL   Creatinine, Ser 1.61 0.61 - 1.24 mg/dL   Calcium 8.4 (L) 8.9 - 10.3 mg/dL   Total Protein 5.9 (L) 6.5 - 8.1 g/dL   Albumin 2.9 (L) 3.5 - 5.0 g/dL   AST 82 (H) 15 - 41 U/L   ALT 53 (H) 0 - 44 U/L   Alkaline Phosphatase 129 (H) 38 - 126 U/L   Total Bilirubin 0.9 0.3 - 1.2 mg/dL   GFR, Estimated >09 >60 mL/min    Comment: (NOTE) Calculated using the CKD-EPI Creatinine Equation (2021)    Anion gap 10 5 - 15    Comment: Performed at [facility], [address], [place], [place] [address]  Lipase, blood     Status: None   Collection Time: 08/07/22 12:45 PM  Result Value Ref Range   Lipase 26 11 - 51 U/L    Comment: Performed at [facility], [address] [place], [place] [address]  Protime-INR     Status: Abnormal   Collection Time: 08/07/22 12:45 PM  Result Value Ref Range   Prothrombin Time 31.6 (H) 11.4 - 15.2 seconds   INR 3.0 (H) 0.8 - 1.2    Comment: (NOTE) INR goal varies based on device and disease states. Performed at [facility], [address] [place], [place] [address]   Brain natriuretic peptide     Status: Abnormal   Collection Time: 08/07/22 12:45 PM  Result Value Ref Range   B Natriuretic Peptide 170.8  (H) 0.0 - 100.0 pg/mL    Comment: Performed at [facility], [address] [place], [place] [address]  Troponin I (High Sensitivity)     Status: None   Collection Time: 08/07/22 12:45 PM  Result Value Ref Range   Troponin I (High Sensitivity) 12 <18 ng/L    Comment: (NOTE) Elevated high sensitivity troponin I (hsTnI) values and significant  changes across serial measurements may suggest ACS but many other  chronic and acute conditions are known to elevate hsTnI results.  Refer to the "Links" section for chest pain algorithms and additional  guidance. Performed at [facility], [address], [place], [place] [address]   Urinalysis, Routine w reflex microscopic -Urine, Clean Catch     Status: Abnormal   Collection Time: 08/07/22  4:28 PM  Result Value Ref Range   Color, Urine YELLOW YELLOW   APPearance CLEAR CLEAR   Specific Gravity, Urine >1.046 (H) 1.005 - 1.030   pH 6.0 5.0 - 8.0   Glucose, UA NEGATIVE NEGATIVE mg/dL   Hgb urine dipstick NEGATIVE NEGATIVE   Bilirubin Urine NEGATIVE NEGATIVE   Ketones, ur NEGATIVE NEGATIVE mg/dL   Protein, ur NEGATIVE NEGATIVE mg/dL   Nitrite NEGATIVE NEGATIVE   Leukocytes,Ua NEGATIVE NEGATIVE    Comment: Performed at [facility], [address] [place], [place] [address]  Protime-INR     Status: Abnormal   Collection Time: 08/08/22 12:29 AM  Result Value Ref Range  Prothrombin Time 35.1 (H) 11.4 - 15.2 seconds   INR 3.5 (H) 0.8 - 1.2    Comment: (NOTE) INR goal varies based on device and disease states. Performed at [facility], [address] [place], [place] [address]   CBC     Status: Abnormal   Collection Time: 08/08/22 12:29 AM  Result Value Ref Range   WBC 11.0 (H) 4.0 - 10.5 K/uL   RBC 4.08 (L) 4.22 - 5.81 MIL/uL   Hemoglobin 12.0 (L) 13.0 - 17.0 g/dL   HCT 60.4 (L) 54.0 - 98.1 %   MCV 91.4 80.0 - 100.0 fL   MCH 29.4 26.0 - 34.0 pg   MCHC 32.2 30.0 - 36.0 g/dL   RDW 19.1 47.8 - 29.5 %    Platelets 91 (L) 150 - 400 K/uL    Comment: Immature Platelet Fraction may be clinically indicated, consider ordering this additional test [id number] REPEATED TO VERIFY    nRBC 0.0 0.0 - 0.2 %    Comment: Performed at [facility], [address], [place], [place] [address]  Comprehensive metabolic panel     Status: Abnormal   Collection Time: 08/08/22 12:29 AM  Result Value Ref Range   Sodium 135 135 - 145 mmol/L   Potassium 4.3 3.5 - 5.1 mmol/L   Chloride 100 98 - 111 mmol/L   CO2 25 22 - 32 mmol/L   Glucose, Bld 135 (H) 70 - 99 mg/dL    Comment: Glucose reference range applies only to samples taken after fasting for at least 8 hours.   BUN 25 (H) 8 - 23 mg/dL   Creatinine, Ser 6.29 0.61 - 1.24 mg/dL   Calcium 8.3 (L) 8.9 - 10.3 mg/dL   Total Protein 5.7 (L) 6.5 - 8.1 g/dL   Albumin 2.7 (L) 3.5 - 5.0 g/dL   AST 85 (H) 15 - 41 U/L   ALT 55 (H) 0 - 44 U/L   Alkaline Phosphatase 123 38 - 126 U/L   Total Bilirubin 1.0 0.3 - 1.2 mg/dL   GFR, Estimated >52 >84 mL/min    Comment: (NOTE) Calculated using the CKD-EPI Creatinine Equation (2021)    Anion gap 10 5 - 15    Comment: Performed at [facility], [address] [place], [place] [address]   CT ABDOMEN PELVIS W CONTRAST  Result Date: 08/07/2022 CLINICAL DATA:  Right upper quadrant abdominal pain * Tracking Code: BO * EXAM: CT ABDOMEN AND PELVIS WITH CONTRAST TECHNIQUE: Multidetector CT imaging of the abdomen and pelvis was performed using the standard protocol following bolus administration of intravenous contrast. RADIATION DOSE REDUCTION: This exam was performed according to the departmental dose-optimization program which includes automated exposure control, adjustment of the mA and/or kV according to patient size and/or use of iterative reconstruction technique. CONTRAST:  75mL OMNIPAQUE IOHEXOL 350 MG/ML SOLN COMPARISON:  04/01/2012 FINDINGS: Lower chest: Mild cardiomegaly.  Right infrahilar clips are noted. New  small pulmonary nodules are present in both lung bases. In the right lower lobe an index nodule measures 1.0 by 0.7 cm on image 19 series 4. In the lingula a nodule measures 6 by 5 mm on image 3 series 4. Multiple additional nodules are present. Hepatobiliary: Suspected portal vein thrombosis anteriorly in the right hepatic lobe (segments 5 and 8) associated with a heterogeneous 14.4 by 9.6 cm mass inferiorly in the right hepatic lobe. Some of this measurement may reflect hypoenhancing but non malignant hepatic parenchymal tissue related to  the portal vein thrombosis, but most of this demonstrates heterogeneous enhancement highly suspicious for malignancy. There is thickening and irregularity within the gallbladder suspicious for a mass inside the gallbladder lumen, image 55 series 6. Gallbladder adenocarcinoma is not excluded. Hepatic lesion could alternatively be related to hepatocellular carcinoma given the portal vein thrombosis. Speckled hypodensities are observed in segment 8 of the liver and may be related to portal vein thrombosis or additional small tumors. Pancreas: Unremarkable Spleen: Speckled calcifications along the lateral margin of the spleen. Otherwise unremarkable. Adrenals/Urinary Tract: Both adrenal glands appear normal. Benign bilateral renal cysts are observed. No further imaging workup of these lesions is indicated. 2 mm right mid kidney nonobstructive renal calculus. Two right renal arteries, the more caudad of which extends from the vicinity of the bifurcation to the lower pole. There is scarring of the left kidney lower pole. Stomach/Bowel: Sigmoid colon diverticulosis. Vascular/Lymphatic: Atherosclerosis is present, including aortoiliac atherosclerotic disease. Heart and soft plaque causing stenosis of the proximal SMA, without overt occlusion identified. Peripancreatic/porta hepatis lymph node enlarged at 1.3 cm in short axis on image 26 series 3. Other porta hepatis lymph nodes are upper  normal in size. Reproductive: Prominent prostatomegaly with the prostate gland measuring 6.2 by 6.3 by 7.5 cm (volume = 150 cm^3). Other: Trace ascites in the right paracolic gutter. Musculoskeletal: Probable hemangioma eccentric to the left in the L2 vertebral body. IMPRESSION: 1. 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 2. Large mass in the inferior right hepatic lobe with associated portal vein thrombosis in segments 5 and 8. This is highly suspicious for malignancy, possibly from metastatic gallbladder adenocarcinoma or hepatocellular carcinoma. 3. New multiple small pulmonary nodules in the lung bases, concerning for metastatic disease. 4. Mildly enlarged peripancreatic/porta hepatis lymph node. 5. Trace ascites in the right paracolic gutter. 6. Prominent prostatomegaly. 7. Aortic atherosclerosis. Aortic Atherosclerosis (ICD10-I70.0). Electronically Signed   By: Gaylyn Rong M.D.   On: 08/07/2022 15:51   DG Chest Port 1 View  Result Date: 08/07/2022 CLINICAL DATA:  Fatigue EXAM: PORTABLE CHEST 1 VIEW COMPARISON:  X-ray 08/05/2018 and older FINDINGS: Underinflation. Film is slightly rotated to the left. There is some linear opacity at the bases likely atelectasis. New from prior. No pneumothorax, effusion or edema. Overlapping cardiac leads. Stable cardiopericardial silhouette when adjusting for level of inflation. Surgical changes overlie the lower right chest. IMPRESSION: Underinflation with new presumed basilar atelectasis. Rotated radiograph Electronically Signed   By: Karen Kays M.D.   On: 08/07/2022 13:23      Assessment/Plan This is an 89 yo male presenting with progressive RUQ abdominal pain and found to have a large right liver mass. I personally reviewed his labs, imaging and notes. There is a large, irregular hypodense mass in the right liver which on my review appears to abut the main portal vein  bifurcation, with associated thrombosis of the right anterior portal vein. There are possible satellite lesions as well as periportal adenopathy and lung nodules, suspicious for metastatic disease. Unclear if this is extension of a primary gallbladder malignancy or an intrahepatic cholangiocarcinoma. HCC is also a possibility. However given portal vein involvement and question of metastatic disease, this is not resectable. - Tumor markers pending - Liver MRI ordered for further evaluation of liver mass. Patient will also likely need a biopsy for tissue diagnosis. - Will need medical oncology referral to discuss systemic treatment options. - If patient is otherwise stable for  discharge, the remainder of his workup may be done as an outpatient.  Sophronia Simas, MD [facility] Surgery General, Hepatobiliary and Pancreatic Surgery 08/08/22 4:10 AM

## 2022-08-08 NOTE — Progress Notes (Signed)
ANTICOAGULATION CONSULT NOTE  Pharmacy Consult to transition warfarin to Heparin Indication:  portal vein thrombosis  No Known Allergies  Patient Measurements: Height: 5\' 10"  (177.8 cm) Weight: 89.8 kg (197 lb 15.6 oz) IBW/kg (Calculated) : 73 Heparin Dosing Weight: 89.8 kg  Vital Signs: Temp: 98.2 F (36.8 C) (06/15 1144) Temp Source: Oral (06/15 1144) BP: 133/81 (06/15 1144) Pulse Rate: 98 (06/15 1144)  Labs: Recent Labs    08/07/22 1245 08/08/22 0029  HGB 12.5* 12.0*  HCT 38.7* 37.3*  PLT 102* 91*  LABPROT 31.6* 35.1*  INR 3.0* 3.5*  CREATININE 1.05 0.95  TROPONINIHS 12  --      Estimated Creatinine Clearance: 61.8 mL/min (by C-G formula based on SCr of 0.95 mg/dL).   Medical History: Past Medical History:  Diagnosis Date   CAD (coronary artery disease)    s/p MI 1976, normal myoview 7/11   Depressive disorder, not elsewhere classified    Esophageal reflux    Headache    migraines occ., none in months.   History of BPH    benign prostatic hypertrophy   Labyrinthitis, unspecified    Malignant neoplasm middle lobe, bronchus or lung    right lung. Dx 07/27/08, sp/p resection 7/10.    Osteoarthrosis, unspecified whether generalized or localized, lower leg    knee   Other and unspecified hyperlipidemia    Paroxysmal atrial fibrillation (HCC)    postoperative after lung surgery   Renal cyst, right    being followed   Stroke [facility])    TIA March 2000     no side effects now   Unspecified essential hypertension     Medications:  Medications Prior to Admission  Medication Sig Dispense Refill Last Dose   amoxicillin-clavulanate (AUGMENTIN) 875-125 MG tablet Take 1 tablet by mouth 2 (two) times daily. 20 tablet 0 08/07/2022   cholecalciferol (VITAMIN D) 1000 UNITS tablet Take 1,000 Units by mouth daily. Take 1 tablet at 6 pm.   08/06/2022   finasteride (PROSCAR) 5 MG tablet Take 1 tablet (5 mg total) by mouth daily. (Patient taking differently: Take 5 mg by  mouth daily. Take 1 tablet at 6 pm) 90 tablet 3 08/06/2022   HYDROcodone-acetaminophen (NORCO/VICODIN) 5-325 MG tablet Take 1 tablet by mouth every 6 (six) hours as needed for severe pain. 20 tablet 0 08/07/2022   multivitamin-lutein (OCUVITE-LUTEIN) CAPS Take 1 capsule by mouth daily.   08/07/2022   Polyvinyl Alcohol-Povidone (REFRESH OP) Apply 2 drops to eye 2 (two) times daily.   08/07/2022   simvastatin (ZOCOR) 20 MG tablet Take 1 tablet (20 mg total) by mouth daily. (Patient taking differently: Take 20 mg by mouth daily. Take at 6pm) 90 tablet 3 08/06/2022   terazosin (HYTRIN) 5 MG capsule Take 1 capsule (5 mg total) by mouth daily. (Patient taking differently: Take 5 mg by mouth every other day. Take every other night at 6 pm.) 90 capsule 3 08/06/2022   warfarin (COUMADIN) 5 MG tablet Take 1 tablet daily or Take as directed by anticoagulation clinic. (Patient taking differently: Take 2.5 mg by mouth daily at 6 PM. Take 1 tablet by mouth on Monday at 6 pm, and one-half pill on other days at 6pm *Subject to change*) 90 tablet 3 08/06/2022 at 18:00   White Petrolatum-Mineral Oil (GENTEAL TEARS NIGHT-TIME OP) Apply 1 Dose to eye at bedtime.      ondansetron (ZOFRAN) 4 MG tablet Take 1 tablet (4 mg total) by mouth every 8 (eight) hours as needed for  nausea or vomiting. 20 tablet 0 unknown    Scheduled:   finasteride  5 mg Oral Daily   simvastatin  20 mg Oral Daily   terazosin  5 mg Oral QODAY   Infusions:  PRN: HYDROcodone-acetaminophen, HYDROmorphone (DILAUDID) injection, ondansetron (ZOFRAN) IV  Assessment: 89 yom with a history of CAD, AF on warfarin, stroke. Patient is presenting with abdominal pain. Heparin per pharmacy consult placed for  portal vein thrombosis .  Patient is on warfarin prior to arrival. Home dose is 2.5mg  daily per last anticoag note Med rec states taking 5mg  on Monday and 2.5mg  all other days per patient. Last dose 6/13 pm.   Hgb 12; plt 91 INR is up slightly from previous  value at 3.5. Will continue to monitor for ability to start heparin.  Goal of Therapy:  Heparin level 0.3-0.7 units/ml Monitor platelets by anticoagulation protocol: Yes   Plan:  Per discussion with ED provider, plan to start heparin once INR 2. Could consider heparin start sooner if patient condition worsens. Monitor INR Continue to monitor H&H and platelets F/u transition back to warfarin   Thank you for allowing pharmacy to be a part of this patient's care.   Signe Colt, PharmD 08/08/2022 3:15 PM  **Pharmacist phone directory can be found on [email or web address] listed under [facility]**

## 2022-08-08 NOTE — Progress Notes (Signed)
[facility]  PROGRESS NOTE  Bernard [name] ZOX:[id number] DOB: 10-Dec-1932 DOA: 08/07/2022 PCP: Tresa Garter, MD   Brief HPI:   89 year old male with history of right middle lobe lung cancer s/p lobectomy, hypertension, chronic atrial fibrillation on Coumadin, BPH presented with worsening right upper quadrant abdominal pain.  Symptoms started 2 to 3 weeks ago with intermittent right upper quadrant abdominal pain, last about 5 pounds since January.  No fever chills or nausea.  Went to see PCP, who ordered abdominal x-ray, showed no acute finding and scheduled ultrasound next week.  PCP suspected cholecystitis and started on Augmentin.  However patient abdominal pain became intolerable so he came to hospital. In the ED CT abdomen/pelvis showed irregular masslike wall thickening of the gallbladder lumen and large mass in the inferior right hepatic lobe with associated portal vein thrombosis suspicious for metastatic gallbladder adenocarcinoma or hepatocellular carcinoma.  General surgery was consulted    Assessment/Plan:   Liver mass/portal vein thrombosis/cholangiocarcinoma -Likely from hypercoagulable state with hepatic/gallbladder mass -Coumadin was held yesterday, patient started on heparin -General surgery was consulted; unclear if this is primary gallbladder malignancy versus intrahepatic cholangiocarcinoma -Given portal vein involvement and question of metastatic disease, this is not resectable -Tumor markers ordered; follow liver MRI -Discussed with oncology, Dr. Parke Poisson, she will see patient today  Transaminitis -Mild elevation of LFTs -Secondary to above -Follow liver MRI  History of atrial fibrillation -Heart rate is controlled -Coumadin switched to heparin drip  Hypertension -Blood pressure is controlled -Not on medications at home    Medications     finasteride  5 mg Oral Daily   simvastatin  20 mg Oral Daily   terazosin  5 mg Oral QODAY     Data  Reviewed:   CBG:  No results for input(s): "GLUCAP" in the last 168 hours.  SpO2: 95 %    Vitals:   08/07/22 2043 08/08/22 0435 08/08/22 0851 08/08/22 1144  BP: 135/76 101/60 131/80 133/81  Pulse: 89 87 99 98  Resp: 20  16 16   Temp: 98 F (36.7 C) 98 F (36.7 C) 98.7 F (37.1 C) 98.2 F (36.8 C)  TempSrc: Oral  Oral Oral  SpO2: 97% 94% 93% 95%  Weight:      Height:          Data Reviewed:  Basic Metabolic Panel: Recent Labs  Lab 08/05/22 1226 08/07/22 1245 08/08/22 0029  NA 135 135 135  K 4.2 3.9 4.3  CL 98 101 100  CO2 29 24 25   GLUCOSE 134* 161* 135*  BUN 26* 30* 25*  CREATININE 1.08 1.05 0.95  CALCIUM 9.2 8.4* 8.3*    CBC: Recent Labs  Lab 08/05/22 1226 08/07/22 1245 08/08/22 0029  WBC 11.7* 10.5 11.0*  NEUTROABS 9.6* 8.5*  --   HGB 13.4 12.5* 12.0*  HCT 41.4 38.7* 37.3*  MCV 91.6 93.7 91.4  PLT 123.0* 102* 91*    LFT Recent Labs  Lab 08/05/22 1226 08/07/22 1245 08/08/22 0029  AST 63* 82* 85*  ALT 47 53* 55*  ALKPHOS 131* 129* 123  BILITOT 1.2 0.9 1.0  PROT 6.9 5.9* 5.7*  ALBUMIN 3.8 2.9* 2.7*     Antibiotics: Anti-infectives (From admission, onward)    None        DVT prophylaxis: Heparin  Code Status: DNR  Family Communication: Discussed with patient's son and daughter-in-law at bedside   CONSULTS General surgery   Subjective   Denies pain, nausea or vomiting   Objective  Physical Examination:   General-appears in no acute distress Heart-S1-S2, regular, no murmur auscultated Lungs-clear to auscultation bilaterally, no wheezing or crackles auscultated Abdomen-soft, nontender, no organomegaly Extremities-no edema in the lower extremities Neuro-alert, oriented x3, no focal deficit noted  Status is: Inpatient:             Meredeth Ide   [facility] If 7PM-7AM, please contact night-coverage at [email or web address], Office  [phone]   08/08/2022, 1:22 PM  LOS: 1 day

## 2022-08-08 NOTE — Consult Note (Signed)
[facility]  [phone]   HEMATOLOGY ONCOLOGY INPATIENT CONSULTATION   Bernard [name]  DOB: 1932/09/14  MR#: [id number]  CSN#: [id number]    Requesting Physician: [facility]  Patient Care Team: Tresa Garter, MD as PCP - General (Internal Medicine) Regan Lemming, MD as PCP - Electrophysiology (Cardiology) Nelson Chimes, MD (Ophthalmology) Ollen Gross, MD (Orthopedic Surgery) Si Gaul, MD as Consulting Physician (Oncology) Hillis Range, MD (Inactive) as Consulting Physician (Cardiology) Heloise Purpura, MD as Consulting Physician (Urology)  Reason for consult: liver/gallbladder masses, concerning for malignancy  History of present illness:   Patient is a 89 year old gentleman, with past medical history of right lung cancer 14 years ago, hypertension, chronic atrial fibrillation on Coumadin, BPH, presented with worsening right upper quadrant abdominal pain.  The symptoms started about 2 to 3 weeks ago, intermittent, got much worse in the past week.  He was seen by primary care physician, and presented to emergency room due to worsening abdominal pain.  CT abdomen pelvis showed irregular masslike wall thickening of the gallbladder, and large mass in the right lobe of liver, with associated portal vein thrombosis.  Liver MRI showed multiple hypervascular masses in the right and the left hepatic lobe, largest measuring 11 cm, which is contiguous with the gallbladder, and several enhancing soft tissue density in the edges of the gallbladder fundus.  Image is concerning for primary gallbladder carcinoma.  He also has multiple small liver metastasis in both lobes.  And mild lymphadenopathy in the porta hepatis.  Patient was evaluated by general surgeon Dr. Freida Busman, who does not think his disease is resectable.  He has not had a biopsy yet.  Patient lives alone independently, still does all the housework including yard work.  However he has  been very fatigued in the past few weeks, is not able to housework anymore.  Denies any significant nausea, he has lost 6 pounds in the past few weeks.  His appetite is normal, no change of bowel habit.  MEDICAL HISTORY:  Past Medical History:  Diagnosis Date   CAD (coronary artery disease)    s/p MI 1976, normal myoview 7/11   Depressive disorder, not elsewhere classified    Esophageal reflux    Headache    migraines occ., none in months.   History of BPH    benign prostatic hypertrophy   Labyrinthitis, unspecified    Malignant neoplasm middle lobe, bronchus or lung    right lung. Dx 07/27/08, sp/p resection 7/10.    Osteoarthrosis, unspecified whether generalized or localized, lower leg    knee   Other and unspecified hyperlipidemia    Paroxysmal atrial fibrillation (HCC)    postoperative after lung surgery   Renal cyst, right    being followed   Stroke [facility])    TIA March 2000     no side effects now   Unspecified essential hypertension     SURGICAL HISTORY: Past Surgical History:  Procedure Laterality Date   anthroscopic surgery  '10   right knee \   BUNIONECTOMY  '89   left foot   CATARACT EXTRACTION, BILATERAL     ESOPHAGOGASTRODUODENOSCOPY  05/14/04   EYE SURGERY     both eyes - 2011 ([place])   INGUINAL HERNIA REPAIR  '71   left   right TKR  8/11   TOTAL KNEE ARTHROPLASTY Left 09/24/2014   Procedure: LEFT TOTAL KNEE ARTHROPLASTY;  Surgeon: Ollen Gross, MD;  Location: [facility];  Service: Orthopedics;  Laterality: Left;  VAT-RML lobectomy      SOCIAL HISTORY: Social History   Socioeconomic History   Marital status: Married    Spouse name: Not on file   Number of children: Not on file   Years of education: Not on file   Highest education level: Not on file  Occupational History   Occupation: railroad Engineer, drilling: RETIRED    Comment: retired  Tobacco Use   Smoking status: Never   Smokeless tobacco: Never  Vaping Use   Vaping Use: Never used   Substance and Sexual Activity   Alcohol use: No   Drug use: No   Sexual activity: Yes    Partners: Female  Other Topics Concern   Not on file  Social History Narrative   HSG. Married '70. 1 son '75. Lives with wife independently in [place]. 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 [employer]. 09/10/09.    Social Determinants of Health   Financial Resource Strain: Not on file  Food Insecurity: No Food Insecurity (08/08/2022)   Hunger Vital Sign    Worried About Running Out of Food in the Last Year: Never true    Ran Out of Food in the Last Year: Never true  Transportation Needs: No Transportation Needs (08/08/2022)   PRAPARE - Administrator, Civil Service (Medical): No    Lack of Transportation (Non-Medical): No  Physical Activity: Not on file  Stress: Not on file  Social Connections: Not on file  Intimate Partner Violence: Not At Risk (08/08/2022)   Humiliation, Afraid, Rape, and Kick questionnaire    Fear of Current or Ex-Partner: No    Emotionally Abused: No    Physically Abused: No    Sexually Abused: No    FAMILY HISTORY: Family History  Problem Relation Age of Onset   Diabetes insipidus Unknown        2nd kin    Coronary artery disease Neg Hx    Colon cancer Neg Hx    Prostate cancer Neg Hx    Heart attack Neg Hx     ALLERGIES:  has No Known Allergies.  MEDICATIONS:  Current Facility-Administered Medications  Medication Dose Route Frequency Provider Last Rate Last Admin   finasteride (PROSCAR) tablet 5 mg  5 mg Oral Daily Mikey College T, MD   5 mg at 08/07/22 2103   HYDROcodone-acetaminophen (NORCO/VICODIN) 5-325 MG per tablet 1 tablet  1 tablet Oral Q6H PRN Emeline General, MD   1 tablet at 08/08/22 1610   HYDROmorphone (DILAUDID) injection 0.5 mg  0.5 mg Intravenous Q4H PRN Emeline General, MD       ondansetron [facility]) injection 4 mg  4 mg Intravenous Q6H PRN Mikey College T, MD        simvastatin (ZOCOR) tablet 20 mg  20 mg Oral Daily Mikey College T, MD   20 mg at 08/07/22 2103   terazosin (HYTRIN) capsule 5 mg  5 mg Oral Venda Rodes, MD   5 mg at 08/07/22 2204    REVIEW OF SYSTEMS:   Constitutional: Denies fevers, chills or abnormal night sweats Eyes: Denies blurriness of vision, double vision or watery eyes Ears, nose, mouth, throat, and face: Denies mucositis or sore throat Respiratory: Denies cough, dyspnea or wheezes Cardiovascular: Denies palpitation, chest discomfort or lower extremity swelling Gastrointestinal:  Denies nausea, heartburn or change in bowel habits Skin: Denies abnormal skin rashes Lymphatics: Denies new lymphadenopathy or easy bruising  Neurological:Denies numbness, tingling or new weaknesses Behavioral/Psych: Mood is stable, no new changes  All other systems were reviewed with the patient and are negative.  PHYSICAL EXAMINATION: ECOG PERFORMANCE STATUS: 2 - Symptomatic, <50% confined to bed  Vitals:   08/08/22 1144 08/08/22 1612  BP: 133/81 121/72  Pulse: 98 (!) 103  Resp: 16 16  Temp: 98.2 F (36.8 C) 98.9 F (37.2 C)  SpO2: 95% 94%   Filed Weights   08/07/22 1216 08/07/22 1804  Weight: 198 lb (89.8 kg) 197 lb 15.6 oz (89.8 kg)    GENERAL:alert, no distress and comfortable SKIN: skin color, texture, turgor are normal, no rashes or significant lesions EYES: normal, conjunctiva are pink and non-injected, sclera clear OROPHARYNX:no exudate, no erythema and lips, buccal mucosa, and tongue normal  NECK: supple, thyroid normal size, non-tender, without nodularity LYMPH:  no palpable lymphadenopathy in the cervical, axillary or inguinal LUNGS: clear to auscultation and percussion with normal breathing effort HEART: regular rate & rhythm and no murmurs and no lower extremity edema ABDOMEN:abdomen soft, non-tender and normal bowel sounds Musculoskeletal:no cyanosis of digits and no clubbing  PSYCH: alert & oriented x 3 with fluent  speech NEURO: no focal motor/sensory deficits  LABORATORY DATA:  I have reviewed the data as listed Lab Results  Component Value Date   WBC 11.0 (H) 08/08/2022   HGB 12.0 (L) 08/08/2022   HCT 37.3 (L) 08/08/2022   MCV 91.4 08/08/2022   PLT 91 (L) 08/08/2022   Recent Labs    08/05/22 1226 08/07/22 1245 08/08/22 0029  NA 135 135 135  K 4.2 3.9 4.3  CL 98 101 100  CO2 29 24 25   GLUCOSE 134* 161* 135*  BUN 26* 30* 25*  CREATININE 1.08 1.05 0.95  CALCIUM 9.2 8.4* 8.3*  GFRNONAA  --  >60 >60  PROT 6.9 5.9* 5.7*  ALBUMIN 3.8 2.9* 2.7*  AST 63* 82* 85*  ALT 47 53* 55*  ALKPHOS 131* 129* 123  BILITOT 1.2 0.9 1.0    RADIOGRAPHIC STUDIES: I have personally reviewed the radiological images as listed and agreed with the findings in the report. MR LIVER W WO CONTRAST  Result Date: 08/08/2022 CLINICAL DATA:  Right upper quadrant pain. Hepatic mass recent CT. Lung carcinoma. EXAM: MRI ABDOMEN WITHOUT AND WITH CONTRAST TECHNIQUE: Multiplanar multisequence MR imaging of the abdomen was performed both before and after the administration of intravenous contrast. CONTRAST:  9mL GADAVIST GADOBUTROL 1 MMOL/ML IV SOLN COMPARISON:  CT on 08/07/2022 FINDINGS: Lower chest: No acute findings. Hepatobiliary: Large hypovascular mass is seen in the central liver involving the right and left lobes, measuring 11.0 x 8.8 cm on image 53/18. This is contiguous with the gallbladder, and several enhancing nodular soft tissue densities seen in the adjacent gallbladder fundus. Numerous gallstones are also noted, however there is no evidence of acute cholecystitis or biliary ductal dilatation. In addition, there hypovascular mass in the posterior right hepatic lobe measuring 7.3 x 6.5 cm, and multiple other smaller hypovascular lesions in the right and left lobes measuring up to 1 cm. Pancreas:  No mass or inflammatory changes. Spleen:  Within normal limits in size and appearance. Adrenals/Urinary Tract: No suspicious  masses identified. Benign appearing renal cysts are noted (No followup imaging is recommended). No evidence of hydronephrosis. Stomach/Bowel: Diverticulosis of descending colon noted. Otherwise unremarkable. Vascular/Lymphatic: Mild lymphadenopathy is seen in the porta hepatis measuring up to 1.3 cm, suspicious for metastatic disease. No other sites of lymphadenopathy identified. Portal vein thrombosis  is seen in the anterior segment of the right hepatic lobe, however this shows no definite contrast enhancement. Other:  None. Musculoskeletal:  No suspicious bone lesions identified. IMPRESSION: Multiple hypovascular masses in the right and left hepatic lobes, largest measuring 11 cm. This dominant mass is contiguous with the gallbladder, and several enhancing nodular soft tissue densities are present in the adjacent gallbladder fundus. These findings are most consistent with liver metastases, and could be due to lung carcinoma or primary gallbladder carcinoma. Mild lymphadenopathy in the porta hepatis, suspicious for metastatic disease. Segmental portal vein thrombosis in the anterior segment of the right hepatic lobe, without enhancement suggesting bland thrombus rather than tumor thrombus. Cholelithiasis. No radiographic evidence of acute cholecystitis or biliary obstruction. Left colonic diverticulosis. Electronically Signed   By: Danae Orleans M.D.   On: 08/08/2022 09:57   CT ABDOMEN PELVIS W CONTRAST  Result Date: 08/07/2022 CLINICAL DATA:  Right upper quadrant abdominal pain * Tracking Code: BO * EXAM: CT ABDOMEN AND PELVIS WITH CONTRAST TECHNIQUE: Multidetector CT imaging of the abdomen and pelvis was performed using the standard protocol following bolus administration of intravenous contrast. RADIATION DOSE REDUCTION: This exam was performed according to the departmental dose-optimization program which includes automated exposure control, adjustment of the mA and/or kV according to patient size and/or use  of iterative reconstruction technique. CONTRAST:  75mL OMNIPAQUE IOHEXOL 350 MG/ML SOLN COMPARISON:  04/01/2012 FINDINGS: Lower chest: Mild cardiomegaly.  Right infrahilar clips are noted. New small pulmonary nodules are present in both lung bases. In the right lower lobe an index nodule measures 1.0 by 0.7 cm on image 19 series 4. In the lingula a nodule measures 6 by 5 mm on image 3 series 4. Multiple additional nodules are present. Hepatobiliary: Suspected portal vein thrombosis anteriorly in the right hepatic lobe (segments 5 and 8) associated with a heterogeneous 14.4 by 9.6 cm mass inferiorly in the right hepatic lobe. Some of this measurement may reflect hypoenhancing but non malignant hepatic parenchymal tissue related to the portal vein thrombosis, but most of this demonstrates heterogeneous enhancement highly suspicious for malignancy. There is thickening and irregularity within the gallbladder suspicious for a mass inside the gallbladder lumen, image 55 series 6. Gallbladder adenocarcinoma is not excluded. Hepatic lesion could alternatively be related to hepatocellular carcinoma given the portal vein thrombosis. Speckled hypodensities are observed in segment 8 of the liver and may be related to portal vein thrombosis or additional small tumors. Pancreas: Unremarkable Spleen: Speckled calcifications along the lateral margin of the spleen. Otherwise unremarkable. Adrenals/Urinary Tract: Both adrenal glands appear normal. Benign bilateral renal cysts are observed. No further imaging workup of these lesions is indicated. 2 mm right mid kidney nonobstructive renal calculus. Two right renal arteries, the more caudad of which extends from the vicinity of the bifurcation to the lower pole. There is scarring of the left kidney lower pole. Stomach/Bowel: Sigmoid colon diverticulosis. Vascular/Lymphatic: Atherosclerosis is present, including aortoiliac atherosclerotic disease. Heart and soft plaque causing stenosis  of the proximal SMA, without overt occlusion identified. Peripancreatic/porta hepatis lymph node enlarged at 1.3 cm in short axis on image 26 series 3. Other porta hepatis lymph nodes are upper normal in size. Reproductive: Prominent prostatomegaly with the prostate gland measuring 6.2 by 6.3 by 7.5 cm (volume = 150 cm^3). Other: Trace ascites in the right paracolic gutter. Musculoskeletal: Probable hemangioma eccentric to the left in the L2 vertebral body. IMPRESSION: 1. Irregular masslike wall thickening in the gallbladder lumen as on image 36 series  6, concerning for gallbladder adenocarcinoma. 2. Large mass in the inferior right hepatic lobe with associated portal vein thrombosis in segments 5 and 8. This is highly suspicious for malignancy, possibly from metastatic gallbladder adenocarcinoma or hepatocellular carcinoma. 3. New multiple small pulmonary nodules in the lung bases, concerning for metastatic disease. 4. Mildly enlarged peripancreatic/porta hepatis lymph node. 5. Trace ascites in the right paracolic gutter. 6. Prominent prostatomegaly. 7. Aortic atherosclerosis. Aortic Atherosclerosis (ICD10-I70.0). Electronically Signed   By: Gaylyn Rong M.D.   On: 08/07/2022 15:51   DG Chest 2 View  Result Date: 08/07/2022 CLINICAL DATA:  Lung cancer follow-up EXAM: CHEST - 2 VIEW COMPARISON:  X-ray 08/06/2021 and older FINDINGS: Stable surgical changes along the lower medial right hemithorax. No consolidation, pneumothorax or effusion. No edema. Normal cardiopericardial silhouette. Degenerative changes of the spine. IMPRESSION: No acute cardiopulmonary disease. Electronically Signed   By: Karen Kays M.D.   On: 08/07/2022 13:24   DG Chest Port 1 View  Result Date: 08/07/2022 CLINICAL DATA:  Fatigue EXAM: PORTABLE CHEST 1 VIEW COMPARISON:  X-ray 08/05/2018 and older FINDINGS: Underinflation. Film is slightly rotated to the left. There is some linear opacity at the bases likely atelectasis. New from  prior. No pneumothorax, effusion or edema. Overlapping cardiac leads. Stable cardiopericardial silhouette when adjusting for level of inflation. Surgical changes overlie the lower right chest. IMPRESSION: Underinflation with new presumed basilar atelectasis. Rotated radiograph Electronically Signed   By: Karen Kays M.D.   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  Unfortunate this is not resectable disease due to the liver metastasis.  Patient has been seen by general surgeon Dr. Freida Busman. -will order CT chest to complete staging  -We discussed a liver biopsy to get definitive diagnosis, indicated tumor tissue for molecular testing, to see if he is a candidate for targeted therapy -Due to his advanced age, he is not a candidate for cytotoxic chemotherapy.  If immunotherapy or targeted therapy are available, he would be still a candidate. -After lengthy discussion with the patient and her son about above, patient is not sure if he wants to proceed with liver biopsy.  He wants to think about it. -will consult IR to review the biopsy procedure, which can help him to decide if he wants to proceed -If he does not proceed biopsy, or if no targeted therapy available based on the molecular testing on tumor biopsy sample, then I will recommend hospice care, I discussed the logistics of hospice and the benefit. -Patient's son asks me to reach out to [facility], to see if  they would offer surgical resection, or if they have clinical trial for him, I will contact [facility].  -if he proceeds with liver biopsy, then I will see him back in office later this week to review the biopsy results. -OK to discharge after biopsy, and switch him back to Coumadin with Lovenox bridging.   All questions were answered. The patient knows to call the clinic with any problems, questions or concerns.      Malachy Mood, MD 08/08/2022 6:34  PM

## 2022-08-08 NOTE — Plan of Care (Signed)
Problem: Education: Goal: Knowledge of General Education information will improve Description: Including pain rating scale, medication(s)/side effects and non-pharmacologic comfort measures 08/08/2022 1743 by Adonis Brook, RN Outcome: Progressing 08/08/2022 1355 by Adonis Brook, RN Reactivated 08/08/2022 1352 by Lanell Matar, Eliot Ford, RN Outcome: Adequate for Discharge   Problem: Health Behavior/Discharge Planning: Goal: Ability to manage health-related needs will improve 08/08/2022 1743 by Adonis Brook, RN Outcome: Progressing 08/08/2022 1355 by Adonis Brook, RN Reactivated 08/08/2022 1352 by Lanell Matar, Eliot Ford, RN Outcome: Adequate for Discharge   Problem: Clinical Measurements: Goal: Ability to maintain clinical measurements within normal limits will improve 08/08/2022 1743 by Adonis Brook, RN Outcome: Progressing 08/08/2022 1355 by Adonis Brook, RN Reactivated 08/08/2022 1352 by Lanell Matar, Eliot Ford, RN Outcome: Adequate for Discharge Goal: Will remain free from infection 08/08/2022 1743 by Adonis Brook, RN Outcome: Progressing 08/08/2022 1355 by Adonis Brook, RN Reactivated 08/08/2022 1352 by Adonis Brook, RN Outcome: Adequate for Discharge Goal: Diagnostic test results will improve 08/08/2022 1743 by Adonis Brook, RN Outcome: Progressing 08/08/2022 1355 by Adonis Brook, RN Reactivated 08/08/2022 1352 by Lanell Matar, Eliot Ford, RN Outcome: Adequate for Discharge Goal: Respiratory complications will improve 08/08/2022 1743 by Adonis Brook, RN Outcome: Progressing 08/08/2022 1355 by Adonis Brook, RN Reactivated 08/08/2022 1352 by Adonis Brook, RN Outcome: Adequate for Discharge Goal: Cardiovascular complication will be avoided 08/08/2022 1743 by Adonis Brook, RN Outcome: Progressing 08/08/2022 1355 by Adonis Brook, RN Reactivated 08/08/2022 1352 by Lanell Matar, Eliot Ford, RN Outcome: Adequate for  Discharge   Problem: Activity: Goal: Risk for activity intolerance will decrease 08/08/2022 1743 by Adonis Brook, RN Outcome: Progressing 08/08/2022 1355 by Lanell Matar, Eliot Ford, RN Reactivated 08/08/2022 1352 by Lanell Matar, Eliot Ford, RN Outcome: Adequate for Discharge   Problem: Nutrition: Goal: Adequate nutrition will be maintained 08/08/2022 1743 by Adonis Brook, RN Outcome: Progressing 08/08/2022 1355 by Adonis Brook, RN Reactivated 08/08/2022 1352 by Adonis Brook, RN Outcome: Adequate for Discharge   Problem: Coping: Goal: Level of anxiety will decrease 08/08/2022 1743 by Adonis Brook, RN Outcome: Progressing 08/08/2022 1355 by Adonis Brook, RN Reactivated 08/08/2022 1352 by Lanell Matar, Eliot Ford, RN Outcome: Adequate for Discharge   Problem: Elimination: Goal: Will not experience complications related to bowel motility 08/08/2022 1743 by Adonis Brook, RN Outcome: Progressing 08/08/2022 1355 by Adonis Brook, RN Reactivated 08/08/2022 1352 by Lanell Matar, Eliot Ford, RN Outcome: Adequate for Discharge Goal: Will not experience complications related to urinary retention 08/08/2022 1743 by Adonis Brook, RN Outcome: Progressing 08/08/2022 1355 by Adonis Brook, RN Reactivated 08/08/2022 1352 by Adonis Brook, RN Outcome: Adequate for Discharge   Problem: Pain Managment: Goal: General experience of comfort will improve 08/08/2022 1743 by Adonis Brook, RN Outcome: Progressing 08/08/2022 1355 by Adonis Brook, RN Reactivated 08/08/2022 1352 by Lanell Matar, Eliot Ford, RN Outcome: Adequate for Discharge   Problem: Safety: Goal: Ability to remain free from injury will improve 08/08/2022 1743 by Adonis Brook, RN Outcome: Progressing 08/08/2022 1355 by Adonis Brook, RN Reactivated 08/08/2022 1352 by Lanell Matar, Eliot Ford, RN Outcome: Adequate for Discharge   Problem: Skin Integrity: Goal: Risk for impaired skin  integrity will  decrease 08/08/2022 1743 by Adonis Brook, RN Outcome: Progressing 08/08/2022 1355 by Adonis Brook, RN Reactivated 08/08/2022 1352 by Lanell Matar, Eliot Ford, RN Outcome: Adequate for Discharge

## 2022-08-09 ENCOUNTER — Inpatient Hospital Stay (HOSPITAL_COMMUNITY): Payer: MEDICARE

## 2022-08-09 DIAGNOSIS — I81 Portal vein thrombosis: Secondary | ICD-10-CM | POA: Diagnosis not present

## 2022-08-09 DIAGNOSIS — K828 Other specified diseases of gallbladder: Secondary | ICD-10-CM | POA: Diagnosis not present

## 2022-08-09 DIAGNOSIS — R109 Unspecified abdominal pain: Secondary | ICD-10-CM | POA: Diagnosis not present

## 2022-08-09 DIAGNOSIS — R160 Hepatomegaly, not elsewhere classified: Secondary | ICD-10-CM

## 2022-08-09 LAB — CBC
HCT: 37.1 % — ABNORMAL LOW (ref 39.0–52.0)
Hemoglobin: 12.1 g/dL — ABNORMAL LOW (ref 13.0–17.0)
MCH: 29.6 pg (ref 26.0–34.0)
MCHC: 32.6 g/dL (ref 30.0–36.0)
MCV: 90.7 fL (ref 80.0–100.0)
Platelets: 116 10*3/uL — ABNORMAL LOW (ref 150–400)
RBC: 4.09 MIL/uL — ABNORMAL LOW (ref 4.22–5.81)
RDW: 13.3 % (ref 11.5–15.5)
WBC: 11.5 10*3/uL — ABNORMAL HIGH (ref 4.0–10.5)
nRBC: 0 % (ref 0.0–0.2)

## 2022-08-09 LAB — COMPREHENSIVE METABOLIC PANEL
ALT: 61 U/L — ABNORMAL HIGH (ref 0–44)
AST: 91 U/L — ABNORMAL HIGH (ref 15–41)
Albumin: 2.6 g/dL — ABNORMAL LOW (ref 3.5–5.0)
Alkaline Phosphatase: 134 U/L — ABNORMAL HIGH (ref 38–126)
Anion gap: 8 (ref 5–15)
BUN: 24 mg/dL — ABNORMAL HIGH (ref 8–23)
CO2: 27 mmol/L (ref 22–32)
Calcium: 8.1 mg/dL — ABNORMAL LOW (ref 8.9–10.3)
Chloride: 98 mmol/L (ref 98–111)
Creatinine, Ser: 0.95 mg/dL (ref 0.61–1.24)
GFR, Estimated: >60 mL/min (ref >60)
Glucose, Bld: 103 mg/dL — ABNORMAL HIGH (ref 70–99)
Potassium: 4 mmol/L (ref 3.5–5.1)
Sodium: 133 mmol/L — ABNORMAL LOW (ref 135–145)
Total Bilirubin: 1.1 mg/dL (ref 0.3–1.2)
Total Protein: 5.6 g/dL — ABNORMAL LOW (ref 6.5–8.1)

## 2022-08-09 LAB — PROTIME-INR
INR: 3 — ABNORMAL HIGH (ref 0.8–1.2)
Prothrombin Time: 31 seconds — ABNORMAL HIGH (ref 11.4–15.2)

## 2022-08-09 MED ORDER — HYDROCODONE-ACETAMINOPHEN 5-325 MG PO TABS: 1.0000 | ORAL_TABLET | Freq: Four times a day (QID) | ORAL | 0 refills | Status: DC | PRN

## 2022-08-09 MED ORDER — SENNOSIDES-DOCUSATE SODIUM 8.6-50 MG PO TABS: 1.0000 | ORAL_TABLET | Freq: Two times a day (BID) | ORAL | 0 refills | Status: DC

## 2022-08-09 NOTE — Discharge Instructions (Signed)
Continue taking Coumadin as before

## 2022-08-09 NOTE — Discharge Summary (Signed)
Physician Discharge Summary   Patient: Bernard [name] MRN: [id number] DOB: 01-Mar-1932  Admit date:     08/07/2022  Discharge date: 08/09/22  Discharge Physician: Meredeth Ide   PCP: Tresa Garter, MD   Recommendations at discharge:   Follow-up oncology, Dr. Parke Poisson in 3 days Continue Coumadin for portal vein thrombosis  Discharge Diagnoses: Principal Problem:   Portal vein thrombosis Active Problems:   Liver mass  Resolved Problems:   * No resolved hospital problems. *  Hospital Course: 89 year old male with history of right middle lobe lung cancer s/p lobectomy, hypertension, chronic atrial fibrillation on Coumadin, BPH presented with worsening right upper quadrant abdominal pain.  Symptoms started 2 to 3 weeks ago with intermittent right upper quadrant abdominal pain, last about 5 pounds since January.  No fever chills or nausea.  Went to see PCP, who ordered abdominal x-ray, showed no acute finding and scheduled ultrasound next week.  PCP suspected cholecystitis and started on Augmentin.  However patient abdominal pain became intolerable so he came to hospital. In the ED CT abdomen/pelvis showed irregular masslike wall thickening of the gallbladder lumen and large mass in the inferior right hepatic lobe with associated portal vein thrombosis suspicious for metastatic gallbladder adenocarcinoma or hepatocellular carcinoma.  General surgery was consulted  Assessment and Plan:  Liver mass/portal vein thrombosis/cholangiocarcinoma -Likely from hypercoagulable state with hepatic/gallbladder mass -Coumadin was held yesterday, patient started on heparin -General surgery was consulted; unclear if this is primary gallbladder malignancy versus intrahepatic cholangiocarcinoma -Given portal vein involvement and question of metastatic disease, this is not resectable -Tumor markers ordered; MRI liver showed multiple hypervascular masses in the right and left hepatic lobes largest  measuring 11 cm.  Finding consistent with liver metastasis due to lung carcinoma or primary gallbladder carcinoma.  Segmental portal vein thrombosis in the anterior segment of the right hepatic lobe without enhancement suggesting began thrombus rather than tumor thrombus. -Patient has refused liver biopsy -He will follow-up with Dr. Mosetta Putt on Thursday to have further blood work for possible targeted immunotherapy and also discussed [facility] recommendation -Will discharge home on Vicodin as needed and Senokot S tablets twice a day   Transaminitis -Mild elevation of LFTs -Secondary to above -Will discontinue atorvastatin   History of atrial fibrillation -Heart rate is controlled -Coumadin switched to heparin drip -INR is 3.0 this morning, he can go back to take Coumadin as he was taking before   Hypertension -Blood pressure is controlled -Not on medications at home   Constipation -Will discharge on Senokot-S tablet twice a day, as long as he is taking Vicodin for pain        Consultants: Oncology Procedures performed:  Disposition: Home Diet recommendation:  Discharge Diet Orders (From admission, onward)     Start     Ordered   08/09/22 0000  Diet - low sodium heart healthy        08/09/22 0954           Regular diet DISCHARGE MEDICATION: Allergies as of 08/09/2022   No Known Allergies      Medication List     STOP taking these medications    amoxicillin-clavulanate 875-125 MG tablet Commonly known as: AUGMENTIN   simvastatin 20 MG tablet Commonly known as: ZOCOR       TAKE these medications    cholecalciferol 1000 units tablet Commonly known as: VITAMIN D Take 1,000 Units by mouth daily. Take 1 tablet at 6 pm.   finasteride 5 MG tablet Commonly  known as: PROSCAR Take 1 tablet (5 mg total) by mouth daily. What changed: additional instructions   GENTEAL TEARS NIGHT-TIME OP Apply 1 Dose to eye at bedtime.   HYDROcodone-acetaminophen 5-325 MG  tablet Commonly known as: NORCO/VICODIN Take 1 tablet by mouth every 6 (six) hours as needed for severe pain.   multivitamin-lutein Caps capsule Take 1 capsule by mouth daily.   ondansetron 4 MG tablet Commonly known as: Zofran Take 1 tablet (4 mg total) by mouth every 8 (eight) hours as needed for nausea or vomiting.   REFRESH OP Apply 2 drops to eye 2 (two) times daily.   senna-docusate 8.6-50 MG tablet Commonly known as: Senokot S Take 1 tablet by mouth 2 (two) times daily. Take for constipattion   terazosin 5 MG capsule Commonly known as: HYTRIN Take 1 capsule (5 mg total) by mouth daily. What changed:  when to take this additional instructions   warfarin 5 MG tablet Commonly known as: COUMADIN Take as directed. If you are unsure how to take this medication, talk to your nurse or doctor. Original instructions: Take 1 tablet daily or Take as directed by anticoagulation clinic. What changed:  how much to take how to take this when to take this additional instructions        Discharge Exam: Filed Weights   08/07/22 1216 08/07/22 1804  Weight: 89.8 kg 89.8 kg   General-appears in no acute distress Heart-S1-S2, regular, no murmur auscultated Lungs-clear to auscultation bilaterally, no wheezing or crackles auscultated Abdomen-soft, nontender, distended no organomegaly Extremities-no edema in the lower extremities Neuro-alert, oriented x3, no focal deficit noted  Condition at discharge: good  The results of significant diagnostics from this hospitalization (including imaging, microbiology, ancillary and laboratory) are listed below for reference.   Imaging Studies: MR LIVER W WO CONTRAST  Result Date: 08/08/2022 CLINICAL DATA:  Right upper quadrant pain. Hepatic mass recent CT. Lung carcinoma. EXAM: MRI ABDOMEN WITHOUT AND WITH CONTRAST TECHNIQUE: Multiplanar multisequence MR imaging of the abdomen was performed both before and after the administration of  intravenous contrast. CONTRAST:  9mL GADAVIST GADOBUTROL 1 MMOL/ML IV SOLN COMPARISON:  CT on 08/07/2022 FINDINGS: Lower chest: No acute findings. Hepatobiliary: Large hypovascular mass is seen in the central liver involving the right and left lobes, measuring 11.0 x 8.8 cm on image 53/18. This is contiguous with the gallbladder, and several enhancing nodular soft tissue densities seen in the adjacent gallbladder fundus. Numerous gallstones are also noted, however there is no evidence of acute cholecystitis or biliary ductal dilatation. In addition, there hypovascular mass in the posterior right hepatic lobe measuring 7.3 x 6.5 cm, and multiple other smaller hypovascular lesions in the right and left lobes measuring up to 1 cm. Pancreas:  No mass or inflammatory changes. Spleen:  Within normal limits in size and appearance. Adrenals/Urinary Tract: No suspicious masses identified. Benign appearing renal cysts are noted (No followup imaging is recommended). No evidence of hydronephrosis. Stomach/Bowel: Diverticulosis of descending colon noted. Otherwise unremarkable. Vascular/Lymphatic: Mild lymphadenopathy is seen in the porta hepatis measuring up to 1.3 cm, suspicious for metastatic disease. No other sites of lymphadenopathy identified. Portal vein thrombosis is seen in the anterior segment of the right hepatic lobe, however this shows no definite contrast enhancement. Other:  None. Musculoskeletal:  No suspicious bone lesions identified. IMPRESSION: Multiple hypovascular masses in the right and left hepatic lobes, largest measuring 11 cm. This dominant mass is contiguous with the gallbladder, and several enhancing nodular soft tissue densities are present  in the adjacent gallbladder fundus. These findings are most consistent with liver metastases, and could be due to lung carcinoma or primary gallbladder carcinoma. Mild lymphadenopathy in the porta hepatis, suspicious for metastatic disease. Segmental portal vein  thrombosis in the anterior segment of the right hepatic lobe, without enhancement suggesting bland thrombus rather than tumor thrombus. Cholelithiasis. No radiographic evidence of acute cholecystitis or biliary obstruction. Left colonic diverticulosis. Electronically Signed   By: Danae Orleans M.D.   On: 08/08/2022 09:57   CT ABDOMEN PELVIS W CONTRAST  Result Date: 08/07/2022 CLINICAL DATA:  Right upper quadrant abdominal pain * Tracking Code: BO * EXAM: CT ABDOMEN AND PELVIS WITH CONTRAST TECHNIQUE: Multidetector CT imaging of the abdomen and pelvis was performed using the standard protocol following bolus administration of intravenous contrast. RADIATION DOSE REDUCTION: This exam was performed according to the departmental dose-optimization program which includes automated exposure control, adjustment of the mA and/or kV according to patient size and/or use of iterative reconstruction technique. CONTRAST:  75mL OMNIPAQUE IOHEXOL 350 MG/ML SOLN COMPARISON:  04/01/2012 FINDINGS: Lower chest: Mild cardiomegaly.  Right infrahilar clips are noted. New small pulmonary nodules are present in both lung bases. In the right lower lobe an index nodule measures 1.0 by 0.7 cm on image 19 series 4. In the lingula a nodule measures 6 by 5 mm on image 3 series 4. Multiple additional nodules are present. Hepatobiliary: Suspected portal vein thrombosis anteriorly in the right hepatic lobe (segments 5 and 8) associated with a heterogeneous 14.4 by 9.6 cm mass inferiorly in the right hepatic lobe. Some of this measurement may reflect hypoenhancing but non malignant hepatic parenchymal tissue related to the portal vein thrombosis, but most of this demonstrates heterogeneous enhancement highly suspicious for malignancy. There is thickening and irregularity within the gallbladder suspicious for a mass inside the gallbladder lumen, image 55 series 6. Gallbladder adenocarcinoma is not excluded. Hepatic lesion could alternatively be  related to hepatocellular carcinoma given the portal vein thrombosis. Speckled hypodensities are observed in segment 8 of the liver and may be related to portal vein thrombosis or additional small tumors. Pancreas: Unremarkable Spleen: Speckled calcifications along the lateral margin of the spleen. Otherwise unremarkable. Adrenals/Urinary Tract: Both adrenal glands appear normal. Benign bilateral renal cysts are observed. No further imaging workup of these lesions is indicated. 2 mm right mid kidney nonobstructive renal calculus. Two right renal arteries, the more caudad of which extends from the vicinity of the bifurcation to the lower pole. There is scarring of the left kidney lower pole. Stomach/Bowel: Sigmoid colon diverticulosis. Vascular/Lymphatic: Atherosclerosis is present, including aortoiliac atherosclerotic disease. Heart and soft plaque causing stenosis of the proximal SMA, without overt occlusion identified. Peripancreatic/porta hepatis lymph node enlarged at 1.3 cm in short axis on image 26 series 3. Other porta hepatis lymph nodes are upper normal in size. Reproductive: Prominent prostatomegaly with the prostate gland measuring 6.2 by 6.3 by 7.5 cm (volume = 150 cm^3). Other: Trace ascites in the right paracolic gutter. Musculoskeletal: Probable hemangioma eccentric to the left in the L2 vertebral body. IMPRESSION: 1. 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 2. Large mass in the inferior right hepatic lobe with associated portal vein thrombosis in segments 5 and 8. This is highly suspicious for malignancy, possibly from metastatic gallbladder adenocarcinoma or hepatocellular carcinoma. 3. New multiple small pulmonary nodules in the lung bases, concerning for metastatic disease. 4. Mildly enlarged peripancreatic/porta hepatis lymph node. 5.  Trace ascites in the right paracolic gutter. 6. Prominent prostatomegaly. 7.  Aortic atherosclerosis. Aortic Atherosclerosis (ICD10-I70.0). Electronically Signed   By: Gaylyn Rong M.D.   On: 08/07/2022 15:51   DG Chest 2 View  Result Date: 08/07/2022 CLINICAL DATA:  Lung cancer follow-up EXAM: CHEST - 2 VIEW COMPARISON:  X-ray 08/06/2021 and older FINDINGS: Stable surgical changes along the lower medial right hemithorax. No consolidation, pneumothorax or effusion. No edema. Normal cardiopericardial silhouette. Degenerative changes of the spine. IMPRESSION: No acute cardiopulmonary disease. Electronically Signed   By: Karen Kays M.D.   On: 08/07/2022 13:24   DG Chest Port 1 View  Result Date: 08/07/2022 CLINICAL DATA:  Fatigue EXAM: PORTABLE CHEST 1 VIEW COMPARISON:  X-ray 08/05/2018 and older FINDINGS: Underinflation. Film is slightly rotated to the left. There is some linear opacity at the bases likely atelectasis. New from prior. No pneumothorax, effusion or edema. Overlapping cardiac leads. Stable cardiopericardial silhouette when adjusting for level of inflation. Surgical changes overlie the lower right chest. IMPRESSION: Underinflation with new presumed basilar atelectasis. Rotated radiograph Electronically Signed   By: Karen Kays M.D.   On: 08/07/2022 13:23    Microbiology: Results for orders placed or performed during the hospital encounter of 09/14/14  Surgical pcr screen     Status: None   Collection Time: 09/14/14  2:40 PM   Specimen: Nasal Mucosa; Nasal Swab  Result Value Ref Range Status   MRSA, PCR NEGATIVE NEGATIVE Final   Staphylococcus aureus NEGATIVE NEGATIVE Final    Comment:        The Xpert SA Assay (FDA approved for NASAL specimens in patients over 12 years of age), is one component of a comprehensive surveillance program.  Test performance has been validated by [facility] for patients greater than or equal to 52 year old. It is not intended to diagnose infection nor to guide or monitor treatment.     Labs: CBC: Recent Labs   Lab 08/05/22 1226 08/07/22 1245 08/08/22 0029 08/09/22 0109  WBC 11.7* 10.5 11.0* 11.5*  NEUTROABS 9.6* 8.5*  --   --   HGB 13.4 12.5* 12.0* 12.1*  HCT 41.4 38.7* 37.3* 37.1*  MCV 91.6 93.7 91.4 90.7  PLT 123.0* 102* 91* 116*   Basic Metabolic Panel: Recent Labs  Lab 08/05/22 1226 08/07/22 1245 08/08/22 0029 08/09/22 0109  NA 135 135 135 133*  K 4.2 3.9 4.3 4.0  CL 98 101 100 98  CO2 29 24 25 27   GLUCOSE 134* 161* 135* 103*  BUN 26* 30* 25* 24*  CREATININE 1.08 1.05 0.95 0.95  CALCIUM 9.2 8.4* 8.3* 8.1*   Liver Function Tests: Recent Labs  Lab 08/05/22 1226 08/07/22 1245 08/08/22 0029 08/09/22 0109  AST 63* 82* 85* 91*  ALT 47 53* 55* 61*  ALKPHOS 131* 129* 123 134*  BILITOT 1.2 0.9 1.0 1.1  PROT 6.9 5.9* 5.7* 5.6*  ALBUMIN 3.8 2.9* 2.7* 2.6*   CBG: No results for input(s): "GLUCAP" in the last 168 hours.  Discharge time spent: greater than 30 minutes.  Signed: Meredeth Ide, MD [facility] 08/09/2022

## 2022-08-10 ENCOUNTER — Other Ambulatory Visit: Payer: MEDICARE

## 2022-08-10 NOTE — Progress Notes (Signed)
I spoke with Drusilla Kanner in [facility] radiology and requested the following images be pused to [facility], 6/18 Ct AP, 6/15 MR Liver, 6/16 Ct C.

## 2022-08-11 ENCOUNTER — Telehealth: Payer: Self-pay | Admitting: *Deleted

## 2022-08-11 ENCOUNTER — Encounter: Payer: Self-pay | Admitting: *Deleted

## 2022-08-11 LAB — CEA: CEA: 538 ng/mL — ABNORMAL HIGH (ref 0.0–4.7)

## 2022-08-11 LAB — AFP TUMOR MARKER: AFP, Serum, Tumor Marker: 6.9 ng/mL — ABNORMAL HIGH (ref 0.0–6.4)

## 2022-08-11 LAB — CANCER ANTIGEN 19-9: CA 19-9: 19954 U/mL — ABNORMAL HIGH (ref 0–35)

## 2022-08-11 NOTE — Transitions of Care (Post Inpatient/ED Visit) (Signed)
   08/11/2022  Name: Bernard [name] MRN: [id number] DOB: 09/20/1932  Today's TOC FU Call Status: Today's TOC FU Call Status:: Unsuccessul Call (1st Attempt) Unsuccessful Call (1st Attempt) Date: 08/11/22  Attempted to reach the patient regarding the most recent Inpatient visit; left HIPAA compliant voice message requesting call back  Follow Up Plan: Additional outreach attempts will be made to reach the patient to complete the Transitions of Care (Post Inpatient visit) call.   Caryl Pina, RN, BSN, CCRN Alumnus RN CM Care Coordination/ Transition of Care- [facility] Care Management [phone]: direct office

## 2022-08-12 ENCOUNTER — Encounter: Payer: Self-pay | Admitting: *Deleted

## 2022-08-12 ENCOUNTER — Telehealth: Payer: Self-pay | Admitting: *Deleted

## 2022-08-12 NOTE — Transitions of Care (Post Inpatient/ED Visit) (Signed)
   08/12/2022  Name: Bernard [name] MRN: [id number] DOB: Jul 30, 1932  Today's TOC FU Call Status: Today's TOC FU Call Status:: Unsuccessful Call (2nd Attempt) Unsuccessful Call (2nd Attempt) Date: 08/12/22  Attempted to reach the patient regarding the most recent Inpatient visit; ; left HIPAA compliant voice message requesting call back  Follow Up Plan: Additional outreach attempts will be made to reach the patient to complete the Transitions of Care (Post Inpatient visit) call.   Caryl Pina, RN, BSN, CCRN Alumnus RN CM Care Coordination/ Transition of Care- [facility] Care Management [phone]: direct office

## 2022-08-13 ENCOUNTER — Telehealth: Payer: Self-pay | Admitting: *Deleted

## 2022-08-13 ENCOUNTER — Inpatient Hospital Stay: Payer: MEDICARE | Attending: Nurse Practitioner | Admitting: Nurse Practitioner

## 2022-08-13 ENCOUNTER — Other Ambulatory Visit: Payer: Self-pay

## 2022-08-13 ENCOUNTER — Encounter: Payer: Self-pay | Admitting: Nurse Practitioner

## 2022-08-13 ENCOUNTER — Inpatient Hospital Stay: Payer: MEDICARE

## 2022-08-13 ENCOUNTER — Encounter: Payer: Self-pay | Admitting: *Deleted

## 2022-08-13 VITALS — BP 100/55 | HR 109 | Temp 97.7°F | Resp 17 | Wt 201.5 lb

## 2022-08-13 DIAGNOSIS — I81 Portal vein thrombosis: Secondary | ICD-10-CM | POA: Diagnosis not present

## 2022-08-13 DIAGNOSIS — I480 Paroxysmal atrial fibrillation: Secondary | ICD-10-CM | POA: Diagnosis not present

## 2022-08-13 DIAGNOSIS — K219 Gastro-esophageal reflux disease without esophagitis: Secondary | ICD-10-CM | POA: Diagnosis not present

## 2022-08-13 DIAGNOSIS — R160 Hepatomegaly, not elsewhere classified: Secondary | ICD-10-CM

## 2022-08-13 DIAGNOSIS — E785 Hyperlipidemia, unspecified: Secondary | ICD-10-CM | POA: Insufficient documentation

## 2022-08-13 DIAGNOSIS — Z8673 Personal history of transient ischemic attack (TIA), and cerebral infarction without residual deficits: Secondary | ICD-10-CM | POA: Diagnosis not present

## 2022-08-13 DIAGNOSIS — Z79899 Other long term (current) drug therapy: Secondary | ICD-10-CM | POA: Diagnosis not present

## 2022-08-13 DIAGNOSIS — N400 Enlarged prostate without lower urinary tract symptoms: Secondary | ICD-10-CM | POA: Diagnosis not present

## 2022-08-13 DIAGNOSIS — R109 Unspecified abdominal pain: Secondary | ICD-10-CM | POA: Insufficient documentation

## 2022-08-13 DIAGNOSIS — C801 Malignant (primary) neoplasm, unspecified: Secondary | ICD-10-CM | POA: Diagnosis present

## 2022-08-13 DIAGNOSIS — Z7901 Long term (current) use of anticoagulants: Secondary | ICD-10-CM | POA: Insufficient documentation

## 2022-08-13 DIAGNOSIS — K5900 Constipation, unspecified: Secondary | ICD-10-CM | POA: Diagnosis not present

## 2022-08-13 DIAGNOSIS — I2510 Atherosclerotic heart disease of native coronary artery without angina pectoris: Secondary | ICD-10-CM | POA: Insufficient documentation

## 2022-08-13 DIAGNOSIS — I10 Essential (primary) hypertension: Secondary | ICD-10-CM | POA: Diagnosis not present

## 2022-08-13 LAB — CBC WITH DIFFERENTIAL/PLATELET
Abs Immature Granulocytes: 0.14 10*3/uL — ABNORMAL HIGH (ref 0.00–0.07)
Basophils Absolute: 0 10*3/uL (ref 0.0–0.1)
Basophils Relative: 0 %
Eosinophils Absolute: 0.1 10*3/uL (ref 0.0–0.5)
Eosinophils Relative: 1 %
HCT: 38.8 % — ABNORMAL LOW (ref 39.0–52.0)
Hemoglobin: 12.8 g/dL — ABNORMAL LOW (ref 13.0–17.0)
Immature Granulocytes: 1 %
Lymphocytes Relative: 8 %
Lymphs Abs: 1.2 10*3/uL (ref 0.7–4.0)
MCH: 29.7 pg (ref 26.0–34.0)
MCHC: 33 g/dL (ref 30.0–36.0)
MCV: 90 fL (ref 80.0–100.0)
Monocytes Absolute: 1.3 10*3/uL — ABNORMAL HIGH (ref 0.1–1.0)
Monocytes Relative: 9 %
Neutro Abs: 11.3 10*3/uL — ABNORMAL HIGH (ref 1.7–7.7)
Neutrophils Relative %: 81 %
Platelets: 124 10*3/uL — ABNORMAL LOW (ref 150–400)
RBC: 4.31 MIL/uL (ref 4.22–5.81)
RDW: 13.2 % (ref 11.5–15.5)
WBC: 14 10*3/uL — ABNORMAL HIGH (ref 4.0–10.5)
nRBC: 0 % (ref 0.0–0.2)

## 2022-08-13 LAB — PROTIME-INR
INR: 2.7 — ABNORMAL HIGH (ref 0.8–1.2)
Prothrombin Time: 28.9 seconds — ABNORMAL HIGH (ref 11.4–15.2)

## 2022-08-13 LAB — COMPREHENSIVE METABOLIC PANEL
ALT: 93 U/L — ABNORMAL HIGH (ref 0–44)
AST: 119 U/L — ABNORMAL HIGH (ref 15–41)
Albumin: 3.3 g/dL — ABNORMAL LOW (ref 3.5–5.0)
Alkaline Phosphatase: 274 U/L — ABNORMAL HIGH (ref 38–126)
Anion gap: 10 (ref 5–15)
BUN: 25 mg/dL — ABNORMAL HIGH (ref 8–23)
CO2: 26 mmol/L (ref 22–32)
Calcium: 8.9 mg/dL (ref 8.9–10.3)
Chloride: 99 mmol/L (ref 98–111)
Creatinine, Ser: 0.89 mg/dL (ref 0.61–1.24)
GFR, Estimated: >60 mL/min (ref >60)
Glucose, Bld: 117 mg/dL — ABNORMAL HIGH (ref 70–99)
Potassium: 4.2 mmol/L (ref 3.5–5.1)
Sodium: 135 mmol/L (ref 135–145)
Total Bilirubin: 1.3 mg/dL — ABNORMAL HIGH (ref 0.3–1.2)
Total Protein: 6.2 g/dL — ABNORMAL LOW (ref 6.5–8.1)

## 2022-08-13 NOTE — Progress Notes (Addendum)
Patient Care Team: Plotnikov, Georgina Quint, MD as PCP - General (Internal Medicine) Regan Lemming, MD as PCP - Electrophysiology (Cardiology) Nelson Chimes, MD (Ophthalmology) Ollen Gross, MD (Orthopedic Surgery) Si Gaul, MD as Consulting Physician (Oncology) Hillis Range, MD (Inactive) as Consulting Physician (Cardiology) Heloise Purpura, MD as Consulting Physician (Urology) Malachy Mood, MD as Consulting Physician (Oncology)   CHIEF COMPLAINT: Hospital follow-up, first outpatient follow-up likely gallbladder vs bile duct cancer  HPI: Patient presented to ED 08/07/2022 with worsening abdominal pain and weight loss.  CT AP showed irregular masslike wall thickening of the gallbladder and large right lobe liver mass with associated portal vein thrombosis.  Liver MRI showed multiple hypervascular masses in the right and left lobe, largest measuring 11 cm which is contiguous with the gallbladder and several enhancing soft tissue densities in the edge of the gallbladder fundus.  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  He declined a liver biopsy.  Seen by general surgeon Dr. Freida Busman in the hospital who feels this is not resectable disease.  Discharge 08/09/2022 with outpatient follow-up  CURRENT THERAPY: Further workup  INTERVAL HISTORY Bernard [name] presents in a wheelchair, accompanied by his son. He is not feeling well. Feels sore all over, with intermittent right abdominal pain currently 5/10. Last night he was 10/10, never gets below a 5. Takes norco 2 to 3 times a day. This helps some. Had a big BM when he left the hospital and 1 since, waiting to fill senakot. Denies n/v. Eating and drinking best he can, baseline is 2 meals and more snacks per day. Son is getting him up to move/walk daily and he rode tractor yesterday. Denies fever/chills, cough, chest pain, dyspnea, leg edema.   ROS  All other  systems reviewed and negative   Past Medical History:  Diagnosis Date   CAD (coronary artery disease)    s/p MI 1976, normal myoview 7/11   Depressive disorder, not elsewhere classified    Esophageal reflux    Headache    migraines occ., none in months.   History of BPH    benign prostatic hypertrophy   Labyrinthitis, unspecified    Malignant neoplasm middle lobe, bronchus or lung    right lung. Dx 07/27/08, sp/p resection 7/10.    Osteoarthrosis, unspecified whether generalized or localized, lower leg    knee   Other and unspecified hyperlipidemia    Paroxysmal atrial fibrillation (HCC)    postoperative after lung surgery   Renal cyst, right    being followed   Stroke [facility])    TIA March 2000     no side effects now   Unspecified essential hypertension      Past Surgical History:  Procedure Laterality Date   anthroscopic surgery  '10   right knee \   BUNIONECTOMY  '89   left foot   CATARACT EXTRACTION, BILATERAL     ESOPHAGOGASTRODUODENOSCOPY  05/14/04   EYE SURGERY     both eyes - 2011 ([place])   INGUINAL HERNIA REPAIR  '71   left   right TKR  8/11   TOTAL KNEE ARTHROPLASTY Left 09/24/2014   Procedure: LEFT TOTAL KNEE ARTHROPLASTY;  Surgeon: Ollen Gross, MD;  Location: [facility];  Service: Orthopedics;  Laterality: Left;   VAT-RML lobectomy       Outpatient Encounter Medications as of 08/13/2022  Medication Sig   cholecalciferol (VITAMIN D) 1000 UNITS tablet Take 1,000 Units by mouth daily.  Take 1 tablet at 6 pm.   finasteride (PROSCAR) 5 MG tablet Take 1 tablet (5 mg total) by mouth daily. (Patient taking differently: Take 5 mg by mouth daily. Take 1 tablet at 6 pm)   HYDROcodone-acetaminophen (NORCO/VICODIN) 5-325 MG tablet Take 1 tablet by mouth every 6 (six) hours as needed for severe pain.   multivitamin-lutein (OCUVITE-LUTEIN) CAPS Take 1 capsule by mouth daily.   ondansetron (ZOFRAN) 4 MG tablet Take 1 tablet (4 mg total) by mouth every 8 (eight) hours as needed for  nausea or vomiting.   Polyvinyl Alcohol-Povidone (REFRESH OP) Apply 2 drops to eye 2 (two) times daily.   senna-docusate (SENOKOT S) 8.6-50 MG tablet Take 1 tablet by mouth 2 (two) times daily. Take for constipattion   terazosin (HYTRIN) 5 MG capsule Take 1 capsule (5 mg total) by mouth daily. (Patient taking differently: Take 5 mg by mouth every other day. Take every other night at 6 pm.)   warfarin (COUMADIN) 5 MG tablet Take 1 tablet daily or Take as directed by anticoagulation clinic. (Patient taking differently: Take 2.5 mg by mouth daily at 6 PM. Take 1 tablet by mouth on Monday at 6 pm, and one-half pill on other days at 6pm *Subject to change*)   White Petrolatum-Mineral Oil (GENTEAL TEARS NIGHT-TIME OP) Apply 1 Dose to eye at bedtime.   No facility-administered encounter medications on file as of 08/13/2022.     Today's Vitals   08/13/22 1241  BP: (!) 100/55  Pulse: (!) 109  Resp: 17  Temp: 97.7 F (36.5 C)  TempSrc: Temporal  SpO2: 97%  Weight: 201 lb 8 oz (91.4 kg)   Body mass index is 28.91 kg/m.   PHYSICAL EXAM GENERAL:alert, no distress and comfortable SKIN: no rash  EYES: sclera clear NECK: without mass LYMPH:  no palpable cervical or supraclavicular lymphadenopathy  LUNGS: clear with normal breathing effort HEART: Afib, regular rate; no lower extremity edema ABDOMEN: abdomen distended but soft, non-tender and normal bowel sounds NEURO: alert & oriented x 3 with fluent speech, exam performed in wheelchair     CBC    Component Value Date/Time   WBC 14.0 (H) 08/13/2022 1334   RBC 4.31 08/13/2022 1334   HGB 12.8 (L) 08/13/2022 1334   HGB 13.5 09/05/2015 1117   HCT 38.8 (L) 08/13/2022 1334   HCT 40.6 09/05/2015 1117   PLT 124 (L) 08/13/2022 1334   PLT 142 09/05/2015 1117   MCV 90.0 08/13/2022 1334   MCV 91.4 09/05/2015 1117   MCH 29.7 08/13/2022 1334   MCHC 33.0 08/13/2022 1334   RDW 13.2 08/13/2022 1334   RDW 14.3 09/05/2015 1117   LYMPHSABS 1.2  08/13/2022 1334   LYMPHSABS 2.6 09/05/2015 1117   MONOABS 1.3 (H) 08/13/2022 1334   MONOABS 0.6 09/05/2015 1117   EOSABS 0.1 08/13/2022 1334   EOSABS 0.1 09/05/2015 1117   BASOSABS 0.0 08/13/2022 1334   BASOSABS 0.0 09/05/2015 1117     CMP     Component Value Date/Time   NA 135 08/13/2022 1334   NA 144 09/05/2015 1117   K 4.2 08/13/2022 1334   K 4.3 09/05/2015 1117   CL 99 08/13/2022 1334   CL 103 09/29/2011 1218   CO2 26 08/13/2022 1334   CO2 26 09/05/2015 1117   GLUCOSE 117 (H) 08/13/2022 1334   GLUCOSE 92 09/05/2015 1117   GLUCOSE 96 09/29/2011 1218   BUN 25 (H) 08/13/2022 1334   BUN 19.3 09/05/2015 1117   CREATININE  0.89 08/13/2022 1334   CREATININE 1.1 09/05/2015 1117   CALCIUM 8.9 08/13/2022 1334   CALCIUM 8.8 09/05/2015 1117   PROT 6.2 (L) 08/13/2022 1334   PROT 6.8 09/05/2015 1117   ALBUMIN 3.3 (L) 08/13/2022 1334   ALBUMIN 3.8 09/05/2015 1117   AST 119 (H) 08/13/2022 1334   AST 23 09/05/2015 1117   ALT 93 (H) 08/13/2022 1334   ALT 17 09/05/2015 1117   ALKPHOS 274 (H) 08/13/2022 1334   ALKPHOS 46 09/05/2015 1117   BILITOT 1.3 (H) 08/13/2022 1334   BILITOT 0.54 09/05/2015 1117   GFRNONAA >60 08/13/2022 1334   GFRAA >60 09/26/2014 0410     ASSESSMENT & PLAN:89 year old gentleman with remote history of lung cancer, atrial fibrillation on Coumadin, BPH, hypertension, presented with worsening right upper quadrant abdominal pain   Large liver mass with liver and possible lung metastasis, likely gallbladder vs bile duct primary -Reviewed his hospital course with the patient and his son, presented with abdominal pain and weight loss, work up showed liver mass appearing to originate from gallbladder, with liver metastasis, and multiple bilateral lung nodules some of which are concerning for metastases.  -CEA and CA 19-9 elevated -He declined liver biopsy  -He was seen by surgeon Dr. Freida Busman in the hospital, unfortunately due to the probable metastatic disease, he is  not a surgical candidate.  -Bernard [name] appears weak, with low PS and persistent partially managed abdominal pain. We reviewed symptom management.  -Due to lack of tissue biopsy, his advanced age, and preference he is not a candidate for cytotoxic chemotherapy.  -He would like to proceed with labs and Guardant 360 to see if he has targetable treatment or immunotherapy option.  -He understands this is not very likely, and agrees to start hospice referral in the meantime. If result comes back with treatment option, he will un-enroll in hospice.  -his son may want to pursue a second opinion, gave info on how to request radiology disc -He has rising LFTs and new hyperbilirubinemia since hospital discharge, likely due to large liver mass. Ca 19-9 is pending. -Phone F/up in 2 weeks with Guardant 360 results    Abdominal pain -Secondary to #1 and possibly some component of portal vein thrombosis  -Discharged on Norco 5-325 mg 1 q6 hours, pain is moderate -He can take 1-2 tabs q6 hours until hospice care takes over. We discussed constipation management    Atrial fibrillation on Coumadin PT-INR today, he will send to coumadin clinic   Hypertension -BP softer lately, low threshold to hold meds, especially if he continues to lose weight, low po intake, and/or becomes symptomatic      PLAN: -Hospital course reviewed -Symptom management for pain and constipation  -Lab today, including Guardant 360, will call with results -Referral to hospice, if molecular testing reveals a treatment option, he can un-enroll  -Phone f/up 2 weeks  -Patient seen with Dr. Mosetta Putt   Orders Placed This Encounter  Procedures   Cancer antigen 19-9    Standing Status:   Future    Number of Occurrences:   1    Standing Expiration Date:   08/13/2023   CBC with Differential/Platelet    Standing Status:   Future    Number of Occurrences:   1    Standing Expiration Date:   08/13/2023   Comprehensive metabolic panel     Standing Status:   Future    Number of Occurrences:   1    Standing Expiration Date:  08/13/2023   Guardant 360    Standing Status:   Future    Number of Occurrences:   1    Standing Expiration Date:   08/13/2023   Protime-INR    Standing Status:   Future    Number of Occurrences:   1    Standing Expiration Date:   08/13/2023   Ambulatory referral to Hospice    Referral Priority:   Routine    Referral Type:   Consultation    Referral Reason:   Symptom Managment    Requested Specialty:   Hospice Services    Number of Visits Requested:   1      All questions were answered. The patient knows to call the clinic with any problems, questions or concerns. No barriers to learning were detected.   [name], NP-C 08/13/2022    Addendum  I initially saw Bernard [name] in the hospital, and he is here with his son for follow-up.  I personally reviewed his CT chest scan images with patient and his son, which unfortunately showed pulmonary metastasis.  His metastatic gallbladder cancer is not curable, and he is quite symptomatic from malignancy, with fatigue and low performance status.  He is not a candidate for chemotherapy due to his advanced age and low PS.  He is interested to know if he is a candidate for immunotherapy or targeted therapy.  Will proceed Guardant360 today.  I recommend home hospice care, I discussed the logistics and benefit with patient and his son.  He is interested.  Urgent referral to [facility] [place] was made I answered all his questions.  I will be happy to be his attending when he is under hospice care.  Malachy Mood MD 08/13/2022

## 2022-08-13 NOTE — Transitions of Care (Post Inpatient/ED Visit) (Signed)
08/13/2022  Name: Bernard [name] MRN: [id number] DOB: 1932-11-13  Today's TOC FU Call Status: Today's TOC FU Call Status:: Successful TOC FU Call Competed TOC FU Call Complete Date: 08/13/22  Transition Care Management Follow-up Telephone Call Date of Discharge: 08/09/22 Discharge Facility: [facility]) Type of Discharge: Inpatient Admission Primary Inpatient Discharge Diagnosis:: Liver mass; portal vein thrombosis How have you been since you were released from the hospital?:  (Per son: "He does not feel like  talking; we just got home from the cancer Center and they told us that they would be sending hospice out tomorrow.  Things are overall okay, but he doesn't feel like talking now") Any questions or concerns?: No  Items Reviewed: Did you receive and understand the discharge instructions provided?: No (declined full TOC call) Medications obtained,verified, and reconciled?: No Medications Not Reviewed Reasons:: Other: (patient declined full TOC call) Any new allergies since your discharge?: No Dietary orders reviewed?: No Do you have support at home?: Yes Name of Support/Comfort Primary Source: Son reports he is with patient  Medications Reviewed Today: Medications Reviewed Today     Reviewed by Michaela Corner, RN (Registered Nurse) on 08/13/22 at 1545  Med List Status: <None>   Medication Order Taking? Sig Documenting Provider Last Dose Status Informant  cholecalciferol (VITAMIN D) 1000 UNITS tablet [id number] No Take 1,000 Units by mouth daily. Take 1 tablet at 6 pm. [provider] 08/06/2022 Active Family Member  finasteride (PROSCAR) 5 MG tablet [id number] No Take 1 tablet (5 mg total) by mouth daily.  Patient taking differently: Take 5 mg by mouth daily. Take 1 tablet at 6 pm   Plotnikov, Georgina Quint, MD 08/06/2022 Active Family Member  HYDROcodone-acetaminophen (NORCO/VICODIN) 5-325 MG tablet [id number]  Take 1 tablet by mouth every 6 (six) hours as needed for  severe pain. Meredeth Ide, MD  Active   multivitamin-lutein [facility]) CAPS [id number] No Take 1 capsule by mouth daily. [provider] 08/07/2022 Active Spouse/Significant Other  ondansetron (ZOFRAN) 4 MG tablet [id number] No Take 1 tablet (4 mg total) by mouth every 8 (eight) hours as needed for nausea or vomiting. Plotnikov, Georgina Quint, MD unknown Active   Polyvinyl Alcohol-Povidone (REFRESH OP) [id number] No Apply 2 drops to eye 2 (two) times daily. [provider] 08/07/2022 Active Family Member  senna-docusate (SENOKOT S) 8.6-50 MG tablet [id number]  Take 1 tablet by mouth 2 (two) times daily. Take for constipattion Meredeth Ide, MD  Active   terazosin (HYTRIN) 5 MG capsule [id number] No Take 1 capsule (5 mg total) by mouth daily.  Patient taking differently: Take 5 mg by mouth every other day. Take every other night at 6 pm.   Plotnikov, Georgina Quint, MD 08/06/2022 Active Family Member  warfarin (COUMADIN) 5 MG tablet [id number] No Take 1 tablet daily or Take as directed by anticoagulation clinic.  Patient taking differently: Take 2.5 mg by mouth daily at 6 PM. Take 1 tablet by mouth on Monday at 6 pm, and one-half pill on other days at 6pm *Subject to change*   Plotnikov, Georgina Quint, MD 08/06/2022 18:00 Active Family Member  White Petrolatum-Mineral Oil (GENTEAL TEARS NIGHT-TIME OP) [id number]  Apply 1 Dose to eye at bedtime. [provider]  Active Self, Family Member           Home Care and Equipment/Supplies: Were Home Health Services Ordered?: No Any new equipment or medical supplies ordered?: No  Functional Questionnaire:   Follow up appointments reviewed: PCP Follow-up  appointment confirmed?: Yes Date of PCP follow-up appointment?: 08/19/22 Follow-up Provider: PCP Specialist Hospital Follow-up appointment confirmed?: Yes Date of Specialist follow-up appointment?: 08/13/22 Follow-Up Specialty Provider:: oncology provider- new patient appointment:  confirmed patient attended as scheduled Do you need transportation to your follow-up appointment?: No Do you understand care options if your condition(s) worsen?: Yes-patient verbalized understanding  SDOH Interventions Today    Flowsheet Row Most Recent Value  SDOH Interventions   Food Insecurity Interventions Patient Declined  Transportation Interventions Patient Declined      TOC Interventions Today    Flowsheet Row Most Recent Value  TOC Interventions   TOC Interventions Discussed/Reviewed TOC Interventions Discussed  [brief emotional support provided as patient reports he is now under hospice care as of today's oncology provider appointment]      Interventions Today    Flowsheet Row Most Recent Value  Chronic Disease   Chronic disease during today's visit Other  [Liver mass]  General Interventions   General Interventions Discussed/Reviewed General Interventions Discussed, Doctor Visits  Doctor Visits Discussed/Reviewed Doctor Visits Discussed, Specialist, Doctor Visits Reviewed, PCP  PCP/Specialist Visits Compliance with follow-up visit      Caryl Pina, RN, BSN, CCRN Alumnus RN CM Care Coordination/ Transition of Care- [facility] Care Management [phone]: direct office

## 2022-08-13 NOTE — Progress Notes (Signed)
I met with Bernard [name] and his son, Bernard [name] after his consultation with [name], NP and  Dr Mosetta Putt.  I explained my role as a nurse navigator and provided my contact information.

## 2022-08-14 ENCOUNTER — Telehealth: Payer: Self-pay

## 2022-08-14 ENCOUNTER — Ambulatory Visit (INDEPENDENT_AMBULATORY_CARE_PROVIDER_SITE_OTHER): Payer: MEDICARE

## 2022-08-14 ENCOUNTER — Telehealth: Payer: Self-pay | Admitting: Hematology

## 2022-08-14 DIAGNOSIS — Z7901 Long term (current) use of anticoagulants: Secondary | ICD-10-CM

## 2022-08-14 NOTE — Progress Notes (Signed)
Lab drawn in oncology yesterday. Recently diagnosed with liver/gallbladder cancer. In hospital on 6/14. Continue 1/2 tablet daily. Pt will let coumadin clinic know when he has HH/Hospice nurse and they can check INR. Contacted pt by phone and advised. Pt verbalized understanding.

## 2022-08-14 NOTE — Progress Notes (Signed)
I spoke with Drusilla Kanner at [facility] radiology department.  I requested the following images be copied to a disc: 08/07/2022 CT AB, 08/08/2022 MR liver, and 08/09/2022 CT C.  She told me the disc will be available for pick up this afternoon in the [facility] admissions office.  I spoke with Mr Laumann's son, [name] and relayed this information.  All questions were answered.  He verbalized understanding.

## 2022-08-14 NOTE — Telephone Encounter (Signed)
Pt reports he was in the hospital for abdominal pain on 6/14 and has been diagnosed with gallbladder/liver cancer. He reports they are discussing hospice and he had an apt with oncology yesterday and they drew labs, including an INR. He is uncertain of the result because he cannot see it on mychart. INR result is 2.7  Pt reports his INR was elevated at last coumadin clinic apt, he remembered due to eating a lot of strawberries.   Advised pt to continue dosing instructions from last coumadin clinic apt. Pt requested apt for next coumadin clinic to be cancelled. He thinks he will have HH/Hospice nurses coming in and they will check INR. Advised pt to let nurse know to call result to the coumadin clinic. Cancelled next coumadin clinic apt. Advised if anything is needed to not hesitate to contact the coumadin clinic. Pt verbalized understanding.

## 2022-08-14 NOTE — Patient Instructions (Addendum)
Pre visit review using our clinic review tool, if applicable. No additional management support is needed unless otherwise documented below in the visit note.  Continue 1/2 tablet daily. Pt will let coumadin clinic know when he has HH/Hospice nurse and they can check INR.

## 2022-08-18 ENCOUNTER — Ambulatory Visit: Payer: MEDICARE

## 2022-08-18 LAB — CANCER ANTIGEN 19-9: CA 19-9: 33049 U/mL — ABNORMAL HIGH (ref 0–35)

## 2022-08-19 ENCOUNTER — Telehealth: Payer: Self-pay | Admitting: Internal Medicine

## 2022-08-19 ENCOUNTER — Encounter: Payer: Self-pay | Admitting: Internal Medicine

## 2022-08-19 ENCOUNTER — Telehealth (INDEPENDENT_AMBULATORY_CARE_PROVIDER_SITE_OTHER): Payer: MEDICARE | Admitting: Internal Medicine

## 2022-08-19 DIAGNOSIS — R609 Edema, unspecified: Secondary | ICD-10-CM | POA: Diagnosis not present

## 2022-08-19 DIAGNOSIS — I81 Portal vein thrombosis: Secondary | ICD-10-CM

## 2022-08-19 DIAGNOSIS — Z7901 Long term (current) use of anticoagulants: Secondary | ICD-10-CM

## 2022-08-19 DIAGNOSIS — K5904 Chronic idiopathic constipation: Secondary | ICD-10-CM | POA: Diagnosis not present

## 2022-08-19 DIAGNOSIS — R160 Hepatomegaly, not elsewhere classified: Secondary | ICD-10-CM

## 2022-08-19 DIAGNOSIS — K5900 Constipation, unspecified: Secondary | ICD-10-CM | POA: Insufficient documentation

## 2022-08-19 MED ORDER — HYDROCODONE-ACETAMINOPHEN 5-325 MG PO TABS: 1.0000 | ORAL_TABLET | Freq: Four times a day (QID) | ORAL | 0 refills | Status: DC | PRN

## 2022-08-19 MED ORDER — TORSEMIDE 20 MG PO TABS: 20.0000 mg | ORAL_TABLET | Freq: Every day | ORAL | 3 refills | Status: DC | PRN

## 2022-08-19 NOTE — Telephone Encounter (Signed)
Called pharmacist back gave her Liver Mass, Carcinoma of right lung . Pharmacist states MD may want to look at frequency again due to the Acetaminophen can mess with Liver..Gordan Payment

## 2022-08-19 NOTE — Telephone Encounter (Signed)
Patient has a virtual appointment scheduled for this morning at 10:40. They are waiting for the provider to join. Spoke with CMS and they are going to speak with Dr. Posey Rea. Patient's son [name] would like a call back at [phone].

## 2022-08-19 NOTE — Assessment & Plan Note (Signed)
Start Demadex 20-40 mg/d prn Compression socks On Coumadin

## 2022-08-19 NOTE — Telephone Encounter (Signed)
A pharmacist from [facility] called and said they need a diagnosis code for patient's new prescription of HYDROcodone-acetaminophen (NORCO/VICODIN) 5-325 MG tablet . They would like a call back at [phone].

## 2022-08-19 NOTE — Progress Notes (Signed)
Virtual Visit via Video Note  I connected with Bernard [name] on 08/19/22 at 10:40 AM EDT by a video enabled telemedicine application and verified that I am speaking with the correct person using two identifiers.   I discussed the limitations of evaluation and management by telemedicine and the availability of in person appointments. The patient expressed understanding and agreed to proceed.  I was located at our [facility] office. The patient was at home. Son Bernard [name] was present in the visit.  Chief Complaint  Patient presents with   Follow-up    2 WEEK F/U    Pt is at home w/his son Bernard [name]. S/p recent hospital stay. C/o abd pain, liver mass, constipation and LE swelling...  History of Present Illness:  Per hx:  " Liver mass/portal vein thrombosis/cholangiocarcinoma -Likely from hypercoagulable state with hepatic/gallbladder mass -Coumadin was held yesterday, patient started on heparin -General surgery was consulted; unclear if this is primary gallbladder malignancy versus intrahepatic cholangiocarcinoma -Given portal vein involvement and question of metastatic disease, this is not resectable -Tumor markers ordered; MRI liver showed multiple hypervascular masses in the right and left hepatic lobes largest measuring 11 cm.  Finding consistent with liver metastasis due to lung carcinoma or primary gallbladder carcinoma.  Segmental portal vein thrombosis in the anterior segment of the right hepatic lobe without enhancement suggesting began thrombus rather than tumor thrombus. -Patient has refused liver biopsy" Review of Systems  Constitutional:  Positive for weight loss. Negative for fever.  Respiratory:  Negative for cough and hemoptysis.   Cardiovascular:  Positive for leg swelling. Negative for chest pain.  Gastrointestinal:  Positive for abdominal pain, constipation and nausea. Negative for blood in stool and diarrhea.  Genitourinary:  Negative for dysuria and frequency.   Musculoskeletal:  Positive for back pain.  Skin:  Negative for itching and rash.  Psychiatric/Behavioral:  Negative for depression, memory loss and substance abuse.      Observations/Objective: The patient appears to be in no acute distress Edema 1+ B LEs  Assessment and Plan:  Problem List Items Addressed This Visit     Long term (current) use of anticoagulants    Cont on Coumadin      Liver mass - Primary    New liver mass/portal vein thrombosis/cholangiocarcinoma -Likely from hypercoagulable state with hepatic/gallbladder mass -Coumadin was held yesterday, patient started on heparin -General surgery was consulted; unclear if this is primary gallbladder malignancy versus intrahepatic cholangiocarcinoma -Given portal vein involvement and question of metastatic disease, this is not resectable -Tumor markers ordered; MRI liver showed multiple hypervascular masses in the right and left hepatic lobes largest measuring 11 cm.  Finding consistent with liver metastasis due to lung carcinoma or primary gallbladder carcinoma.  Segmental portal vein thrombosis in the anterior segment of the right hepatic lobe without enhancement suggesting began thrombus rather than tumor thrombus. -Patient has refused liver biopsy  2nd opinion at [facility] was requested Hospice referral is on file Pain control: Norco prn  Potential benefits of a long term opioids use as well as potential risks (i.e. addiction risk, apnea etc) and complications (i.e. Somnolence, constipation and others) were explained to the patient and were aknowledged.       Relevant Orders   Ambulatory referral to Hematology / Oncology   Portal vein thrombosis    On Coumadin      Relevant Medications   torsemide (DEMADEX) 20 MG tablet   Constipation    On opioids D/ Simvastatin Miralax + Senakot daily  Edema    Start Demadex 20-40 mg/d prn Compression socks On Coumadin        Meds ordered this encounter   Medications   HYDROcodone-acetaminophen (NORCO/VICODIN) 5-325 MG tablet    Sig: Take 1 tablet by mouth every 6 (six) hours as needed for severe pain.    Dispense:  120 tablet    Refill:  0   torsemide (DEMADEX) 20 MG tablet    Sig: Take 1-2 tablets (20-40 mg total) by mouth daily as needed.    Dispense:  60 tablet    Refill:  3     Follow Up Instructions:    I discussed the assessment and treatment plan with the patient. The patient was provided an opportunity to ask questions and all were answered. The patient agreed with the plan and demonstrated an understanding of the instructions.   The patient was advised to call back or seek an in-person evaluation if the symptoms worsen or if the condition fails to improve as anticipated.  I provided face-to-face time during this encounter. We were at different locations.   Sonda Primes, MD

## 2022-08-19 NOTE — Assessment & Plan Note (Signed)
Cont on Coumadin °

## 2022-08-19 NOTE — Assessment & Plan Note (Signed)
On opioids D/ Simvastatin Miralax + Senakot daily

## 2022-08-19 NOTE — Assessment & Plan Note (Addendum)
New liver mass/portal vein thrombosis/cholangiocarcinoma -Likely from hypercoagulable state with hepatic/gallbladder mass -Coumadin was held yesterday, patient started on heparin -General surgery was consulted; unclear if this is primary gallbladder malignancy versus intrahepatic cholangiocarcinoma -Given portal vein involvement and question of metastatic disease, this is not resectable -Tumor markers ordered; MRI liver showed multiple hypervascular masses in the right and left hepatic lobes largest measuring 11 cm.  Finding consistent with liver metastasis due to lung carcinoma or primary gallbladder carcinoma.  Segmental portal vein thrombosis in the anterior segment of the right hepatic lobe without enhancement suggesting began thrombus rather than tumor thrombus. -Patient has refused liver biopsy  2nd opinion at [facility] was requested Hospice referral is on file Pain control: Norco prn  Potential benefits of a long term opioids use as well as potential risks (i.e. addiction risk, apnea etc) and complications (i.e. Somnolence, constipation and others) were explained to the patient and were aknowledged.

## 2022-08-19 NOTE — Assessment & Plan Note (Signed)
On Coumadin 

## 2022-08-19 NOTE — Telephone Encounter (Signed)
Pt was join by Dr. Posey Rea 08/19/2022 12:59 PM../lmb

## 2022-08-20 NOTE — Telephone Encounter (Signed)
Noted The pt is taking it bid w/an option to use up to QID on a bad day Thx

## 2022-08-25 ENCOUNTER — Telehealth: Payer: Self-pay | Admitting: Internal Medicine

## 2022-08-25 NOTE — Telephone Encounter (Signed)
Called [facility] # that was given was actually the wrong #, but she states she reach out to provider via epic and he states he will be the attending.Marland KitchenRaechel Chute

## 2022-08-25 NOTE — Telephone Encounter (Signed)
Call [name] back no answer and no vm. Will call back.Marland KitchenRaechel [name]

## 2022-08-25 NOTE — Telephone Encounter (Signed)
[name] form [facility] wanting to speak with the attending Doctor for pt to be sign off into hospice.  Best call back is [phone]

## 2022-08-26 NOTE — Progress Notes (Signed)
I received a call from [name], Bernard [name]'s daughter in law.  She states he has been getting weaker and this am she said "he had a seizure.  His arms were shaking and he would not respond".  I returned her call.  I advised her to call 911.  At the time of my call a home health agency was there assessing him.  Hospice will be admitting him tomorrow.  I told her there is not anything we can do over the phone or here at the cancer center.  Again I advised her to call 911.  All questions were answered.  She verbalized understanding.

## 2022-08-28 ENCOUNTER — Encounter: Payer: Self-pay | Admitting: Nurse Practitioner

## 2022-08-29 ENCOUNTER — Telehealth: Payer: Self-pay | Admitting: Family Medicine

## 2022-08-30 DIAGNOSIS — C23 Malignant neoplasm of gallbladder: Secondary | ICD-10-CM | POA: Insufficient documentation

## 2022-08-30 NOTE — Assessment & Plan Note (Signed)
Large liver mass with liver and possible lung metastasis, likely gallbladder vs bile duct primary -his CT scans showed liver mass appearing to originate from gallbladder, with liver metastasis, and multiple bilateral lung nodules some of which are concerning for metastases.  -CEA and CA 19-9 elevated -He declined liver biopsy  -Due to lack of tissue biopsy, his advanced age, low PS and preference he is not a candidate for cytotoxic chemotherapy.  -I reviewed Guardant360 liquid biopsy results, which showed multiple mutations, but unfortunately nothing targetable.  He is not a candidate for immunotherapy based on the test. -Patient has enrolled to hospice, will continue home hospice.

## 2022-08-31 ENCOUNTER — Telehealth: Payer: Self-pay | Admitting: Internal Medicine

## 2022-08-31 ENCOUNTER — Inpatient Hospital Stay: Payer: MEDICARE | Attending: Hematology | Admitting: Hematology

## 2022-08-31 DIAGNOSIS — C23 Malignant neoplasm of gallbladder: Secondary | ICD-10-CM

## 2022-08-31 LAB — GUARDANT 360

## 2022-08-31 NOTE — Telephone Encounter (Signed)
Bernard [name] with hospice care called to inform us that patient passed away on 2022-09-18 09-04-2022. States it was a quick transition. She can be reached at [phone].

## 2022-08-31 NOTE — Telephone Encounter (Signed)
Documented decease in chart.Marland KitchenRaechel [name]

## 2022-08-31 NOTE — Progress Notes (Signed)
Oncology follow up note  I called patient to review his Guardant360 results.  Patient's son answered the phone, and inform me that the patient passed away 3 days ago.  He was under home hospice care.  I discussed his Guardant360 results with patient, no targeted therapy available.  Some of the gene mutation has very high percentage in blood, will check with our genetic counselor to see if those are germline mutation, and if his children need to be tested. I discussed that with his son.  Malachy Mood  08/31/2022

## 2022-09-24 NOTE — Telephone Encounter (Signed)
Received after hours call around 0245 to notify our office that pt passed away at home under hospice care. Time of death 0137.

## 2022-09-24 DEATH — deceased
